# Patient Record
Sex: Female | Born: 1935 | Race: White | Hispanic: No | Smoking: Never smoker
Health system: Southern US, Community
[De-identification: ages and names within clinical notes are randomized; demographics above are authoritative.]

## PROBLEM LIST (undated history)

## (undated) DIAGNOSIS — R0609 Other forms of dyspnea: Secondary | ICD-10-CM

## (undated) DIAGNOSIS — I10 Essential (primary) hypertension: Secondary | ICD-10-CM

## (undated) DIAGNOSIS — I509 Heart failure, unspecified: Secondary | ICD-10-CM

## (undated) HISTORY — PX: APPENDECTOMY: SHX54

## (undated) HISTORY — PX: ABDOMINAL HYSTERECTOMY: SHX81

## (undated) HISTORY — DX: Other forms of dyspnea: R06.09

---

## 1998-01-25 ENCOUNTER — Ambulatory Visit (HOSPITAL_COMMUNITY): Admission: RE | Admit: 1998-01-25 | Discharge: 1998-01-25 | Payer: Self-pay

## 1999-01-31 ENCOUNTER — Ambulatory Visit (HOSPITAL_COMMUNITY): Admission: RE | Admit: 1999-01-31 | Discharge: 1999-01-31 | Payer: Self-pay

## 2000-02-20 ENCOUNTER — Ambulatory Visit (HOSPITAL_COMMUNITY): Admission: RE | Admit: 2000-02-20 | Discharge: 2000-02-20 | Payer: Self-pay | Admitting: Obstetrics & Gynecology

## 2001-02-25 ENCOUNTER — Ambulatory Visit (HOSPITAL_COMMUNITY): Admission: RE | Admit: 2001-02-25 | Discharge: 2001-02-25 | Payer: Self-pay | Admitting: Diagnostic Radiology

## 2001-02-25 ENCOUNTER — Encounter: Payer: Self-pay | Admitting: Internal Medicine

## 2002-03-10 ENCOUNTER — Ambulatory Visit (HOSPITAL_COMMUNITY): Admission: RE | Admit: 2002-03-10 | Discharge: 2002-03-10 | Payer: Self-pay

## 2003-03-16 ENCOUNTER — Encounter: Payer: Self-pay | Admitting: Internal Medicine

## 2003-03-16 ENCOUNTER — Ambulatory Visit (HOSPITAL_COMMUNITY): Admission: RE | Admit: 2003-03-16 | Discharge: 2003-03-16 | Payer: Self-pay | Admitting: *Deleted

## 2015-03-06 ENCOUNTER — Inpatient Hospital Stay (HOSPITAL_COMMUNITY)
Admission: EM | Admit: 2015-03-06 | Discharge: 2015-03-10 | DRG: 291 | Disposition: A | Discharge status: Skilled Nursing Facility | Payer: Medicare HMO | Attending: Internal Medicine | Admitting: Internal Medicine

## 2015-03-06 ENCOUNTER — Encounter (HOSPITAL_COMMUNITY): Payer: Self-pay | Admitting: Emergency Medicine

## 2015-03-06 ENCOUNTER — Emergency Department (HOSPITAL_COMMUNITY): Payer: Medicare HMO

## 2015-03-06 DIAGNOSIS — Z90710 Acquired absence of both cervix and uterus: Secondary | ICD-10-CM | POA: Diagnosis not present

## 2015-03-06 DIAGNOSIS — I129 Hypertensive chronic kidney disease with stage 1 through stage 4 chronic kidney disease, or unspecified chronic kidney disease: Secondary | ICD-10-CM | POA: Diagnosis present

## 2015-03-06 DIAGNOSIS — I340 Nonrheumatic mitral (valve) insufficiency: Secondary | ICD-10-CM | POA: Diagnosis present

## 2015-03-06 DIAGNOSIS — I5031 Acute diastolic (congestive) heart failure: Secondary | ICD-10-CM | POA: Diagnosis not present

## 2015-03-06 DIAGNOSIS — N179 Acute kidney failure, unspecified: Secondary | ICD-10-CM | POA: Diagnosis not present

## 2015-03-06 DIAGNOSIS — J9601 Acute respiratory failure with hypoxia: Secondary | ICD-10-CM | POA: Diagnosis present

## 2015-03-06 DIAGNOSIS — Z515 Encounter for palliative care: Secondary | ICD-10-CM | POA: Insufficient documentation

## 2015-03-06 DIAGNOSIS — E039 Hypothyroidism, unspecified: Secondary | ICD-10-CM | POA: Diagnosis present

## 2015-03-06 DIAGNOSIS — E876 Hypokalemia: Secondary | ICD-10-CM | POA: Diagnosis not present

## 2015-03-06 DIAGNOSIS — R0600 Dyspnea, unspecified: Secondary | ICD-10-CM | POA: Diagnosis not present

## 2015-03-06 DIAGNOSIS — Z7982 Long term (current) use of aspirin: Secondary | ICD-10-CM | POA: Diagnosis not present

## 2015-03-06 DIAGNOSIS — N183 Chronic kidney disease, stage 3 unspecified: Secondary | ICD-10-CM | POA: Diagnosis present

## 2015-03-06 DIAGNOSIS — I421 Obstructive hypertrophic cardiomyopathy: Secondary | ICD-10-CM | POA: Diagnosis not present

## 2015-03-06 DIAGNOSIS — Z888 Allergy status to other drugs, medicaments and biological substances status: Secondary | ICD-10-CM

## 2015-03-06 DIAGNOSIS — D638 Anemia in other chronic diseases classified elsewhere: Secondary | ICD-10-CM | POA: Diagnosis present

## 2015-03-06 DIAGNOSIS — Z79899 Other long term (current) drug therapy: Secondary | ICD-10-CM | POA: Diagnosis not present

## 2015-03-06 DIAGNOSIS — Z66 Do not resuscitate: Secondary | ICD-10-CM | POA: Diagnosis present

## 2015-03-06 DIAGNOSIS — I5033 Acute on chronic diastolic (congestive) heart failure: Secondary | ICD-10-CM | POA: Diagnosis not present

## 2015-03-06 DIAGNOSIS — E785 Hyperlipidemia, unspecified: Secondary | ICD-10-CM | POA: Diagnosis present

## 2015-03-06 DIAGNOSIS — I10 Essential (primary) hypertension: Secondary | ICD-10-CM | POA: Diagnosis present

## 2015-03-06 DIAGNOSIS — Z8249 Family history of ischemic heart disease and other diseases of the circulatory system: Secondary | ICD-10-CM

## 2015-03-06 DIAGNOSIS — J811 Chronic pulmonary edema: Secondary | ICD-10-CM

## 2015-03-06 DIAGNOSIS — I422 Other hypertrophic cardiomyopathy: Secondary | ICD-10-CM | POA: Diagnosis present

## 2015-03-06 DIAGNOSIS — I083 Combined rheumatic disorders of mitral, aortic and tricuspid valves: Secondary | ICD-10-CM | POA: Diagnosis present

## 2015-03-06 DIAGNOSIS — E871 Hypo-osmolality and hyponatremia: Secondary | ICD-10-CM | POA: Diagnosis present

## 2015-03-06 DIAGNOSIS — R079 Chest pain, unspecified: Secondary | ICD-10-CM | POA: Diagnosis not present

## 2015-03-06 DIAGNOSIS — R0602 Shortness of breath: Secondary | ICD-10-CM

## 2015-03-06 DIAGNOSIS — I071 Rheumatic tricuspid insufficiency: Secondary | ICD-10-CM | POA: Diagnosis present

## 2015-03-06 DIAGNOSIS — I350 Nonrheumatic aortic (valve) stenosis: Secondary | ICD-10-CM

## 2015-03-06 HISTORY — DX: Heart failure, unspecified: I50.9

## 2015-03-06 HISTORY — DX: Essential (primary) hypertension: I10

## 2015-03-06 LAB — TROPONIN I
Troponin I: 0.08 ng/mL — ABNORMAL HIGH (ref <0.031)
Troponin I: 0.06 ng/mL — ABNORMAL HIGH (ref <0.031)

## 2015-03-06 LAB — BASIC METABOLIC PANEL
ANION GAP: 11 (ref 5–15)
BUN: 23 mg/dL — AB (ref 6–20)
CALCIUM: 9 mg/dL (ref 8.9–10.3)
CHLORIDE: 90 mmol/L — AB (ref 101–111)
CO2: 20 mmol/L — AB (ref 22–32)
Creatinine, Ser: 1.35 mg/dL — ABNORMAL HIGH (ref 0.44–1.00)
GFR calc Af Amer: 38 mL/min — ABNORMAL LOW (ref >60)
GFR, EST NON AFRICAN AMERICAN: 32 mL/min — AB (ref >60)
GLUCOSE: 124 mg/dL — AB (ref 65–99)
Potassium: 4.5 mmol/L (ref 3.5–5.1)
Sodium: 121 mmol/L — ABNORMAL LOW (ref 135–145)

## 2015-03-06 LAB — CBC
HCT: 26.7 % — ABNORMAL LOW (ref 36.0–46.0)
HEMATOCRIT: 24.6 % — AB (ref 36.0–46.0)
HEMOGLOBIN: 9.4 g/dL — AB (ref 12.0–15.0)
Hemoglobin: 8.5 g/dL — ABNORMAL LOW (ref 12.0–15.0)
MCH: 32.1 pg (ref 26.0–34.0)
MCH: 31.7 pg (ref 26.0–34.0)
MCHC: 35.2 g/dL (ref 30.0–36.0)
MCHC: 34.6 g/dL (ref 30.0–36.0)
MCV: 91.1 fL (ref 78.0–100.0)
MCV: 91.8 fL (ref 78.0–100.0)
PLATELETS: 298 10*3/uL (ref 150–400)
Platelets: 274 10*3/uL (ref 150–400)
RBC: 2.93 MIL/uL — ABNORMAL LOW (ref 3.87–5.11)
RBC: 2.68 MIL/uL — ABNORMAL LOW (ref 3.87–5.11)
RDW: 12.8 % (ref 11.5–15.5)
RDW: 12.9 % (ref 11.5–15.5)
WBC: 9.8 10*3/uL (ref 4.0–10.5)
WBC: 5.9 10*3/uL (ref 4.0–10.5)

## 2015-03-06 LAB — BRAIN NATRIURETIC PEPTIDE: B NATRIURETIC PEPTIDE 5: 370.9 pg/mL — AB (ref 0.0–100.0)

## 2015-03-06 LAB — I-STAT TROPONIN, ED: Troponin i, poc: 0.08 ng/mL (ref 0.00–0.08)

## 2015-03-06 MED ORDER — ONDANSETRON HCL 4 MG/2ML IJ SOLN: 4.0000 mg | Freq: Four times a day (QID) | INTRAMUSCULAR | Status: DC | PRN

## 2015-03-06 MED ORDER — LEVALBUTEROL HCL 0.63 MG/3ML IN NEBU
INHALATION_SOLUTION | RESPIRATORY_TRACT | Status: AC
  Filled 2015-03-06: qty 3

## 2015-03-06 MED ORDER — METOPROLOL TARTRATE 25 MG PO TABS
25.0000 mg | ORAL_TABLET | Freq: Two times a day (BID) | ORAL | Status: DC
  Administered 2015-03-06 – 2015-03-07 (×2): 25 mg via ORAL
  Filled 2015-03-06 (×4): qty 1

## 2015-03-06 MED ORDER — SODIUM CHLORIDE 0.9 % IV SOLN: 250.0000 mL | INTRAVENOUS | Status: DC | PRN

## 2015-03-06 MED ORDER — LEVOTHYROXINE SODIUM 100 MCG PO TABS
100.0000 ug | ORAL_TABLET | Freq: Every day | ORAL | Status: DC
  Filled 2015-03-06 (×2): qty 1

## 2015-03-06 MED ORDER — ATORVASTATIN CALCIUM 20 MG PO TABS
20.0000 mg | ORAL_TABLET | Freq: Every day | ORAL | Status: DC
  Administered 2015-03-07 – 2015-03-10 (×4): 20 mg via ORAL
  Filled 2015-03-06 (×4): qty 1

## 2015-03-06 MED ORDER — AMLODIPINE BESYLATE 10 MG PO TABS
10.0000 mg | ORAL_TABLET | Freq: Two times a day (BID) | ORAL | Status: DC
  Administered 2015-03-06: 10 mg via ORAL
  Filled 2015-03-06: qty 1
  Filled 2015-03-06: qty 2
  Filled 2015-03-06 (×2): qty 1

## 2015-03-06 MED ORDER — ASPIRIN EC 81 MG PO TBEC
81.0000 mg | DELAYED_RELEASE_TABLET | Freq: Every day | ORAL | Status: DC
  Administered 2015-03-07 – 2015-03-10 (×4): 81 mg via ORAL
  Filled 2015-03-06 (×5): qty 1

## 2015-03-06 MED ORDER — SODIUM CHLORIDE 0.9 % IJ SOLN
3.0000 mL | Freq: Two times a day (BID) | INTRAMUSCULAR | Status: DC
  Administered 2015-03-06: 3 mL via INTRAVENOUS

## 2015-03-06 MED ORDER — FUROSEMIDE 10 MG/ML IJ SOLN
40.0000 mg | Freq: Once | INTRAMUSCULAR | Status: AC
  Administered 2015-03-06: 40 mg via INTRAVENOUS
  Filled 2015-03-06: qty 4

## 2015-03-06 MED ORDER — ACETAMINOPHEN 325 MG PO TABS
650.0000 mg | ORAL_TABLET | ORAL | Status: DC | PRN
  Administered 2015-03-06: 650 mg via ORAL
  Filled 2015-03-06: qty 2

## 2015-03-06 MED ORDER — FUROSEMIDE 10 MG/ML IJ SOLN: 40.0000 mg | Freq: Once | INTRAMUSCULAR | Status: DC

## 2015-03-06 MED ORDER — SODIUM CHLORIDE 0.9 % IJ SOLN: 3.0000 mL | INTRAMUSCULAR | Status: DC | PRN

## 2015-03-06 MED ORDER — ENOXAPARIN SODIUM 30 MG/0.3ML ~~LOC~~ SOLN
30.0000 mg | SUBCUTANEOUS | Status: DC
  Administered 2015-03-06 – 2015-03-09 (×4): 30 mg via SUBCUTANEOUS
  Filled 2015-03-06 (×5): qty 0.3

## 2015-03-06 MED ORDER — LEVALBUTEROL HCL 0.63 MG/3ML IN NEBU
0.6300 mg | INHALATION_SOLUTION | Freq: Once | RESPIRATORY_TRACT | Status: AC
  Administered 2015-03-06: 0.63 mg via RESPIRATORY_TRACT
  Filled 2015-03-06: qty 3

## 2015-03-06 MED ORDER — FUROSEMIDE 10 MG/ML IJ SOLN
60.0000 mg | Freq: Once | INTRAMUSCULAR | Status: AC
  Administered 2015-03-06: 60 mg via INTRAVENOUS
  Filled 2015-03-06: qty 6

## 2015-03-06 MED ORDER — NITROGLYCERIN IN D5W 200-5 MCG/ML-% IV SOLN
5.0000 ug/min | Freq: Once | INTRAVENOUS | Status: AC
  Administered 2015-03-06: 5 ug/min via INTRAVENOUS
  Filled 2015-03-06: qty 250

## 2015-03-06 NOTE — ED Notes (Addendum)
Pt son is present - is pt's 89 states pt is DNR and will go home to retreive copy if needed. Pt placed on RA in ED, sats at 93%.

## 2015-03-06 NOTE — ED Notes (Signed)
Per NP Lissa Merlin, stop nitro drip.

## 2015-03-06 NOTE — H&P (Signed)
[facility] History and Physical                                                                                    Tina Weaver, is a 81 y.o. female  MRN: [id number]   DOB - 12/11/1918  Admit Date - 03/06/2015  Outpatient Primary MD for the patient is No primary care provider on file.  Referring MD: Maryan Rued / ER  With History of -  Past Medical History  Diagnosis Date  . CHF (congestive heart failure)   . Hypertension       Past Surgical History  Procedure Laterality Date  . Abdominal hysterectomy      in for   Chief Complaint  Patient presents with  . Shortness of Breath     HPI This is a 81 year old female patient who was discharged from [facility] on 8/6 after being treated for acute diastolic heart failure exacerbation. Patient has underlying chronic kidney disease stage III, grade 1 diastolic dysfunction with moderate concentric hypertrophy and associated mild tricuspid and mitral regurgitation as well as some moderate aortic stenosis. She also has hypothyroidism as well as chronic hyponatremia with baseline sodium reported to be between 126 and 132. During the most recent hospitalization patient presented with hypoxemia and shortness of breath which responded quickly to IV diuresis. Her creatinine increased from a baseline of 1.14 to a peak of 1.99 and therefore diuretics were decreased and her ARB/Lotensin was discontinued prior to discharge. An echocardiogram that had been done earlier in the year on 6/23 (initial diagnosis of CHF) revealed an EF of 65-70% with moderate concentric hypertrophy and moderate aortic stenosis and grade 1 diastolic dysfunction. Preadmission bilateral lower extremity edema had resolved at time of discharge and discharge instructions included monitoring daily weight and dietary adjustments. Patient returns to our ER today complaining of shortness of breath. She has been experiencing progressive dyspnea on exertion, mild increase in  lower extremity edema, orthopnea and paroxysmal nocturnal dyspnea noting she awakened at 2 AM this morning. While she was short of breath she was having chest tightness and discomfort. No other symptoms reported and no constitutional symptoms reported.  In the ER patient was somewhat hypoxemic with sats between 90 and 92% on room air so oxygen was applied. She was afebrile, pulse was 79 respirations 25 and BP was 167/57. Because she did present with some mild chest discomfort in the setting of heart failure low-dose IV nitroglycerin was initiated. She was also given a one-time dose of Lasix IV 40 mg. Her chest x-ray here confirm mild CHF. Patient's symptoms have improved somewhat with administration of oxygen and the Lasix dosage. Laboratory data here revealed a sodium of 121, chloride 90, CO2 20, BUN 23, creatinine 1.35, glucose 124, BNP 370, troponin 0.08, white count 9800, hemoglobin 9.4, platelets 298,000. Her EKG here revealed sinus rhythm with normal QTC and no acute ischemic changes noted.  In further discussion with the family it appears that he received conflicting discharge information. They were given discharge paperwork both regarding hypo-and hypernatremia as well as both hypo-and hyperkalemia. Because of this the family had been limiting the patient's sodium intake and pushing  fluids that included water Gatorade and coffee at least a quart and a half per day.   Review of Systems   In addition to the HPI above,  No Fever-chills, myalgias or other constitutional symptoms No Headache, changes with Vision or hearing, new weakness, tingling, numbness in any extremity, No problems swallowing food or Liquids, indigestion/reflux No Cough or palpitations No Abdominal pain, N/V; no melena or hematochezia, no dark tarry stools, Bowel movements are regular, No dysuria, hematuria or flank pain No new skin rashes, lesions, masses or bruises, No new joints pains-aches No polyuria, polydypsia or  polyphagia,  *A full 10 point Review of Systems was done, except as stated above, all other Review of Systems were negative.  Social History History  Substance Use Topics  . Smoking status: Never Smoker   . Smokeless tobacco: Not on file  . Alcohol Use: No    Resides at: Private residence  Lives with: Family/son  Ambulatory status: Without assistive devices   Family History Mother with heart disease and cancer Father with heart disease   Prior to Admission medications   Medication Sig Start Date End Date Taking? Authorizing Provider  amLODipine (NORVASC) 10 MG tablet Take 10 mg by mouth 2 (two) times daily.   Yes Historical Provider, MD  aspirin EC 81 MG tablet Take 81 mg by mouth daily.   Yes Historical Provider, MD  atorvastatin (LIPITOR) 20 MG tablet Take 20 mg by mouth daily.   Yes Historical Provider, MD  furosemide (LASIX) 20 MG tablet Take 20 mg by mouth daily.   Yes Historical Provider, MD  levothyroxine (SYNTHROID, LEVOTHROID) 100 MCG tablet Take 100 mcg by mouth daily before breakfast.   Yes Historical Provider, MD  metoprolol tartrate (LOPRESSOR) 25 MG tablet Take 25 mg by mouth 2 (two) times daily.   Yes Historical Provider, MD  nitrofurantoin, macrocrystal-monohydrate, (MACROBID) 100 MG capsule Take 100 mg by mouth 2 (two) times daily.    Historical Provider, MD    Allergies  Allergen Reactions  . Fosamax [Alendronate Sodium] Other (See Comments)    "Pain to legs" per pt's family member    Physical Exam  Vitals  Blood pressure 161/67, pulse 79, temperature 98.1 F (36.7 C), temperature source Oral, resp. rate 21, SpO2 96 %.   General:  In no acute distress, appears healthy/younger than stated age and well nourished  Psych:  Normal affect, Denies Suicidal or Homicidal ideations, Awake Alert, Oriented X 3. Speech and thought patterns are clear and appropriate, no apparent short term memory deficits  Neuro:   No focal neurological deficits, CN II through  XII intact, Strength 5/5 all 4 extremities, Sensation intact all 4 extremities.  ENT:  Ears and Eyes appear Normal, Conjunctivae clear, PER. Moist oral mucosa without erythema or exudates.  Neck:  Supple, No lymphadenopathy appreciated  Respiratory:  Symmetrical chest wall movement, Good air movement bilaterally, a few faint bibasilar crackles. 2 L  Cardiac:  RRR, soft systolic murmur left sternal border fourth intercostal space adjacent to sternum also a tighter systolic murmur left sternal border fifth intercostal space more lateral, 2+ bilateral LE edema noted, no JVD, No carotid bruits, peripheral pulses palpable at 2+  Abdomen:  Positive bowel sounds, Soft, Non tender, somewhat distended,  No masses appreciated, no obvious hepatosplenomegaly  Skin:  No Cyanosis, Normal Skin Turgor, No Skin Rash or Bruise.  Extremities: Symmetrical without obvious trauma or injury,  no effusions.  Data Review  CBC  Recent Labs Lab 03/06/15 1038  WBC 9.8  HGB 9.4*  HCT 26.7*  PLT 298  MCV 91.1  MCH 32.1  MCHC 35.2  RDW 12.8    Chemistries   Recent Labs Lab 03/06/15 1038  NA 121*  K 4.5  CL 90*  CO2 20*  GLUCOSE 124*  BUN 23*  CREATININE 1.35*  CALCIUM 9.0    CrCl cannot be calculated (Unknown ideal weight.).  No results for input(s): TSH, T4TOTAL, T3FREE, THYROIDAB in the last 72 hours.  Invalid input(s): FREET3  Coagulation profile No results for input(s): INR, PROTIME in the last 168 hours.  No results for input(s): DDIMER in the last 72 hours.  Cardiac Enzymes No results for input(s): CKMB, TROPONINI, MYOGLOBIN in the last 168 hours.  Invalid input(s): CK  Invalid input(s): POCBNP  Urinalysis No results found for: COLORURINE, APPEARANCEUR, LABSPEC, PHURINE, GLUCOSEU, HGBUR, BILIRUBINUR, KETONESUR, PROTEINUR, UROBILINOGEN, NITRITE, LEUKOCYTESUR  Imaging results:   Dg Chest 2 View  03/06/2015   CLINICAL DATA:  Shortness of breath, CHF, hypertension  EXAM:  CHEST  2 VIEW  COMPARISON:  02/27/2015  FINDINGS: Bilateral diffuse mild interstitial thickening. Small left pleural effusion. Trace right pleural effusion. No pneumothorax. No focal consolidation. Stable cardiomegaly. Enlargement of the central pulmonary vasculature. No acute osseous abnormality.  IMPRESSION: Findings most concerning for mild CHF.   Electronically Signed   By: Kathreen Devoid   On: 03/06/2015 11:41     EKG: (Independently reviewed) sinus rhythm with likely old anterior infarct, borderline widened QRS, QTC 453 ms, no acute ischemic changes   Assessment & Plan   Acute respiratory failure with hypoxia /Acute diastolic heart failure secondary to hypertrophic cardiomyopathy -Admit to telemetry -Routine adult CHF admission orders -Echocardiogram completed at outside facility on 6/23 at time of initial diagnosis of heart failure in June 2016 -We will give an additional dose of Lasix this evening at 1700 and monitor closely since patient historically/during recent admission quickly bumped creatinine with attempts at diuresis -Because of chest tightness or to admission will cycle troponin as precaution -DHF so dc IV NTG -GFR low so have increased second dosage to 60 mg of Lasix -Strict I/O and daily weights -Suspect somewhat precipitated by excess free water intake at home after discharge -ARB on hold secondary to chronic kidney disease -Since diastolic heart failure continue beta blocker -Potassium 4.5 so monitored before giving replacement  -Be cautious with diuresis given underlying moderate aortic stenosis -Repeat labs in a.m. -1 stable from respiratory standpoint will need PT/OT evaluation and early mobilization    Acute on chronic hyponatremia -Baseline sodium reported to be between 126 and 132 -Currently patient mentating very well with sodium of 121 and no tremulousness -Suspect excess free water intake as well as CHF exacerbation contributing -Repeat labs in a.m.    CKD  (chronic kidney disease), stage III -Baseline creatinine unclear noting that when she presented during last hospitalization with frank volume overload BUN was 18 and creatinine was 1.19 -At time of discharge on 8/6 BUN was 29 and creatinine was 1.45 -Continue to follow with diuresis and monitor for worsening renal function - Renal ultrasound 01/31/2015 without hydronephrosis or renal thinning    Mild tricuspid regurgitation/Moderate mitral regurgitation -Likely secondary to hypertrophic cardiomyopathy    Moderate aortic stenosis -No reports of syncope so use caution with diuresis    Hypothyroidism -Continue preadmission levothyroxine    HTN  -On Norvasc, Lasix and Lopressor prior to admission -Norvasc could be contributing to patient's lower extremity edema so consider alternative agent -Lopressor  also not adequate for hypertensive control and primarily utilized for rate control in patients with tachyarrhythmias -Since underlying chronic kidney disease and not a candidate for ACE inhibitor or ARB consider addition of hydralazine    Anemia of chronic disease -Anemia panel and workup completed at outside facility    Dyslipidemia -Continue Lipitor  DVT Prophylaxis: Lovenox renal dose adjusted  Family Communication:   Son and multiple family members at bedside  Code Status:  DO NOT RESUSCITATE  Condition:  Stable  Discharge disposition: Anticipate discharge to home once heart failure symptoms compensated  Time spent in minutes : 60      ELLIS,ALLISON L. ANP on 03/06/2015 at 1:51 PM  Between 7am to 7pm - Pager - [phone]  After 7pm go to [email or web address] - password [email or web address]  And look for the night coverage person covering me after hours  [facility]  I have personally examined this patient and reviewed the entire database. I have reviewed the above note, made any necessary editorial changes, and agree with its content.  At the time of my evaluation the patient  is resting much more comfortably.  She states her shortness of breath is not yet back to baseline but that it has in fact improved.  She denies chest pain nausea vomiting or abdominal pain.  Physical exam reveals mild diffuse crackles with occasional expiratory wheezes.  Cardiac eval is remarkable for lower extremity edema.  We will gently diurese her and follow her renal function very closely.  I agree with the plan as detailed above.    Cherene Altes, MD [facility]

## 2015-03-06 NOTE — ED Provider Notes (Signed)
CSN: [id number]     Arrival date & time 03/06/15  1018 History   First MD Initiated Contact with Patient 03/06/15 1041     Chief Complaint  Patient presents with  . Shortness of Breath     (Consider location/radiation/quality/duration/timing/severity/associated sxs/prior Treatment) HPI Tina Weaver is a 81 y.o. female with a history of CHF and hypertension comes in for evaluation of shortness of breath. Patient is accompanied by son who reports patient has new diagnosis of CHF within the past 6 weeks, was hospitalized on Tuesday through Saturday at [facility] for CHF. Patient has been worsening since discharge becoming more short of breath. Patient reports chest pain that woke her up last night at 2:00 AM. She cannot characterize the pain but states that it was substernal, lasted 30 minutes and resolved on its own. She denies any associated nausea, vomiting or diaphoresis. No fevers/chills or cough. She reports associated shortness of breath now with increased peripheral edema.   Past Medical History  Diagnosis Date  . CHF (congestive heart failure)   . Hypertension    Past Surgical History  Procedure Laterality Date  . Abdominal hysterectomy     History reviewed. No pertinent family history. History  Substance Use Topics  . Smoking status: Never Smoker   . Smokeless tobacco: Not on file  . Alcohol Use: No   OB History    No data available     Review of Systems A 10 point review of systems was completed and was negative except for pertinent positives and negatives as mentioned in the history of present illness     Allergies  Review of patient's allergies indicates not on file.  Home Medications   Prior to Admission medications   Not on File   BP 164/58 mmHg  Pulse 76  Temp(Src) 98.1 F (36.7 C) (Oral)  Resp 20  SpO2 94% Physical Exam  Constitutional: She is oriented to person, place, and time. She appears well-developed and well-nourished.  HENT:  Head:  Normocephalic and atraumatic.  Mouth/Throat: Oropharynx is clear and moist.  Eyes: Conjunctivae are normal. Pupils are equal, round, and reactive to light. Right eye exhibits no discharge. Left eye exhibits no discharge. No scleral icterus.  Neck: Neck supple.  Cardiovascular: Normal rate, regular rhythm and normal heart sounds.   Pulmonary/Chest: Effort normal and breath sounds normal. No respiratory distress. She has no wheezes. She has no rales.  Crackles bilaterally in lower and mid lung fields. Patient is dyspneic and has difficulty carrying on conversation. Hypoxic to 92% on room air.  Abdominal: Soft. There is no tenderness.  Musculoskeletal: Normal range of motion. She exhibits no tenderness.  Bilateral lower extremities mildly edematous without any pitting edema.  Neurological: She is alert and oriented to person, place, and time.  Cranial Nerves II-XII grossly intact  Skin: Skin is warm and dry. No rash noted.  Psychiatric: She has a normal mood and affect.  Nursing note and vitals reviewed.   ED Course  Procedures (including critical care time) Labs Review Labs Reviewed  CBC - Abnormal; Notable for the following:    RBC 2.93 (*)    Hemoglobin 9.4 (*)    HCT 26.7 (*)    All other components within normal limits  BASIC METABOLIC PANEL  BRAIN NATRIURETIC PEPTIDE  I-STAT TROPOININ, ED    Imaging Review Dg Chest 2 View  03/06/2015   CLINICAL DATA:  Shortness of breath, CHF, hypertension  EXAM: CHEST  2 VIEW  COMPARISON:  02/27/2015  FINDINGS: Bilateral diffuse mild interstitial thickening. Small left pleural effusion. Trace right pleural effusion. No pneumothorax. No focal consolidation. Stable cardiomegaly. Enlargement of the central pulmonary vasculature. No acute osseous abnormality.  IMPRESSION: Findings most concerning for mild CHF.   Electronically Signed   By: Kathreen Devoid   On: 03/06/2015 11:41     EKG Interpretation   Date/Time:  Tuesday March 06 2015 10:31:04  EDT Ventricular Rate:  75 PR Interval:  182 QRS Duration: 96 QT Interval:  406 QTC Calculation: 453 R Axis:   19 Text Interpretation:  Sinus rhythm Probable anterior infarct, old  Nonspecific T abnormalities, lateral leads No previous tracing Confirmed  by Maryan Rued  MD, Loree Fee ([id number]) on 03/06/2015 10:37:04 AM      MDM  Patient here for evaluation of shortness of breath likely secondary to CHF exacerbation. Patient is hypoxic on room air at 92%, requires 2 L nasal cannula nor to maintain saturations at 98%. Patient is not on oxygen at home. Vital signs otherwise stable. She is dyspneic with conversation. No pitting edema on exam. On lung auscultation she has bilateral crackles. BNP 370, initial troponin negative, hyponatremic at 121 (will not give fluids given CHF status), labs otherwise baseline for patient. Chest x-ray shows bilateral diffuse interstitial thickening with bilateral small pleural effusions, most consistent with mild CHF. Patient started on nitro drip, given 40 IV Lasix. We'll need subsequent medical admission for further evaluation and management of CHF. Discussed and reviewed this case with my attending, Dr. Maryan Rued who also saw and evaluated the patient and agrees with plan for medical admission. Spoke with hospitalist, Erin Hearing to see in the ED. Patient admitted. Final diagnoses:  Acute diastolic heart failure secondary to hypertrophic cardiomyopathy  Pulmonary edema        Comer Locket, PA-C 03/07/15 [id number]  Blanchie Dessert, MD 03/12/15 2136

## 2015-03-06 NOTE — Progress Notes (Signed)
RN paged- pt with reports of SOB- O2 sat 92% on oxygen- noted with wheezing. Was given Lasix 60 mg IV x1 as ordered at 5p but has not yet voided. Will try Xopenex neb in event she has underlying restrictive airway dz- may need additional Lasix but will need to administer with caution since renal function already abnormal and has moderate Ao stenosis. Will go ahead and increase O2 to 50% VM  Erin Hearing, ANP

## 2015-03-06 NOTE — ED Notes (Addendum)
Pt to ED via [facility] with complaint of shortness of breath without chest pain since last night - released from hospital on Saturday with recent diagnosis of CHF; family states that yesterday pt cut off thermostat, on arrival by EMS thermostat was on 82. Initially pt was diminished. BP 166/86, 99% on NRB, HR @76  bpm and regular; pt took home meds 30 minutes prior to EMS arrival. 12 lead unremarkable. A/ox 4.

## 2015-03-07 ENCOUNTER — Encounter (HOSPITAL_COMMUNITY): Payer: Self-pay | Admitting: *Deleted

## 2015-03-07 ENCOUNTER — Inpatient Hospital Stay (HOSPITAL_COMMUNITY): Payer: Medicare HMO

## 2015-03-07 DIAGNOSIS — E871 Hypo-osmolality and hyponatremia: Secondary | ICD-10-CM

## 2015-03-07 DIAGNOSIS — D638 Anemia in other chronic diseases classified elsewhere: Secondary | ICD-10-CM

## 2015-03-07 LAB — COMPREHENSIVE METABOLIC PANEL
ALK PHOS: 48 U/L (ref 38–126)
ALT: 21 U/L (ref 14–54)
AST: 22 U/L (ref 15–41)
Albumin: 3.2 g/dL — ABNORMAL LOW (ref 3.5–5.0)
Anion gap: 11 (ref 5–15)
BILIRUBIN TOTAL: 0.5 mg/dL (ref 0.3–1.2)
BUN: 25 mg/dL — ABNORMAL HIGH (ref 6–20)
CHLORIDE: 97 mmol/L — AB (ref 101–111)
CO2: 24 mmol/L (ref 22–32)
Calcium: 8.9 mg/dL (ref 8.9–10.3)
Creatinine, Ser: 1.37 mg/dL — ABNORMAL HIGH (ref 0.44–1.00)
GFR, EST AFRICAN AMERICAN: 37 mL/min — AB (ref >60)
GFR, EST NON AFRICAN AMERICAN: 32 mL/min — AB (ref >60)
GLUCOSE: 90 mg/dL (ref 65–99)
POTASSIUM: 3.5 mmol/L (ref 3.5–5.1)
SODIUM: 132 mmol/L — AB (ref 135–145)
Total Protein: 6.5 g/dL (ref 6.5–8.1)

## 2015-03-07 LAB — TROPONIN I: Troponin I: 0.04 ng/mL — ABNORMAL HIGH (ref <0.031)

## 2015-03-07 LAB — BRAIN NATRIURETIC PEPTIDE: B Natriuretic Peptide: 181.7 pg/mL — ABNORMAL HIGH (ref 0.0–100.0)

## 2015-03-07 MED ORDER — FUROSEMIDE 10 MG/ML IJ SOLN
40.0000 mg | Freq: Two times a day (BID) | INTRAMUSCULAR | Status: DC
  Administered 2015-03-07 – 2015-03-08 (×3): 40 mg via INTRAVENOUS
  Filled 2015-03-07 (×6): qty 4

## 2015-03-07 MED ORDER — POTASSIUM CHLORIDE CRYS ER 20 MEQ PO TBCR
20.0000 meq | EXTENDED_RELEASE_TABLET | Freq: Every day | ORAL | Status: DC
  Administered 2015-03-07 – 2015-03-08 (×2): 20 meq via ORAL
  Filled 2015-03-07 (×3): qty 1

## 2015-03-07 MED ORDER — ALPRAZOLAM 0.25 MG PO TABS
0.2500 mg | ORAL_TABLET | Freq: Two times a day (BID) | ORAL | Status: DC | PRN
  Administered 2015-03-07: 0.25 mg via ORAL
  Filled 2015-03-07: qty 1

## 2015-03-07 MED ORDER — METHYLPREDNISOLONE SODIUM SUCC 125 MG IJ SOLR
60.0000 mg | Freq: Once | INTRAMUSCULAR | Status: AC
  Administered 2015-03-07: 60 mg via INTRAVENOUS
  Filled 2015-03-07: qty 2

## 2015-03-07 MED ORDER — FUROSEMIDE 10 MG/ML IJ SOLN
40.0000 mg | Freq: Once | INTRAMUSCULAR | Status: AC
  Administered 2015-03-07: 40 mg via INTRAVENOUS

## 2015-03-07 MED ORDER — IPRATROPIUM-ALBUTEROL 0.5-2.5 (3) MG/3ML IN SOLN
3.0000 mL | Freq: Four times a day (QID) | RESPIRATORY_TRACT | Status: DC
  Administered 2015-03-08: 3 mL via RESPIRATORY_TRACT
  Filled 2015-03-07 (×2): qty 3

## 2015-03-07 MED ORDER — LEVOTHYROXINE SODIUM 100 MCG PO TABS
100.0000 ug | ORAL_TABLET | Freq: Every day | ORAL | Status: DC
  Administered 2015-03-07 – 2015-03-10 (×4): 100 ug via ORAL
  Filled 2015-03-07 (×5): qty 1

## 2015-03-07 MED ORDER — METOPROLOL TARTRATE 25 MG PO TABS
25.0000 mg | ORAL_TABLET | Freq: Two times a day (BID) | ORAL | Status: DC
  Filled 2015-03-07: qty 1

## 2015-03-07 MED ORDER — ALBUTEROL SULFATE (2.5 MG/3ML) 0.083% IN NEBU
2.5000 mg | INHALATION_SOLUTION | RESPIRATORY_TRACT | Status: DC | PRN
  Administered 2015-03-07: 2.5 mg via RESPIRATORY_TRACT
  Filled 2015-03-07: qty 3

## 2015-03-07 MED ORDER — METOPROLOL TARTRATE 12.5 MG HALF TABLET
12.5000 mg | ORAL_TABLET | Freq: Two times a day (BID) | ORAL | Status: DC
  Filled 2015-03-07: qty 1

## 2015-03-07 MED ORDER — IPRATROPIUM-ALBUTEROL 0.5-2.5 (3) MG/3ML IN SOLN: 3.0000 mL | Freq: Three times a day (TID) | RESPIRATORY_TRACT | Status: DC

## 2015-03-07 NOTE — Progress Notes (Signed)
Informed by RN-that patient is sob. I had seen the patient 1-2 hours back-she seemed comfortable.  On exam-in mild to mod distress-with wheezing, rales  Suspect Pul Edema Plan:d/w 2 sons at bedside-offered to continue with IV Lasix, nebs and follow or transfer to SDU for BiPAP. Patient Is a DNR. They at this time wish we continue with current treatment-she has just been given Lasix-if no improvement in the next few hours-then they are open to transfer to SDU for BiPAP. I will go ahead and stop her Metoprolol for now as well. Will follow.

## 2015-03-07 NOTE — Progress Notes (Signed)
PATIENT DETAILS Name: Tina Weaver Age: 81 y.o. Sex: female Date of Birth: [birth year] Admit Date: 03/06/2015 Admitting Physician Cherene Altes, MD PCP:No primary care provider on file.  Subjective: Short of breath-wheezing. But feels better than the past few days  Assessment/Plan: Principal Problem: Acute diastolic heart failure: We will restart intravenous Lasix-as short of breath. Decrease metoprolol to 12.5 mg twice a day, stop amlodipine. Recent echocardiogram done at [facility]-shows EF around 65-70% with mild-to-moderate aortic stenosis. Follow clinical course, family aware that apart from diuretics-we will not escalate care. If any further deterioration we will consult palliative care  Active Problems: Acute hypoxic respiratory failure: Secondary to above, reviewed notes in care everywhere-lower extremity Doppler on 02/28/15 negative for DVT. Continue diuresis and follow. DNR  Suspected chronic kidney disease stage III: Creatinine close to usual baseline. Continue IV Lasix, discussed with family regarding the potential for worsening creatinine-however currently short of breath and wheezing. We will diureses currently and might have to accept a higher level of creatinine.  Hyponatremia: Secondary to hypervolemia. On IV Lasix. Follow  Anemia: Likely secondary to heart disease. No indication for transfusion-follow  Hypothyroidism: Continue levothyroxine.  Mild to moderate aortic stenosis: Outpatient follow-up. Doubt candidate for any further workup/intervention  Palliative care: DNR in place-reconfirmed with family. Family aware that patient is not a candidate for aggressive care. Currently goals are to diurese and see if any improvement, if any further deterioration, may need palliative care and transitioning to hospice/comfort care.  Disposition: Remain inpatient-SNF vs Home hospice in then ext few days  Antimicrobial agents  See  below  Anti-infectives    None      DVT Prophylaxis: Prophylactic Lovenox   Code Status: DNR  Family Communication 2 sons-daughter-in-law at bedside  Procedures: None  CONSULTS:  None  Time spent 30 minutes-Greater than 50% of this time was spent in counseling, explanation of diagnosis, planning of further management, and coordination of care.  MEDICATIONS: Scheduled Meds: . aspirin EC  81 mg Oral Daily  . atorvastatin  20 mg Oral Daily  . enoxaparin (LOVENOX) injection  30 mg Subcutaneous Q24H  . furosemide  40 mg Intravenous Q12H  . levothyroxine  100 mcg Oral QAC breakfast  . metoprolol tartrate  12.5 mg Oral BID  . potassium chloride  20 mEq Oral Daily   Continuous Infusions:  PRN Meds:.acetaminophen, albuterol, ondansetron (ZOFRAN) IV    PHYSICAL EXAM: Vital signs in last 24 hours: Filed Vitals:   03/07/15 0100 03/07/15 0434 03/07/15 0830 03/07/15 0934  BP: 139/40 154/48  133/63  Pulse: 62 76 72 73  Temp:  97.6 F (36.4 C)  99.3 F (37.4 C)  TempSrc:  Oral  Oral  Resp: 18 18    Height:      Weight:  62.4 kg (137 lb 9.1 oz)    SpO2: 98% 98%  98%    Weight change:  Filed Weights   03/06/15 1449 03/07/15 0434  Weight: 63.005 kg (138 lb 14.4 oz) 62.4 kg (137 lb 9.1 oz)   Body mass index is 27.77 kg/(m^2).   Gen Exam: Awake-tachypneic-in mild distress. Neck: Supple Chest: Bibasilar rales-some expiratory wheezing CVS: S1 S2 Regular Abdomen: soft, BS +, non tender, non distended. Extremities: no edema, lower extremities warm to touch. Neurologic: Non Focal.  Skin: No Rash.   Wounds: N/A.   Intake/Output from previous day:  Intake/Output Summary (Last 24 hours)  at 03/07/15 1332 Last data filed at 03/07/15 1231  Gross per 24 hour  Intake    820 ml  Output   2200 ml  Net  -1380 ml     LAB RESULTS: CBC  Recent Labs Lab 03/06/15 1038 03/06/15 2237  WBC 9.8 5.9  HGB 9.4* 8.5*  HCT 26.7* 24.6*  PLT 298 274  MCV 91.1 91.8  MCH 32.1  31.7  MCHC 35.2 34.6  RDW 12.8 12.9    Chemistries   Recent Labs Lab 03/06/15 1038 03/07/15 0557  NA 121* 132*  K 4.5 3.5  CL 90* 97*  CO2 20* 24  GLUCOSE 124* 90  BUN 23* 25*  CREATININE 1.35* 1.37*  CALCIUM 9.0 8.9    CBG: No results for input(s): GLUCAP in the last 168 hours.  GFR Estimated Creatinine Clearance: 20.2 mL/min (by C-G formula based on Cr of 1.37).  Coagulation profile No results for input(s): INR, PROTIME in the last 168 hours.  Cardiac Enzymes  Recent Labs Lab 03/06/15 1752 03/06/15 2237 03/07/15 0601  TROPONINI 0.08* 0.06* 0.04*    Invalid input(s): POCBNP No results for input(s): DDIMER in the last 72 hours. No results for input(s): HGBA1C in the last 72 hours. No results for input(s): CHOL, HDL, LDLCALC, TRIG, CHOLHDL, LDLDIRECT in the last 72 hours. No results for input(s): TSH, T4TOTAL, T3FREE, THYROIDAB in the last 72 hours.  Invalid input(s): FREET3 No results for input(s): VITAMINB12, FOLATE, FERRITIN, TIBC, IRON, RETICCTPCT in the last 72 hours. No results for input(s): LIPASE, AMYLASE in the last 72 hours.  Urine Studies No results for input(s): UHGB, CRYS in the last 72 hours.  Invalid input(s): UACOL, UAPR, USPG, UPH, UTP, UGL, UKET, UBIL, UNIT, UROB, ULEU, UEPI, UWBC, URBC, UBAC, CAST, UCOM, BILUA  MICROBIOLOGY: No results found for this or any previous visit (from the past 240 hour(s)).  RADIOLOGY STUDIES/RESULTS: Dg Chest 2 View  03/06/2015   CLINICAL DATA:  Shortness of breath, CHF, hypertension  EXAM: CHEST  2 VIEW  COMPARISON:  02/27/2015  FINDINGS: Bilateral diffuse mild interstitial thickening. Small left pleural effusion. Trace right pleural effusion. No pneumothorax. No focal consolidation. Stable cardiomegaly. Enlargement of the central pulmonary vasculature. No acute osseous abnormality.  IMPRESSION: Findings most concerning for mild CHF.   Electronically Signed   By: Kathreen Devoid   On: 03/06/2015 11:41   Dg Chest  Port 1 View  03/07/2015   CLINICAL DATA:  Shortness of breath and pulmonary edema.  EXAM: PORTABLE CHEST - 1 VIEW  COMPARISON:  03/06/2015  FINDINGS: Heart size is upper limits of normal but unchanged. Stable blunting at the left costophrenic angle suggests a small pleural effusion with atelectasis. There is mild central peribronchial thickening which could represent mild interstitial edema.  IMPRESSION: Slightly improved lung aeration compared to the prior examination. Small left pleural effusion and question mild interstitial edema.   Electronically Signed   By: Markus Daft M.D.   On: 03/07/2015 07:37    Oren Binet, MD  [facility] Pager:[phone]  If 7PM-7AM, please contact night-coverage [email or web address] Password [email or web address] 03/07/2015, 1:32 PM   LOS: 1 day

## 2015-03-07 NOTE — Progress Notes (Signed)
Md called patient very sob lungs tight with wheezing. orders received , rest therapy called.

## 2015-03-07 NOTE — Evaluation (Signed)
Physical Therapy Evaluation Patient Details Name: EMARIE PAUL MRN: [id number] DOB: 30-Oct-1918 Today's Date: 03/07/2015   History of Present Illness  81 yo female with onset of acute respiratory failure and pulm edema was admitted, noted also elevated troponin, hypoxia, valve regurg, aortic stenosis  Clinical Impression  Pt was assessed and noted her limited tolerance for standing and gait, slightly SOB and fatigued but vitals stable.  Her PLOF was very high and so noted with such a decline SNF would be desirable for recovering independence.  WIll continue to see for gait and balance to restore as much safe independent movement as possible.    Follow Up Recommendations SNF    Equipment Recommendations  Rolling walker with 5" wheels    Recommendations for Other Services       Precautions / Restrictions Precautions Precautions: Fall Restrictions Weight Bearing Restrictions: No      Mobility  Bed Mobility Overal bed mobility: Needs Assistance Bed Mobility: Supine to Sit;Sit to Supine     Supine to sit: Mod assist Sit to supine: Mod assist;Min assist   General bed mobility comments: using hand rail to get out and in  Transfers Overall transfer level: Needs assistance Equipment used: Rolling walker (2 wheeled);1 person hand held assist Transfers: Sit to/from [facility] Sit to Stand: Min assist Stand pivot transfers: Min assist       General transfer comment: uses good hand placement and struggles to contrl walker  Ambulation/Gait Ambulation/Gait assistance: Min assist Ambulation Distance (Feet): 90 Feet Assistive device: Rolling walker (2 wheeled);1 person hand held assist Gait Pattern/deviations: Step-through pattern;Decreased stride length;Wide base of support;Trunk flexed;Drifts right/left Gait velocity: reduced Gait velocity interpretation: Below normal speed for age/gender General Gait Details: struggles to control walker in turns and to steer,  tends to let go near bed  Stairs            Wheelchair Mobility    Modified Rankin (Stroke Patients Only)       Balance Overall balance assessment: Needs assistance Sitting-balance support: Feet supported Sitting balance-Leahy Scale: Fair     Standing balance support: Bilateral upper extremity supported Standing balance-Leahy Scale: Poor                               Pertinent Vitals/Pain Pain Assessment: No/denies pain    Home Living Family/patient expects to be discharged to:: Private residence Living Arrangements: Alone Available Help at Discharge: Family;Available PRN/intermittently Type of Home: House Home Access: Stairs to enter     Home Layout: One level Home Equipment: None      Prior Function Level of Independence: Independent         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 [employer]     Hand Dominance        Extremity/Trunk Assessment   Upper Extremity Assessment: Generalized weakness           Lower Extremity Assessment: Generalized weakness      Cervical / Trunk Assessment: Kyphotic  Communication   Communication: No difficulties  Cognition Arousal/Alertness: Awake/alert Behavior During Therapy: WFL for tasks assessed/performed Overall Cognitive Status: Within Functional Limits for tasks assessed                      General Comments General comments (skin integrity, edema, etc.): Pt is fragile looking and has mult areas of bruising and skin changefrom IV's and moving her in  hospital.      Exercises        Assessment/Plan    PT Assessment Patient needs continued PT services  PT Diagnosis Generalized weakness   PT Problem List Decreased strength;Decreased range of motion;Decreased activity tolerance;Decreased balance;Decreased mobility;Decreased coordination;Decreased knowledge of use of DME;Decreased safety awareness;Cardiopulmonary status limiting activity;Decreased skin  integrity  PT Treatment Interventions DME instruction;Gait training;Functional mobility training;Therapeutic activities;Therapeutic exercise;Balance training;Neuromuscular re-education;Patient/family education   PT Goals (Current goals can be found in the Care Plan section) Acute Rehab PT Goals Patient Stated Goal: to go home and get back to usual activity PT Goal Formulation: With patient/family Time For Goal Achievement: 03/21/15 Potential to Achieve Goals: Good    Frequency Min 2X/week   Barriers to discharge Decreased caregiver support pulses up to 91 from baseline of 73 with gait    Co-evaluation               End of Session Equipment Utilized During Treatment: Gait belt;Oxygen Activity Tolerance: Patient tolerated treatment well;Patient limited by fatigue Patient left: in bed;with call bell/phone within reach;with family/visitor present Nurse Communication: Mobility status         Time: 8828-0034 PT Time Calculation (min) (ACUTE ONLY): 31 min   Charges:   PT Evaluation $Initial PT Evaluation Tier I: 1 Procedure PT Treatments $Gait Training: 8-22 mins   PT G CodesRamond Dial 03-29-2015, 4:35 PM   Mee Hives, PT MS Acute Rehab Dept. Number: [facility] [id number] and [employer] [phone]

## 2015-03-08 ENCOUNTER — Inpatient Hospital Stay (HOSPITAL_COMMUNITY): Payer: Medicare HMO

## 2015-03-08 DIAGNOSIS — R079 Chest pain, unspecified: Secondary | ICD-10-CM

## 2015-03-08 LAB — BASIC METABOLIC PANEL
Anion gap: 13 (ref 5–15)
BUN: 34 mg/dL — ABNORMAL HIGH (ref 6–20)
CALCIUM: 9.1 mg/dL (ref 8.9–10.3)
CO2: 25 mmol/L (ref 22–32)
Chloride: 97 mmol/L — ABNORMAL LOW (ref 101–111)
Creatinine, Ser: 1.48 mg/dL — ABNORMAL HIGH (ref 0.44–1.00)
GFR calc Af Amer: 34 mL/min — ABNORMAL LOW (ref >60)
GFR calc non Af Amer: 29 mL/min — ABNORMAL LOW (ref >60)
Glucose, Bld: 169 mg/dL — ABNORMAL HIGH (ref 65–99)
Potassium: 3.4 mmol/L — ABNORMAL LOW (ref 3.5–5.1)
SODIUM: 135 mmol/L (ref 135–145)

## 2015-03-08 MED ORDER — TECHNETIUM TO 99M ALBUMIN AGGREGATED
5.8000 | Freq: Once | INTRAVENOUS | Status: AC | PRN
  Administered 2015-03-08: 6 via INTRAVENOUS

## 2015-03-08 MED ORDER — IPRATROPIUM-ALBUTEROL 0.5-2.5 (3) MG/3ML IN SOLN
3.0000 mL | Freq: Three times a day (TID) | RESPIRATORY_TRACT | Status: DC
  Administered 2015-03-08 – 2015-03-10 (×5): 3 mL via RESPIRATORY_TRACT
  Filled 2015-03-08 (×7): qty 3

## 2015-03-08 MED ORDER — TECHNETIUM TC 99M DIETHYLENETRIAME-PENTAACETIC ACID: 37.7000 | Freq: Once | INTRAVENOUS | Status: DC | PRN

## 2015-03-08 MED ORDER — FUROSEMIDE 10 MG/ML IJ SOLN
40.0000 mg | Freq: Three times a day (TID) | INTRAMUSCULAR | Status: DC
  Administered 2015-03-08 – 2015-03-09 (×3): 40 mg via INTRAVENOUS
  Filled 2015-03-08 (×4): qty 4

## 2015-03-08 NOTE — Progress Notes (Signed)
UR completed 

## 2015-03-08 NOTE — Progress Notes (Signed)
PATIENT DETAILS Name: Tina Weaver Age: 81 y.o. Sex: female Date of Birth: [birth year] Admit Date: 03/06/2015 Admitting Physician Cherene Altes, MD PCP:No primary care provider on file.  Subjective: Still short of breath-but much better than yesterday evening.  Assessment/Plan: Principal Problem: Acute diastolic heart failure: Continue intravenous Lasix-changed to every 8 hours. Since continues to have shortness of breath in spite of being almost 3 L negative balance and down to 131 pounds (138 pounds on admission) and without any signs of overt CHF on exam-repeat echocardiogram today. Follow closely.  Active Problems: Acute hypoxic respiratory failure: Secondary to above, reviewed notes in care everywhere-lower extremity Doppler on 02/28/15 negative for DVT. Unfortunately, although somewhat better continues to be short of breath-check VQ scan. Follow clinically-remains a DO NOT RESUSCITATE.  Suspected chronic kidney disease stage III: Creatinine close to usual baseline. Continue IV Lasix, discussed with family regarding the potential for worsening creatinine-follow electrolytes closely.  Hyponatremia: Secondary to hypervolemia. Resolved with IV Lasix. Follow  Anemia: Likely secondary to chronic disease. No indication for transfusion-follow  Hypothyroidism: Continue levothyroxine.  Mild to moderate aortic stenosis: Await repeat echocardiogram. Doubt candidate for any further workup/intervention  Palliative care: DNR in place-reconfirmed with family. Family aware that patient is not a candidate for aggressive care. Currently goals are to diurese and see if any improvement, if any further deterioration, may need palliative care and transitioning to hospice/comfort care. Have consulted palliative care  Disposition: Remain inpatient-SNF vs Home with hospice in then ext few days  Antimicrobial agents  See below  Anti-infectives    None      DVT  Prophylaxis: Prophylactic Lovenox   Code Status: DNR  Family Communication 2 sons-daughter-in-law at bedside  Procedures: None  CONSULTS:  None  Time spent 30 minutes-Greater than 50% of this time was spent in counseling, explanation of diagnosis, planning of further management, and coordination of care.  MEDICATIONS: Scheduled Meds: . aspirin EC  81 mg Oral Daily  . atorvastatin  20 mg Oral Daily  . enoxaparin (LOVENOX) injection  30 mg Subcutaneous Q24H  . furosemide  40 mg Intravenous 3 times per day  . ipratropium-albuterol  3 mL Nebulization TID  . levothyroxine  100 mcg Oral QAC breakfast  . potassium chloride  20 mEq Oral Daily   Continuous Infusions:  PRN Meds:.acetaminophen, albuterol, ALPRAZolam, ondansetron (ZOFRAN) IV    PHYSICAL EXAM: Vital signs in last 24 hours: Filed Vitals:   03/08/15 0142 03/08/15 0253 03/08/15 0529 03/08/15 0819  BP: 140/45  142/42   Pulse: 79  81   Temp: 97.9 F (36.6 C)  98.1 F (36.7 C)   TempSrc: Oral  Oral   Resp: 16  18   Height:      Weight:   59.6 kg (131 lb 6.3 oz)   SpO2: 96% 97% 96% 96%    Weight change: -3.405 kg (-7 lb 8.1 oz) Filed Weights   03/06/15 1449 03/07/15 0434 03/08/15 0529  Weight: 63.005 kg (138 lb 14.4 oz) 62.4 kg (137 lb 9.1 oz) 59.6 kg (131 lb 6.3 oz)   Body mass index is 26.52 kg/(m^2).   Gen Exam: Not in any distress-appears somewhat tachypneic still Neck: Supple Chest: Good air entry bilaterally-no wheezing this morning but does have a few rales at bilateral bases. CVS: S1 S2 Regular Abdomen: soft, BS +, non tender, non distended. Extremities: no edema, lower extremities warm to touch. Neurologic:  Non Focal.  Skin: No Rash.   Wounds: N/A.   Intake/Output from previous day:  Intake/Output Summary (Last 24 hours) at 03/08/15 1311 Last data filed at 03/08/15 0534  Gross per 24 hour  Intake      0 ml  Output   1650 ml  Net  -1650 ml     LAB RESULTS: CBC  Recent Labs Lab  03/06/15 1038 03/06/15 2237  WBC 9.8 5.9  HGB 9.4* 8.5*  HCT 26.7* 24.6*  PLT 298 274  MCV 91.1 91.8  MCH 32.1 31.7  MCHC 35.2 34.6  RDW 12.8 12.9    Chemistries   Recent Labs Lab 03/06/15 1038 03/07/15 0557 03/08/15 0526  NA 121* 132* 135  K 4.5 3.5 3.4*  CL 90* 97* 97*  CO2 20* 24 25  GLUCOSE 124* 90 169*  BUN 23* 25* 34*  CREATININE 1.35* 1.37* 1.48*  CALCIUM 9.0 8.9 9.1    CBG: No results for input(s): GLUCAP in the last 168 hours.  GFR Estimated Creatinine Clearance: 18.3 mL/min (by C-G formula based on Cr of 1.48).  Coagulation profile No results for input(s): INR, PROTIME in the last 168 hours.  Cardiac Enzymes  Recent Labs Lab 03/06/15 1752 03/06/15 2237 03/07/15 0601  TROPONINI 0.08* 0.06* 0.04*    Invalid input(s): POCBNP No results for input(s): DDIMER in the last 72 hours. No results for input(s): HGBA1C in the last 72 hours. No results for input(s): CHOL, HDL, LDLCALC, TRIG, CHOLHDL, LDLDIRECT in the last 72 hours. No results for input(s): TSH, T4TOTAL, T3FREE, THYROIDAB in the last 72 hours.  Invalid input(s): FREET3 No results for input(s): VITAMINB12, FOLATE, FERRITIN, TIBC, IRON, RETICCTPCT in the last 72 hours. No results for input(s): LIPASE, AMYLASE in the last 72 hours.  Urine Studies No results for input(s): UHGB, CRYS in the last 72 hours.  Invalid input(s): UACOL, UAPR, USPG, UPH, UTP, UGL, UKET, UBIL, UNIT, UROB, ULEU, UEPI, UWBC, URBC, UBAC, CAST, UCOM, BILUA  MICROBIOLOGY: No results found for this or any previous visit (from the past 240 hour(s)).  RADIOLOGY STUDIES/RESULTS: Dg Chest 2 View  03/06/2015   CLINICAL DATA:  Shortness of breath, CHF, hypertension  EXAM: CHEST  2 VIEW  COMPARISON:  02/27/2015  FINDINGS: Bilateral diffuse mild interstitial thickening. Small left pleural effusion. Trace right pleural effusion. No pneumothorax. No focal consolidation. Stable cardiomegaly. Enlargement of the central pulmonary  vasculature. No acute osseous abnormality.  IMPRESSION: Findings most concerning for mild CHF.   Electronically Signed   By: Kathreen Devoid   On: 03/06/2015 11:41   Dg Chest Port 1 View  03/08/2015   CLINICAL DATA:  Shortness of breath.  EXAM: PORTABLE CHEST - 1 VIEW  COMPARISON:  03/07/2015, 03/06/2015 and 02/27/2015  FINDINGS: Chronic cardiomegaly. Tortuosity of the thoracic aorta. Pulmonary vascularity is normal. No infiltrates. Small left effusion persists. No acute osseous abnormality.  IMPRESSION: No significant change.  Persistent small left effusion.   Electronically Signed   By: Lorriane Shire M.D.   On: 03/08/2015 08:09   Dg Chest Port 1 View  03/07/2015   CLINICAL DATA:  Shortness of breath and pulmonary edema.  EXAM: PORTABLE CHEST - 1 VIEW  COMPARISON:  03/06/2015  FINDINGS: Heart size is upper limits of normal but unchanged. Stable blunting at the left costophrenic angle suggests a small pleural effusion with atelectasis. There is mild central peribronchial thickening which could represent mild interstitial edema.  IMPRESSION: Slightly improved lung aeration compared to the prior examination. Small  left pleural effusion and question mild interstitial edema.   Electronically Signed   By: Markus Daft M.D.   On: 03/07/2015 07:37    Oren Binet, MD  [facility] Pager:[phone]  If 7PM-7AM, please contact night-coverage [email or web address] Password [email or web address] 03/08/2015, 1:11 PM   LOS: 2 days

## 2015-03-08 NOTE — Progress Notes (Signed)
  Echocardiogram 2D Echocardiogram has been performed.  Tina Weaver 03/08/2015, 1:30 PM

## 2015-03-08 NOTE — Evaluation (Signed)
Clinical/Bedside Swallow Evaluation Patient Details  Name: SHAILYN WEYANDT MRN: [id number] Date of Birth: [birth year]  Today's Date: 03/08/2015 Time: SLP Start Time (ACUTE ONLY): 1000 SLP Stop Time (ACUTE ONLY): 1015 SLP Time Calculation (min) (ACUTE ONLY): 15 min  Past Medical History:  Past Medical History  Diagnosis Date  . CHF (congestive heart failure)   . Hypertension    Past Surgical History:  Past Surgical History  Procedure Laterality Date  . Abdominal hysterectomy     HPI:  Pt is a 81 year old female admitted with SOB, wheezing. Pt with CHF. CXR shows no infiltrates.    Assessment / Plan / Recommendation Clinical Impression  Pt demonstrates normal swallow function. After 8 oz of liquids with continous straw sips, swallow was consistently strong and timely with no signs of aspiration; breathing/swallowing pattern was WNL with no new shortness of breath. Recommend pt continue current diet. Aspiration appears unlikely though objective testing needed to rule out completely. Given no finding of infiltrate on CXR, would not proceed with MBS. Sign off.     Aspiration Risk  Mild    Diet Recommendation Age appropriate regular solids;Thin   Medication Administration: Whole meds with liquid    Other  Recommendations Oral Care Recommendations: Oral care BID   Follow Up Recommendations       Frequency and Duration        Pertinent Vitals/Pain NA    SLP Swallow Goals     Swallow Study Prior Functional Status       General Other Pertinent Information: Pt is a 81 year old female admitted with SOB, wheezing. Pt with CHF. CXR shows no infiltrates.  Type of Study: Bedside swallow evaluation Diet Prior to this Study: Regular;Thin liquids Temperature Spikes Noted: No Respiratory Status: Room air History of Recent Intubation: No Behavior/Cognition: Alert;Cooperative;Pleasant mood Oral Cavity - Dentition: Other (Comment) (partial) Self-Feeding Abilities: Able to feed  self Patient Positioning: Upright in bed Baseline Vocal Quality: Normal;Low vocal intensity Volitional Cough: Strong Volitional Swallow: Able to elicit    Oral/Motor/Sensory Function Overall Oral Motor/Sensory Function: Appears within functional limits for tasks assessed   Ice Chips     Thin Liquid Thin Liquid: Within functional limits Presentation: Cup;Straw;Self Fed    Nectar Thick Nectar Thick Liquid: Not tested   Honey Thick Honey Thick Liquid: Not tested   Puree Puree: Within functional limits   Solid   GO    Solid: Within functional limits      [facility], [place] CCC-SLP [phone]  Lynann Beaver 03/08/2015,11:08 AM

## 2015-03-09 ENCOUNTER — Other Ambulatory Visit: Payer: Self-pay | Admitting: Internal Medicine

## 2015-03-09 DIAGNOSIS — Z515 Encounter for palliative care: Secondary | ICD-10-CM | POA: Insufficient documentation

## 2015-03-09 DIAGNOSIS — I5031 Acute diastolic (congestive) heart failure: Secondary | ICD-10-CM

## 2015-03-09 DIAGNOSIS — J9601 Acute respiratory failure with hypoxia: Secondary | ICD-10-CM

## 2015-03-09 DIAGNOSIS — I421 Obstructive hypertrophic cardiomyopathy: Secondary | ICD-10-CM

## 2015-03-09 DIAGNOSIS — R0600 Dyspnea, unspecified: Secondary | ICD-10-CM | POA: Insufficient documentation

## 2015-03-09 LAB — CBC
HCT: 27.3 % — ABNORMAL LOW (ref 36.0–46.0)
HEMOGLOBIN: 9.2 g/dL — AB (ref 12.0–15.0)
MCH: 31.5 pg (ref 26.0–34.0)
MCHC: 33.7 g/dL (ref 30.0–36.0)
MCV: 93.5 fL (ref 78.0–100.0)
Platelets: 426 10*3/uL — ABNORMAL HIGH (ref 150–400)
RBC: 2.92 MIL/uL — ABNORMAL LOW (ref 3.87–5.11)
RDW: 13.2 % (ref 11.5–15.5)
WBC: 11.5 10*3/uL — AB (ref 4.0–10.5)

## 2015-03-09 LAB — BASIC METABOLIC PANEL
Anion gap: 12 (ref 5–15)
BUN: 39 mg/dL — AB (ref 6–20)
CHLORIDE: 95 mmol/L — AB (ref 101–111)
CO2: 27 mmol/L (ref 22–32)
CREATININE: 1.54 mg/dL — AB (ref 0.44–1.00)
Calcium: 9 mg/dL (ref 8.9–10.3)
GFR calc Af Amer: 32 mL/min — ABNORMAL LOW (ref >60)
GFR calc non Af Amer: 28 mL/min — ABNORMAL LOW (ref >60)
GLUCOSE: 169 mg/dL — AB (ref 65–99)
POTASSIUM: 3.2 mmol/L — AB (ref 3.5–5.1)
Sodium: 134 mmol/L — ABNORMAL LOW (ref 135–145)

## 2015-03-09 MED ORDER — POTASSIUM CHLORIDE CRYS ER 20 MEQ PO TBCR
20.0000 meq | EXTENDED_RELEASE_TABLET | Freq: Two times a day (BID) | ORAL | Status: DC
  Administered 2015-03-09: 20 meq via ORAL
  Filled 2015-03-09 (×2): qty 1

## 2015-03-09 MED ORDER — POTASSIUM CHLORIDE CRYS ER 20 MEQ PO TBCR
30.0000 meq | EXTENDED_RELEASE_TABLET | Freq: Every day | ORAL | Status: DC
  Administered 2015-03-09: 30 meq via ORAL
  Filled 2015-03-09: qty 1

## 2015-03-09 MED ORDER — FUROSEMIDE 40 MG PO TABS
40.0000 mg | ORAL_TABLET | Freq: Two times a day (BID) | ORAL | Status: DC
  Administered 2015-03-09 – 2015-03-10 (×2): 40 mg via ORAL
  Filled 2015-03-09 (×4): qty 1

## 2015-03-09 MED ORDER — POTASSIUM CHLORIDE CRYS ER 20 MEQ PO TBCR
20.0000 meq | EXTENDED_RELEASE_TABLET | Freq: Once | ORAL | Status: AC
  Administered 2015-03-09: 20 meq via ORAL
  Filled 2015-03-09: qty 1

## 2015-03-09 NOTE — Clinical Social Work Note (Signed)
Clinical Social Work Assessment  Patient Details  Name: Tina [facility] MRN: [id number] Date of Birth: Oct 16, 1918  Date of referral:  03/09/15               Reason for consult:  Facility Placement                Permission sought to share information with:  Family Supports, Chartered certified accountant granted to share information::  Yes, Verbal Permission Granted  Name::     Tina [facility], 2 sons- Tina Weaver and Tina [facility], Daughter-in-law Tina Weaver  Agency::     Relationship::     Contact Information:     Housing/Transportation Living arrangements for Tina past 2 months:  [place] of Information:  Adult Children, Patient Patient Interpreter Needed:  None Criminal Activity/Legal Involvement Pertinent to Current Situation/Hospitalization:  No - Comment as needed Significant Relationships:  Adult Children Lives with:  Adult Children Do you feel safe going back to Tina place where you live?  Yes Need for family participation in patient care:  Yes (Comment)  Care giving concerns:  Lives at home with son and daugther-in-law. Noted weakness and condition deteriorated to point where family was consider hospice services. She is more improved and now seeking SNF.  Social Worker assessment / plan:  CSW met with patient's 2 sons Tina Weaver and Tina Weaver and daugther-in-law Tina Weaver after they had discussed SNF with patient and determined that SNF was best option at this time.  Family requested [name]- however- once CSW discussed Tina actual bed search process, insurance approval process etc- family agreed to consider SNF list. Patient has [facility] which requires pre- authorization. This process was discussed in detail with sons and notified that facility would week this authorization on Monday as they are closed over there weekend.   CSW attempted to reach admissions at [facility] to discuss possible private pay admit until [facility] could be obtained but unable to reach admissions.   Per family's request- spoke to admissions at [facility]. They will accept patient with private pay funds X2 weeks and initiate Tina insurance authorization. Family has determined that [facility] is actually as close or perhaps closer than [facility].  Per MD- anticipate stability for d/c tomorrow. Ok per [facility] for weekend admit. Fl2 completed and placed on chart for MD's signature.   Employment status:  Retired Nurse, adult PT Recommendations:  [place] / Referral to community resources:  [place]  Patient/Family's Response to care:  Family does not feel they can adequately manage patient's care at home at this time.   Patient/Family's Understanding of and Emotional Response to Diagnosis, Current Treatment, and Prognosis:  Patient and family appear to have a good understanding of reason for hospitalization and for SNF placement. Family noted to be very supportive, involved and all denied any questions or concerns at this time.   Emotional Assessment Appearance:  Appears stated age Attitude/Demeanor/Rapport:   (Calm, Relaxed, approriate for age and situation) Affect (typically observed):  Quiet, Calm, Pleasant Orientation:  Oriented to Self, Oriented to Place, Oriented to  Time, Oriented to Situation Alcohol / Substance use:  Never Used Psych involvement (Current and /or in Tina community):  No (Comment)  Discharge Needs  Concerns to be addressed:  Care Coordination Readmission within Tina last 30 days:  No Current discharge risk:  None Barriers to Discharge:  Ship broker, Continued Medical Work up   Tina Bane T, LCSW 03/09/2015, 2:00  PM

## 2015-03-09 NOTE — Progress Notes (Signed)
Physical Therapy Treatment Patient Details Name: Tina Weaver MRN: [id number] DOB: [birth year] Today's Date: 2015-03-11    History of Present Illness 81 yo female with onset of acute respiratory failure and pulm edema was admitted, noted also elevated troponin, hypoxia, valve regurg, aortic stenosis    PT Comments    Patient for second walk today with improving distance, tolerance to activity without oxygen and stability with and without walker.  Feel short term rehab to return to independent still indicated.  Will continue to follow acutely and encouraged pt to walk with nursing assist over the weekend.  Follow Up Recommendations  SNF     Equipment Recommendations  Rolling walker with 5" wheels    Recommendations for Other Services       Precautions / Restrictions Precautions Precautions: Fall    Mobility  Bed Mobility   Bed Mobility: Sit to Supine       Sit to supine: HOB elevated;Supervision   General bed mobility comments: increased effort and time to get adjusted in bed with elevated HOB  Transfers Overall transfer level: Needs assistance Equipment used: Rolling walker (2 wheeled);None Transfers: Sit to/from Stand Sit to Stand: Min guard         General transfer comment: stood from toilet in bathroom with nursing assist without device; sat to bed after ambulation using walker  Ambulation/Gait Ambulation/Gait assistance: Supervision Ambulation Distance (Feet): 200 Feet Assistive device: Rolling walker (2 wheeled) Gait Pattern/deviations: Step-through pattern;Decreased stride length     General Gait Details: increased time on turns, but stays in walker appropriately   Stairs            Wheelchair Mobility    Modified Rankin (Stroke Patients Only)       Balance Overall balance assessment: Needs assistance           Standing balance-Leahy Scale: Fair Standing balance comment: washing hands at sink and mobilizing in room with nursing  assist without walker                    Cognition Arousal/Alertness: Awake/alert Behavior During Therapy: WFL for tasks assessed/performed Overall Cognitive Status: Within Functional Limits for tasks assessed                      Exercises      General Comments        Pertinent Vitals/Pain Pain Assessment: No/denies pain    Home Living                      Prior Function            PT Goals (current goals can now be found in the care plan section) Progress towards PT goals: Progressing toward goals    Frequency  Min 3X/week    PT Plan Current plan remains appropriate;Frequency needs to be updated    Co-evaluation             End of Session Equipment Utilized During Treatment: Gait belt Activity Tolerance: Patient tolerated treatment well Patient left: in bed;with call bell/phone within reach;with bed alarm set     Time: 8676-1950 PT Time Calculation (min) (ACUTE ONLY): 16 min  Charges:  $Gait Training: 8-22 mins                    G Codes:      Tina Weaver,Tina Weaver 03-11-2015, 2:18 PM  Magda Kiel, [place] 2015/03/11

## 2015-03-09 NOTE — Care Management Important Message (Signed)
Important Message  Patient Details  Name: ADDA STOKES MRN: [id number] Date of Birth: [birth year]   Medicare Important Message Given:  Yes-second notification given    Pricilla Handler 03/09/2015, 12:08 PM

## 2015-03-09 NOTE — Progress Notes (Signed)
PATIENT DETAILS Name: Tina [facility] Age: 81 y.o. Sex: female Date of Birth: 09-Apr-1919 Admit Date: 03/06/2015 Admitting Physician Cherene Altes, MD PCP:No primary care provider on file.  Subjective: Significantly more improved today-not SOB at rest.  Assessment/Plan: Principal Problem: Acute diastolic heart failure: Much improved with intravenous Lasix-now compensated-change to Lasix 40 mg BID. Repeat Echo confirms diastolic CHF, VQ scan neg for PE-if does well, suspect d/c in am.Weight down to 131 lbs (138 lbs on admission), neg balance of 5.3 L  Active Problems: Acute hypoxic respiratory failure: Secondary to above, reviewed notes in care everywhere-lower extremity Doppler on 02/28/15 negative for DVT. VQ scan neg for PE. Much improved with diuresis. .  Acute chronic kidney disease stage III: creatinine slightly elevated than her usual baseline. Change to oral lasix, follow lytes, may need to tolerate mild increase in kidney function in order to keep her euvolemic.   Hyponatremia: Secondary to hypervolemia. Resolved with IV Lasix. Follow  Anemia: Likely secondary to chronic disease. No indication for transfusion-follow  Hypothyroidism: Continue levothyroxine.  Mild to moderate aortic stenosis:  Doubt candidate for any further workup/intervention  Hypokalemia:secodnary to diuretics. Replete and recheck  Palliative care: DNR in place-reconfirmed with family. Family aware that patient is not a candidate for aggressive care. Currently goals are to diurese and see if any improvement, if any further deterioration, may need palliative care and transitioning to hospice/comfort care. Have consulted palliative care  Disposition: Remain inpatient-SNF vs Home with hospice tomorrow if clinical improvement continues  Antimicrobial agents  See below  Anti-infectives    None      DVT Prophylaxis: Prophylactic Lovenox   Code Status: DNR  Family Communication 2  sons-daughter-in-law at bedside  Procedures: None  CONSULTS:  None  Time spent 30 minutes-Greater than 50% of this time was spent in counseling, explanation of diagnosis, planning of further management, and coordination of care.  MEDICATIONS: Scheduled Meds: . aspirin EC  81 mg Oral Daily  . atorvastatin  20 mg Oral Daily  . enoxaparin (LOVENOX) injection  30 mg Subcutaneous Q24H  . furosemide  40 mg Oral BID  . ipratropium-albuterol  3 mL Nebulization TID  . levothyroxine  100 mcg Oral QAC breakfast  . potassium chloride  20 mEq Oral BID  . potassium chloride  20 mEq Oral Once   Continuous Infusions:  PRN Meds:.acetaminophen, albuterol, ALPRAZolam, ondansetron (ZOFRAN) IV, technetium TC 21M diethylenetriame-pentaacetic acid    PHYSICAL EXAM: Vital signs in last 24 hours: Filed Vitals:   03/09/15 0430 03/09/15 0845 03/09/15 0956 03/09/15 1032  BP: 156/57  167/48   Pulse: 76  89   Temp: 97.7 F (36.5 C)     TempSrc: Oral     Resp: 18  18   Height:      Weight: 59.8 kg (131 lb 13.4 oz)     SpO2: 98% 99% 95% 93%    Weight change: 0.2 kg (7.1 oz) Filed Weights   03/07/15 0434 03/08/15 0529 03/09/15 0430  Weight: 62.4 kg (137 lb 9.1 oz) 59.6 kg (131 lb 6.3 oz) 59.8 kg (131 lb 13.4 oz)   Body mass index is 26.61 kg/(m^2).   Gen Exam: Not in any distress-not tachypneic Neck: Supple Chest: Lungs clear this am! CVS: S1 S2 Regular Abdomen: soft, BS +, non tender, non distended. Extremities: no edema, lower extremities warm to touch. Neurologic: Non Focal.  Skin: No Rash.  Wounds: N/A.   Intake/Output from previous day:  Intake/Output Summary (Last 24 hours) at 03/09/15 1319 Last data filed at 03/09/15 1200  Gross per 24 hour  Intake    700 ml  Output   3300 ml  Net  -2600 ml     LAB RESULTS: CBC  Recent Labs Lab 03/06/15 1038 03/06/15 2237 03/09/15 0250  WBC 9.8 5.9 11.5*  HGB 9.4* 8.5* 9.2*  HCT 26.7* 24.6* 27.3*  PLT 298 274 426*  MCV 91.1  91.8 93.5  MCH 32.1 31.7 31.5  MCHC 35.2 34.6 33.7  RDW 12.8 12.9 13.2    Chemistries   Recent Labs Lab 03/06/15 1038 03/07/15 0557 03/08/15 0526 03/09/15 0250  NA 121* 132* 135 134*  K 4.5 3.5 3.4* 3.2*  CL 90* 97* 97* 95*  CO2 20* 24 25 27   GLUCOSE 124* 90 169* 169*  BUN 23* 25* 34* 39*  CREATININE 1.35* 1.37* 1.48* 1.54*  CALCIUM 9.0 8.9 9.1 9.0    CBG: No results for input(s): GLUCAP in the last 168 hours.  GFR Estimated Creatinine Clearance: 17.6 mL/min (by C-G formula based on Cr of 1.54).  Coagulation profile No results for input(s): INR, PROTIME in the last 168 hours.  Cardiac Enzymes  Recent Labs Lab 03/06/15 1752 03/06/15 2237 03/07/15 0601  TROPONINI 0.08* 0.06* 0.04*    Invalid input(s): POCBNP No results for input(s): DDIMER in the last 72 hours. No results for input(s): HGBA1C in the last 72 hours. No results for input(s): CHOL, HDL, LDLCALC, TRIG, CHOLHDL, LDLDIRECT in the last 72 hours. No results for input(s): TSH, T4TOTAL, T3FREE, THYROIDAB in the last 72 hours.  Invalid input(s): FREET3 No results for input(s): VITAMINB12, FOLATE, FERRITIN, TIBC, IRON, RETICCTPCT in the last 72 hours. No results for input(s): LIPASE, AMYLASE in the last 72 hours.  Urine Studies No results for input(s): UHGB, CRYS in the last 72 hours.  Invalid input(s): UACOL, UAPR, USPG, UPH, UTP, UGL, UKET, UBIL, UNIT, UROB, ULEU, UEPI, UWBC, URBC, UBAC, CAST, UCOM, BILUA  MICROBIOLOGY: No results found for this or any previous visit (from the past 240 hour(s)).  RADIOLOGY STUDIES/RESULTS: Dg Chest 2 View  03/06/2015   CLINICAL DATA:  Shortness of breath, CHF, hypertension  EXAM: CHEST  2 VIEW  COMPARISON:  02/27/2015  FINDINGS: Bilateral diffuse mild interstitial thickening. Small left pleural effusion. Trace right pleural effusion. No pneumothorax. No focal consolidation. Stable cardiomegaly. Enlargement of the central pulmonary vasculature. No acute osseous  abnormality.  IMPRESSION: Findings most concerning for mild CHF.   Electronically Signed   By: Kathreen Devoid   On: 03/06/2015 11:41   Nm Pulmonary Perf And Vent  03/08/2015   CLINICAL DATA:  Short of breath with wheezing for the past 2 days. History of CHF.  EXAM: NUCLEAR MEDICINE VENTILATION - PERFUSION LUNG SCAN  TECHNIQUE: Ventilation images were obtained in multiple projections using inhaled aerosol Tc-58m DTPA. Perfusion images were obtained in multiple projections after intravenous injection of Tc-76m MAA.  RADIOPHARMACEUTICALS:  37.7 mCi of Technetium-69m DTPA aerosol inhalation and 5.8 mCi of Technetium-35m MAA IV  COMPARISON:  Chest radiograph, 03/08/2015  FINDINGS: Ventilation: No focal ventilation defect.  Perfusion: No wedge shaped peripheral perfusion defects to suggest acute pulmonary embolism.  IMPRESSION: Normal ventilation-perfusion study. No evidence of a pulmonary thromboembolism.   Electronically Signed   By: Lajean Manes M.D.   On: 03/08/2015 14:20   Dg Chest Port 1 View  03/08/2015   CLINICAL DATA:  Shortness of breath.  EXAM: PORTABLE CHEST - 1 VIEW  COMPARISON:  03/07/2015, 03/06/2015 and 02/27/2015  FINDINGS: Chronic cardiomegaly. Tortuosity of the thoracic aorta. Pulmonary vascularity is normal. No infiltrates. Small left effusion persists. No acute osseous abnormality.  IMPRESSION: No significant change.  Persistent small left effusion.   Electronically Signed   By: Lorriane Shire M.D.   On: 03/08/2015 08:09   Dg Chest Port 1 View  03/07/2015   CLINICAL DATA:  Shortness of breath and pulmonary edema.  EXAM: PORTABLE CHEST - 1 VIEW  COMPARISON:  03/06/2015  FINDINGS: Heart size is upper limits of normal but unchanged. Stable blunting at the left costophrenic angle suggests a small pleural effusion with atelectasis. There is mild central peribronchial thickening which could represent mild interstitial edema.  IMPRESSION: Slightly improved lung aeration compared to the prior  examination. Small left pleural effusion and question mild interstitial edema.   Electronically Signed   By: Markus Daft M.D.   On: 03/07/2015 07:37    Oren Binet, MD  [facility] Pager:[phone]  If 7PM-7AM, please contact night-coverage [email or web address] Password [email or web address] 03/09/2015, 1:19 PM   LOS: 3 days

## 2015-03-09 NOTE — Clinical Social Work Placement (Signed)
   CLINICAL SOCIAL WORK PLACEMENT  NOTE  Date:  03/09/2015  Patient Details  Name: Tina [facility] MRN: [id number] Date of Birth: May 13, 1919  Clinical Social Work is seeking post-discharge placement for this patient at the [place] level of care (*CSW will initial, date and re-position this form in  chart as items are completed):  Yes   Patient/family provided with [facility] Work Department's list of facilities offering this level of care within the geographic area requested by the patient (or if unable, by the patient's family).  Yes   Patient/family informed of their freedom to choose among providers that offer the needed level of care, that participate in Medicare, Medicaid or managed care program needed by the patient, have an available bed and are willing to accept the patient.  Yes   Patient/family informed of [facility]'s ownership interest in [facility] and [facility], as well as of the fact that they are under no obligation to receive care at these facilities.  PASRR submitted to EDS on 03/09/15     PASRR number received on 03/09/15     Existing PASRR number confirmed on       FL2 transmitted to all facilities in geographic area requested by pt/family on 03/09/15     FL2 transmitted to all facilities within larger geographic area on       Patient informed that his/her managed care company has contracts with or will negotiate with certain facilities, including the following:   [employer]- requires pre-authorization)     Yes   Patient/family informed of bed offers received.  Patient chooses bed at [facility]     Physician recommends and patient chooses bed at      Patient to be transferred to [facility] on 03/09/15.  Patient to be transferred to facility by Car with son      Patient family notified on   of transfer.  Name of family member notified:        PHYSICIAN Please prepare priority discharge summary,  including medications, Please sign FL2, Please prepare prescriptions, Please sign DNR     Additional Comment:    _______________________________________________ Williemae Area, LCSW 03/09/2015, 2:50 PM

## 2015-03-09 NOTE — Consult Note (Signed)
Consultation Note Date: 03/09/2015   Patient Name: Tina Weaver  DOB: Oct 07, 1918  MRN: [id number]  Age / Sex: 81 y.o., female   PCP: No primary care provider on file. Referring Physician: Jonetta Osgood, MD  Reason for Consultation: Establishing goals of care  Palliative Care Assessment and Plan Summary of Established Goals of Care and Medical Treatment Preferences   This is a 81 year old female patient who was discharged from [facility] on 8/6 after being treated for acute diastolic heart failure exacerbation. Patient has underlying chronic kidney disease stage III, grade 1 diastolic dysfunction with moderate concentric hypertrophy and associated mild tricuspid and mitral regurgitation as well as some moderate aortic stenosis. She also has hypothyroidism as well as chronic hyponatremia with baseline sodium reported to be between 126 and 132.   The patient has been admitted to the hospitalist service since 03-06-15 for acute on chronic hypoxic respiratory failure deemed to secondary to acute diastolic heart failure exacerbation deemed secondary to her hypertrophic cardiomyopathy. Patient also has a history of aortic stenosis. During this hospitalization, patient has been given IV Lasix, with her underlying stage III chronic kidney disease history, serial serum creatinine numbers have also been followed. Patient underwent a repeat echocardiogram on 03-08-15 showing moderate degree of aortic stenosis, grade 1 diastolic dysfunction, vigorous systolic function estimated ejection fraction 65-70 percent. Patient continues to remain dyspneic, also orthopneic. In light of the patient's advanced age, recent recurrent CHF exacerbations, underlying chronic kidney disease, history of at least moderate degree of aortic stenosis, a palliative consultation was deemed appropriate for further goals of care discussions  Patient is a pale weak appearing elderly lady resting in bed. She is awake and alert.  She is oriented. She answers appropriately and participates in all aspects of her history taking. 2 sons are present at the bedside. 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 The patient wishes for follow-up from physical therapy today. She states that her dyspnea, orthopnea is better this a.m. Appetite is moderately diminished. Patient denies any chest discomfort.  Introduced scope of palliative medicine. Described palliative care as more of care focusing on symptom management and quality of life in the face of chronic, irreversible, progressive illness which in her condition is her heart failure. Discussed symptom management with opioids. Discussed prognosis and trajectory pertaining to heart failure. All questions answered to the best of my ability. Patient wishes for titration of medications, transfer to skilled nursing facility for rehabilitation is the end of this hospitalization. Palliative service will remain aware of the patient and will follow along for determining her hospital trajectory. Briefly introduced that addition of hospice services may become appropriate in the very near future.   Contacts/Participants in Discussion: Primary Decision Maker:  Patient, then her 2 sons.    HCPOA: yes   2 sons   Code Status/Advance Care Planning:  DNR   Symptom Management:   No acute symptoms currently this am. Consider low dose PO opioids for management of dyspnea. Will follow.   Palliative Prophylaxis: yes, patient on anti emetic regimen.   Additional Recommendations (Limitations, Scope, Preferences):  Continue to follow hospital disease trajectory  Patient desiring of skilled nursing facility rehabilitation towards the end of this hospitalization Psycho-social/Spiritual:   Support System: strong, 2 sons  Desire for further Chaplaincy support:no  Prognosis: Likely weeks to months  Discharge  Planning:  [place] for rehab with Palliative  care service follow-up   Domains of Care: - Physical: Episodic chest discomfort, dyspnea, orthopnea none currently this a.m. - Psychological: Mood congruent. - Social: Is living independently. Has 2 sons. Is described as a very independent active woman. - Spiritual: No acute spiritual issues noted in initial encounter. Continue to follow. - Cultural: As above. 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 - Imminently dying: No but prognosis appears guarded given advanced age, CHF exacerbations, underlying renal failure and aortic stenosis. - Ethical/Legal: CODE STATUS is established as DO NOT RESUSCITATE. Patient's 2 sons are her healthcare decision makers.  Values: To continue in current life maintaining sustaining mode of care with gentle interventions. To focus on physical therapy for rehabilitation. Life limiting illness: Diastolic dysfunction, aortic stenosis, renal failure, advanced age      Chief Complaint/History of Present Illness: dyspnea   Primary Diagnoses  Present on Admission:  . Acute diastolic heart failure secondary to hypertrophic cardiomyopathy . Mild tricuspid regurgitation . Moderate mitral regurgitation . Acute on chronic hyponatremia . Hypothyroidism . HTN (hypertension) . Anemia of chronic disease . CKD (chronic kidney disease), stage III . Acute respiratory failure with hypoxia  Palliative Review of Systems:  completed.  I have reviewed the medical record, interviewed the patient and family, and examined the patient. The following aspects are pertinent.  Past Medical History  Diagnosis Date  . CHF (congestive heart failure)   . Hypertension    Social History   Social History  . Marital Status: Widowed    Spouse Name: N/A  . Number of Children: N/A  . Years of Education: N/A   Social History Main Topics  . Smoking status: Never Smoker   . Smokeless tobacco: None  . Alcohol  Use: No  . Drug Use: None  . Sexual Activity: Not Asked   Other Topics Concern  . None   Social History Narrative   History reviewed. No pertinent family history. Scheduled Meds: . aspirin EC  81 mg Oral Daily  . atorvastatin  20 mg Oral Daily  . enoxaparin (LOVENOX) injection  30 mg Subcutaneous Q24H  . furosemide  40 mg Intravenous 3 times per day  . ipratropium-albuterol  3 mL Nebulization TID  . levothyroxine  100 mcg Oral QAC breakfast  . potassium chloride  30 mEq Oral Daily   Continuous Infusions:  PRN Meds:.acetaminophen, albuterol, ALPRAZolam, ondansetron (ZOFRAN) IV, technetium TC 45M diethylenetriame-pentaacetic acid Medications Prior to Admission:  Prior to Admission medications   Medication Sig Start Date End Date Taking? Authorizing Provider  amLODipine (NORVASC) 10 MG tablet Take 10 mg by mouth 2 (two) times daily.   Yes Historical Provider, MD  aspirin EC 81 MG tablet Take 81 mg by mouth daily.   Yes Historical Provider, MD  atorvastatin (LIPITOR) 20 MG tablet Take 20 mg by mouth daily.   Yes Historical Provider, MD  furosemide (LASIX) 20 MG tablet Take 20 mg by mouth daily.   Yes Historical Provider, MD  levothyroxine (SYNTHROID, LEVOTHROID) 100 MCG tablet Take 100 mcg by mouth daily before breakfast.   Yes Historical Provider, MD  metoprolol tartrate (LOPRESSOR) 25 MG tablet Take 25 mg by mouth 2 (two) times daily.   Yes Historical Provider, MD  nitrofurantoin, macrocrystal-monohydrate, (MACROBID) 100 MG capsule Take 100 mg by mouth 2 (two) times daily.    Historical Provider, MD   Allergies  Allergen Reactions  . Fosamax [Alendronate Sodium] Other (See Comments)    "Pain to legs" per pt's family  member   CBC:    Component Value Date/Time   WBC 11.5* 03/09/2015 0250   HGB 9.2* 03/09/2015 0250   HCT 27.3* 03/09/2015 0250   PLT 426* 03/09/2015 0250   MCV 93.5 03/09/2015 0250   Comprehensive Metabolic Panel:    Component Value Date/Time   NA 134*  03/09/2015 0250   K 3.2* 03/09/2015 0250   CL 95* 03/09/2015 0250   CO2 27 03/09/2015 0250   BUN 39* 03/09/2015 0250   CREATININE 1.54* 03/09/2015 0250   GLUCOSE 169* 03/09/2015 0250   CALCIUM 9.0 03/09/2015 0250   AST 22 03/07/2015 0557   ALT 21 03/07/2015 0557   ALKPHOS 48 03/07/2015 0557   BILITOT 0.5 03/07/2015 0557   PROT 6.5 03/07/2015 0557   ALBUMIN 3.2* 03/07/2015 0557    Physical Exam: Vital Signs: BP 156/57 mmHg  Pulse 76  Temp(Src) 97.7 F (36.5 C) (Oral)  Resp 18  Ht 4\' 11"  (1.499 m)  Wt 59.8 kg (131 lb 13.4 oz)  BMI 26.61 kg/m2  SpO2 99% SpO2: SpO2: 99 % O2 Device: O2 Device: Nasal Cannula O2 Flow Rate: O2 Flow Rate (L/min): 2.5 L/min Intake/output summary:  Intake/Output Summary (Last 24 hours) at 03/09/15 0957 Last data filed at 03/09/15 0956  Gross per 24 hour  Intake    700 ml  Output   1900 ml  Net  -1200 ml   LBM: Last BM Date: 03/08/15 Baseline Weight: Weight: 63.005 kg (138 lb 14.4 oz) Most recent weight: Weight: 59.8 kg (131 lb 13.4 oz)  Exam Findings:   weak elderly lady pale NAD Clear anteriorly, diminished bases X3A3 murmur systolic Abdomen soft Trace edema Non focal                        Palliative Performance Scale: 40% Additional Data Reviewed: Recent Labs     03/06/15  2237   03/08/15  0526  03/09/15  0250  WBC  5.9   --    --   11.5*  HGB  8.5*   --    --   9.2*  PLT  274   --    --   426*  NA   --    < >  135  134*  BUN   --    < >  34*  39*  CREATININE   --    < >  1.48*  1.54*   < > = values in this interval not displayed.     Time In: 0900 Time Out: 0955 Time Total: 55 min  Greater than 50%  of this time was spent counseling and coordinating care related to the above assessment and plan.  Signed by: Loistine Chance, MD [place], MD  03/09/2015, 9:57 AM  Please contact Palliative Medicine Team phone at [phone] for questions and concerns.

## 2015-03-10 DIAGNOSIS — N179 Acute kidney failure, unspecified: Secondary | ICD-10-CM

## 2015-03-10 LAB — BASIC METABOLIC PANEL
ANION GAP: 10 (ref 5–15)
BUN: 37 mg/dL — ABNORMAL HIGH (ref 6–20)
CO2: 28 mmol/L (ref 22–32)
Calcium: 9.3 mg/dL (ref 8.9–10.3)
Chloride: 98 mmol/L — ABNORMAL LOW (ref 101–111)
Creatinine, Ser: 1.64 mg/dL — ABNORMAL HIGH (ref 0.44–1.00)
GFR calc Af Amer: 30 mL/min — ABNORMAL LOW (ref >60)
GFR, EST NON AFRICAN AMERICAN: 26 mL/min — AB (ref >60)
Glucose, Bld: 105 mg/dL — ABNORMAL HIGH (ref 65–99)
Potassium: 4 mmol/L (ref 3.5–5.1)
Sodium: 136 mmol/L (ref 135–145)

## 2015-03-10 MED ORDER — ALBUTEROL SULFATE (2.5 MG/3ML) 0.083% IN NEBU: 2.5000 mg | INHALATION_SOLUTION | RESPIRATORY_TRACT | Status: DC | PRN

## 2015-03-10 MED ORDER — BISOPROLOL FUMARATE 5 MG PO TABS
5.0000 mg | ORAL_TABLET | Freq: Every day | ORAL | Status: DC
  Administered 2015-03-10: 5 mg via ORAL
  Filled 2015-03-10: qty 1

## 2015-03-10 MED ORDER — FUROSEMIDE 20 MG PO TABS: 20.0000 mg | ORAL_TABLET | Freq: Every evening | ORAL | Status: DC

## 2015-03-10 MED ORDER — FUROSEMIDE 40 MG PO TABS: 40.0000 mg | ORAL_TABLET | Freq: Every morning | ORAL | Status: DC

## 2015-03-10 MED ORDER — AMLODIPINE BESYLATE 5 MG PO TABS: 5.0000 mg | ORAL_TABLET | Freq: Two times a day (BID) | ORAL | Status: DC

## 2015-03-10 MED ORDER — POTASSIUM CHLORIDE CRYS ER 20 MEQ PO TBCR: 20.0000 meq | EXTENDED_RELEASE_TABLET | Freq: Two times a day (BID) | ORAL | Status: DC

## 2015-03-10 NOTE — Clinical Social Work Placement (Signed)
   CLINICAL SOCIAL WORK PLACEMENT  NOTE  Date:  03/10/2015  Patient Details  Name: Tina Weaver MRN: [id number] Date of Birth: [birth year]  Clinical Social Work is seeking post-discharge placement for this patient at the [place] level of care (*CSW will initial, date and re-position this form in  chart as items are completed):  Yes   Patient/family provided with [place] Work Department's list of facilities offering this level of care within the geographic area requested by the patient (or if unable, by the patient's family).  Yes   Patient/family informed of their freedom to choose among providers that offer the needed level of care, that participate in Medicare, Medicaid or managed care program needed by the patient, have an available bed and are willing to accept the patient.  Yes   Patient/family informed of [facility]'s ownership interest in [facility], as well as of the fact that they are under no obligation to receive care at these facilities.  PASRR submitted to EDS on 03/09/15     PASRR number received on 03/09/15     Existing PASRR number confirmed on       FL2 transmitted to all facilities in geographic area requested by pt/family on 03/09/15     FL2 transmitted to all facilities within larger geographic area on       Patient informed that his/her managed care company has contracts with or will negotiate with certain facilities, including the following:   [employer]- requires pre-authorization)     Yes   Patient/family informed of bed offers received.  Patient chooses bed at [facility]     Physician recommends and patient chooses bed at      Patient to be transferred to [facility] on 03/10/15.  Patient to be transferred to facility by [facility] with son      Patient family notified on 03/10/15 of transfer.  Name of family member notified:   (son in room)     PHYSICIAN Please prepare priority  discharge summary, including medications, Please sign FL2, Please prepare prescriptions, Please sign DNR     Additional Comment:    _______________________________________________ Roanna Raider, LCSW 03/10/2015, 11:28 AM

## 2015-03-10 NOTE — Discharge Summary (Addendum)
PATIENT DETAILS Name: Tina Weaver Age: 81 y.o. Sex: female Date of Birth: 06/13/1919 MRN: [id number]. Admitting Physician: Cherene Altes, MD PCP:No primary care provider on file.  Admit Date: 03/06/2015 Discharge date: 03/10/2015  Recommendations for Outpatient Follow-up:  1. Please check BMET in 1 week-please adjust dosing of Lasix and potassium supplementation accordingly.   PRIMARY DISCHARGE DIAGNOSIS:  Principal Problem:   Acute diastolic heart failure secondary to hypertrophic cardiomyopathy Active Problems:   Mild tricuspid regurgitation   Moderate mitral regurgitation   Acute on chronic hyponatremia   Hypothyroidism   HTN (hypertension)   Anemia of chronic disease   CKD (chronic kidney disease), stage III   Moderate aortic stenosis   Acute respiratory failure with hypoxia   Dyspnea   Encounter for palliative care      PAST MEDICAL HISTORY: Past Medical History  Diagnosis Date  . CHF (congestive heart failure)   . Hypertension     DISCHARGE MEDICATIONS: Current Discharge Medication List    START taking these medications   Details  albuterol (PROVENTIL) (2.5 MG/3ML) 0.083% nebulizer solution Take 3 mLs (2.5 mg total) by nebulization every 2 (two) hours as needed for shortness of breath.    potassium chloride SA (K-DUR,KLOR-CON) 20 MEQ tablet Take 1 tablet (20 mEq total) by mouth 2 (two) times daily.      CONTINUE these medications which have CHANGED   Details  !! furosemide (LASIX) 20 MG tablet Take 1 tablet (20 mg total) by mouth every evening.    !! furosemide (LASIX) 40 MG tablet Take 1 tablet (40 mg total) by mouth every morning. Qty: 30 tablet     !! - Potential duplicate medications found. Please discuss with provider.    CONTINUE these medications which have NOT CHANGED   Details  aspirin EC 81 MG tablet Take 81 mg by mouth daily.    atorvastatin (LIPITOR) 20 MG tablet Take 20 mg by mouth daily.    levothyroxine (SYNTHROID, LEVOTHROID)  100 MCG tablet Take 100 mcg by mouth daily before breakfast.      STOP taking these medications     amLODipine (NORVASC) 10 MG tablet      metoprolol tartrate (LOPRESSOR) 25 MG tablet      nitrofurantoin, macrocrystal-monohydrate, (MACROBID) 100 MG capsule         ALLERGIES:   Allergies  Allergen Reactions  . Fosamax [Alendronate Sodium] Other (See Comments)    "Pain to legs" per pt's family member    BRIEF HPI:  See H&P, Labs, Consult and Test reports for all details in brief, patient was admitted for evaluation of shortness of breath-felt to have acute diastolic heart failure and admitted for further evaluation and treatment.  CONSULTATIONS:   None  PERTINENT RADIOLOGIC STUDIES: Dg Chest 2 View  03/06/2015   CLINICAL DATA:  Shortness of breath, CHF, hypertension  EXAM: CHEST  2 VIEW  COMPARISON:  02/27/2015  FINDINGS: Bilateral diffuse mild interstitial thickening. Small left pleural effusion. Trace right pleural effusion. No pneumothorax. No focal consolidation. Stable cardiomegaly. Enlargement of the central pulmonary vasculature. No acute osseous abnormality.  IMPRESSION: Findings most concerning for mild CHF.   Electronically Signed   By: Kathreen Devoid   On: 03/06/2015 11:41   Nm Pulmonary Perf And Vent  03/08/2015   CLINICAL DATA:  Short of breath with wheezing for the past 2 days. History of CHF.  EXAM: NUCLEAR MEDICINE VENTILATION - PERFUSION LUNG SCAN  TECHNIQUE: Ventilation images were obtained in multiple projections  using inhaled aerosol Tc-24m DTPA. Perfusion images were obtained in multiple projections after intravenous injection of Tc-88m MAA.  RADIOPHARMACEUTICALS:  37.7 mCi of Technetium-21m DTPA aerosol inhalation and 5.8 mCi of Technetium-57m MAA IV  COMPARISON:  Chest radiograph, 03/08/2015  FINDINGS: Ventilation: No focal ventilation defect.  Perfusion: No wedge shaped peripheral perfusion defects to suggest acute pulmonary embolism.  IMPRESSION: Normal  ventilation-perfusion study. No evidence of a pulmonary thromboembolism.   Electronically Signed   By: Lajean Manes M.D.   On: 03/08/2015 14:20   Dg Chest Port 1 View  03/08/2015   CLINICAL DATA:  Shortness of breath.  EXAM: PORTABLE CHEST - 1 VIEW  COMPARISON:  03/07/2015, 03/06/2015 and 02/27/2015  FINDINGS: Chronic cardiomegaly. Tortuosity of the thoracic aorta. Pulmonary vascularity is normal. No infiltrates. Small left effusion persists. No acute osseous abnormality.  IMPRESSION: No significant change.  Persistent small left effusion.   Electronically Signed   By: Lorriane Shire M.D.   On: 03/08/2015 08:09   Dg Chest Port 1 View  03/07/2015   CLINICAL DATA:  Shortness of breath and pulmonary edema.  EXAM: PORTABLE CHEST - 1 VIEW  COMPARISON:  03/06/2015  FINDINGS: Heart size is upper limits of normal but unchanged. Stable blunting at the left costophrenic angle suggests a small pleural effusion with atelectasis. There is mild central peribronchial thickening which could represent mild interstitial edema.  IMPRESSION: Slightly improved lung aeration compared to the prior examination. Small left pleural effusion and question mild interstitial edema.   Electronically Signed   By: Markus Daft M.D.   On: 03/07/2015 07:37     PERTINENT LAB RESULTS: CBC:  Recent Labs  03/09/15 0250  WBC 11.5*  HGB 9.2*  HCT 27.3*  PLT 426*   CMET CMP     Component Value Date/Time   NA 136 03/10/2015 0251   K 4.0 03/10/2015 0251   CL 98* 03/10/2015 0251   CO2 28 03/10/2015 0251   GLUCOSE 105* 03/10/2015 0251   BUN 37* 03/10/2015 0251   CREATININE 1.64* 03/10/2015 0251   CALCIUM 9.3 03/10/2015 0251   PROT 6.5 03/07/2015 0557   ALBUMIN 3.2* 03/07/2015 0557   AST 22 03/07/2015 0557   ALT 21 03/07/2015 0557   ALKPHOS 48 03/07/2015 0557   BILITOT 0.5 03/07/2015 0557   GFRNONAA 26* 03/10/2015 0251   GFRAA 30* 03/10/2015 0251    GFR Estimated Creatinine Clearance: 16.4 mL/min (by C-G formula based  on Cr of 1.64). No results for input(s): LIPASE, AMYLASE in the last 72 hours. No results for input(s): CKTOTAL, CKMB, CKMBINDEX, TROPONINI in the last 72 hours. Invalid input(s): POCBNP No results for input(s): DDIMER in the last 72 hours. No results for input(s): HGBA1C in the last 72 hours. No results for input(s): CHOL, HDL, LDLCALC, TRIG, CHOLHDL, LDLDIRECT in the last 72 hours. No results for input(s): TSH, T4TOTAL, T3FREE, THYROIDAB in the last 72 hours.  Invalid input(s): FREET3 No results for input(s): VITAMINB12, FOLATE, FERRITIN, TIBC, IRON, RETICCTPCT in the last 72 hours. Coags: No results for input(s): INR in the last 72 hours.  Invalid input(s): PT Microbiology: No results found for this or any previous visit (from the past 240 hour(s)).   BRIEF HOSPITAL COURSE:   Principal Problem:  Acute diastolic heart failure: Significantly improved and much more compensated following IV Lasix, required 40 mg of IV Lasix 3 times a day, transitioned to oral Lasix 40 mg twice a day on 8/12-weight this morning down to 129 pounds (138 lbs  on admit), negative balance of 5.7 L so far. However creatinine has increased slightly, we will further decrease Lasix to 40 mg in the morning, and 20 mg in the evening. She will need close monitoring of her electrolytes and adjustment of her Lasix dose accordingly. Family and patient is aware that we might have to accept some elevation of creatinine to keep her euvolemic. Family does not want aggressive care, goals of care are mostly to keep her comfortable and breathing well. DO NOT RESUSCITATE in place. Family requests that she follow up with Dr. Heather Roberts asked family to ensure that they call cardiology office on Monday for follow-up.  Active Problems: Acute hypoxic respiratory failure: Secondary to above. Resolved following IV Lasix. VQ scan low probability for pulmonary embolism, recent Doppler done at [facility] on 02/28/15  negative (reviewed care everywhere)  Acute on chronic kidney disease stage III: Creatinine slightly elevated at 1.64 on the day of discharge, baseline creatinine around 1.3-1.4. Have decreased Lasix slightly, please check electrolytes in a week and adjust dosing of diuretics.  Hyponatremia: Secondary to hypervolemia/CHF. Resolved.  Mild to moderate aortic stenosis: Outpatient monitoring, given age/frailty-doubt candidate for intervention.  Hypokalemia: Secondary to diuretics, potassium normal at the time of discharge. On potassium supplementation. Continue to check electrolytes periodically.  Palliative care:DNR, family aware that patient not candidate for aggressive care. Goals are to keep her where diuresed and euvolemic so she can be comfortable. Please consider palliative care follow-up while at SNF. Please consider home hospice follow-up when discharged from SNF   TODAY-DAY OF DISCHARGE:  Subjective:   Tina Weaver today has no headache,no chest abdominal pain,no new weakness tingling or numbness, feels much better. She appears very comfortable, no longer tachypneic and  easily speaking in full sentences.  Objective:   Blood pressure 165/56, pulse 81, temperature 98 F (36.7 C), temperature source Oral, resp. rate 18, height 4\' 11"  (1.499 m), weight 58.931 kg (129 lb 14.7 oz), SpO2 95 %.  Intake/Output Summary (Last 24 hours) at 03/10/15 0918 Last data filed at 03/10/15 0910  Gross per 24 hour  Intake   1200 ml  Output   2450 ml  Net  -1250 ml   Filed Weights   03/08/15 0529 03/09/15 0430 03/10/15 0511  Weight: 59.6 kg (131 lb 6.3 oz) 59.8 kg (131 lb 13.4 oz) 58.931 kg (129 lb 14.7 oz)    Exam Awake Alert, Oriented *3, No new F.N deficits, Normal affect [place].AT,PERRAL Supple Neck,No JVD, No cervical lymphadenopathy appriciated.  Symmetrical Chest wall movement, Good air movement bilaterally, CTAB RRR,No Gallops,Rubs or new Murmurs, No Parasternal Heave +ve B.Sounds, Abd  Soft, Non tender, No organomegaly appriciated, No rebound -guarding or rigidity. No Cyanosis, Clubbing or edema, No new Rash or bruise  DISCHARGE CONDITION: Stable  DISPOSITION: SNF  DISCHARGE INSTRUCTIONS:    Activity:  As tolerated with Full fall precautions use walker/cane & assistance as needed  Diet recommendation: Heart Healthy diet Fluid restriction 1.5 lit/day  CODE STATUS:DNR  Discharge Instructions    (HEART FAILURE PATIENTS) Call MD:  Anytime you have any of the following symptoms: 1) 3 pound weight gain in 24 hours or 5 pounds in 1 week 2) shortness of breath, with or without a dry hacking cough 3) swelling in the hands, feet or stomach 4) if you have to sleep on extra pillows at night in order to breathe.    Complete by:  As directed      Avoid straining  Complete by:  As directed      Beta Blocker already ordered    Complete by:  As directed      Contraindication to ARB at discharge    Complete by:  As directed      Diet - low sodium heart healthy    Complete by:  As directed      Heart Failure patients record your daily weight using the same scale at the same time of day    Complete by:  As directed      Increase activity slowly    Complete by:  As directed      Increase activity slowly    Complete by:  As directed      STOP any activity that causes chest pain, shortness of breath, dizziness, sweating, or exessive weakness    Complete by:  As directed            Follow-up Information    Please follow up.   Why:  Family will schedule an appointment with PCP once they decide on the provider      Follow up with Sinclair Grooms, MD. Schedule an appointment as soon as possible for a visit in 2 weeks.   Specialty:  Cardiology   Contact information:   [address] [address] [phone]       Schedule an appointment as soon as possible for a visit in 2 weeks to follow up.   Contact information:   Primary Care MD       Total Time spent on discharge equals  45 minutes.  SignedOren Binet 03/10/2015 9:18 AM

## 2015-03-10 NOTE — Discharge Instructions (Signed)

## 2015-03-12 ENCOUNTER — Encounter: Payer: Self-pay | Admitting: Internal Medicine

## 2015-03-12 ENCOUNTER — Non-Acute Institutional Stay (SKILLED_NURSING_FACILITY): Payer: Medicare HMO | Admitting: Internal Medicine

## 2015-03-12 DIAGNOSIS — N183 Chronic kidney disease, stage 3 unspecified: Secondary | ICD-10-CM

## 2015-03-12 DIAGNOSIS — E039 Hypothyroidism, unspecified: Secondary | ICD-10-CM | POA: Diagnosis not present

## 2015-03-12 DIAGNOSIS — D72829 Elevated white blood cell count, unspecified: Secondary | ICD-10-CM

## 2015-03-12 DIAGNOSIS — D638 Anemia in other chronic diseases classified elsewhere: Secondary | ICD-10-CM

## 2015-03-12 DIAGNOSIS — R5381 Other malaise: Secondary | ICD-10-CM | POA: Diagnosis not present

## 2015-03-12 DIAGNOSIS — I5033 Acute on chronic diastolic (congestive) heart failure: Secondary | ICD-10-CM | POA: Diagnosis not present

## 2015-03-12 DIAGNOSIS — E876 Hypokalemia: Secondary | ICD-10-CM | POA: Diagnosis not present

## 2015-03-12 DIAGNOSIS — E785 Hyperlipidemia, unspecified: Secondary | ICD-10-CM

## 2015-03-12 DIAGNOSIS — I10 Essential (primary) hypertension: Secondary | ICD-10-CM

## 2015-03-12 NOTE — Progress Notes (Signed)
Patient ID: Tina Weaver, female   DOB: 01/22/1919, 81 y.o.   MRN: [id number]     [facility]  PCP: No primary care provider on file.  Code Status: DNR  Allergies  Allergen Reactions  . Fosamax [Alendronate Sodium] Other (See Comments)    "Pain to legs" per pt's family member    Chief Complaint  Patient presents with  . New Admit To SNF    New Admission      HPI:  81 y.o. patient is here for short term rehabilitation post hospital admission from 03/06/15-03/10/15 with acute hypoxic respiratory failure from diastolic heart failure. She responded well to iv lasix, was negative 5.7 litres and was transitioned to po lasix. Her renal function worsened and her lasix dose was reduced. She has hx of ckd stage 3, aortic stenosis, HTN, CHF among others. She is seen in her room today. Her energy level is fair. Her breathing is stable. Denies any concerns.  Review of Systems:  Constitutional: Negative for fever, chills, diaphoresis.  HENT: Negative for headache, congestion, nasal discharge Eyes: Negative for eye pain, blurred vision, double vision and discharge.  Respiratory: Negative for cough, shortness of breath at rest and wheezing.   Cardiovascular: Negative for chest pain, palpitations, leg swelling.  Gastrointestinal: Negative for heartburn, nausea, vomiting, abdominal pain Genitourinary: Negative for dysuria Neurological: Negative for dizziness, tingling, focal weakness Psychiatric/Behavioral: Negative for depression   Past Medical History  Diagnosis Date  . CHF (congestive heart failure)   . Hypertension    Past Surgical History  Procedure Laterality Date  . Abdominal hysterectomy     Social History:   reports that she has never smoked. She does not have any smokeless tobacco history on file. She reports that she does not drink alcohol. Her drug history is not on file.  Family History  Problem Relation Age of Onset  . Heart attack Father 60    HEART  DISEASE  . Cancer Mother 26  . Heart attack Brother   . Cirrhosis Sister   . Other Sister     HEALTHY  . Other Sister     HEALTHY  . Hyperlipidemia Son   . Hyperlipidemia Son     Medications:   Medication List       This list is accurate as of: 03/12/15 11:59 PM.  Always use your most recent med list.               albuterol (2.5 MG/3ML) 0.083% nebulizer solution  Commonly known as:  PROVENTIL  Take 3 mLs (2.5 mg total) by nebulization every 2 (two) hours as needed for shortness of breath.     aspirin EC 81 MG tablet  Take 81 mg by mouth daily.     atorvastatin 20 MG tablet  Commonly known as:  LIPITOR  Take 20 mg by mouth daily.     furosemide 20 MG tablet  Commonly known as:  LASIX  Take 1 tablet (20 mg total) by mouth every evening.     furosemide 40 MG tablet  Commonly known as:  LASIX  Take 1 tablet (40 mg total) by mouth every morning.     levothyroxine 100 MCG tablet  Commonly known as:  SYNTHROID, LEVOTHROID  Take 100 mcg by mouth daily before breakfast.     potassium chloride SA 20 MEQ tablet  Commonly known as:  K-DUR,KLOR-CON  Take 1 tablet (20 mEq total) by mouth 2 (two) times daily.  Physical Exam: Filed Vitals:   03/12/15 0949  BP: 149/68  Pulse: 61  Temp: 99 F (37.2 C)  TempSrc: Oral  Resp: 16  Height: 4\' 11"  (1.499 m)  Weight: 129 lb (58.514 kg)  SpO2: 98%    General- elderly female, in no acute distress Head- normocephalic, atraumatic Nose- normal nasal mucosa, no maxillary or frontal sinus tenderness, no nasal discharge Throat- moist mucus membrane Eyes- PERRLA, EOMI, no pallor, no icterus, no discharge, normal conjunctiva, normal sclera Neck- no cervical lymphadenopathy, no jugular vein distension Cardiovascular- normal s1,s2, no murmurs, palpable dorsalis pedis and radial pulses, no leg edema Respiratory- bilateral clear to auscultation, no wheeze, no rhonchi, no crackles, no use of accessory muscles Abdomen- bowel  sounds present, soft, non tender Musculoskeletal- able to move all 4 extremities, generalized weakness, using walker Neurological- no focal deficit, alert and oriented  Skin- warm and dry Psychiatry- normal mood and affect    Labs reviewed: Basic Metabolic Panel:  Recent Labs  03/08/15 0526 03/09/15 0250 03/10/15 0251  NA 135 134* 136  K 3.4* 3.2* 4.0  CL 97* 95* 98*  CO2 25 27 28   GLUCOSE 169* 169* 105*  BUN 34* 39* 37*  CREATININE 1.48* 1.54* 1.64*  CALCIUM 9.1 9.0 9.3   Liver Function Tests:  Recent Labs  03/07/15 0557  AST 22  ALT 21  ALKPHOS 48  BILITOT 0.5  PROT 6.5  ALBUMIN 3.2*   No results for input(s): LIPASE, AMYLASE in the last 8760 hours. No results for input(s): AMMONIA in the last 8760 hours. CBC:  Recent Labs  03/06/15 1038 03/06/15 2237 03/09/15 0250  WBC 9.8 5.9 11.5*  HGB 9.4* 8.5* 9.2*  HCT 26.7* 24.6* 27.3*  MCV 91.1 91.8 93.5  PLT 298 274 426*   Cardiac Enzymes:  Recent Labs  03/06/15 1752 03/06/15 2237 03/07/15 0601  TROPONINI 0.08* 0.06* 0.04*   BNP: Invalid input(s): POCBNP CBG: No results for input(s): GLUCAP in the last 8760 hours.  Radiological Exams: Dg Chest 2 View  03/06/2015   CLINICAL DATA:  Shortness of breath, CHF, hypertension  EXAM: CHEST  2 VIEW  COMPARISON:  02/27/2015  FINDINGS: Bilateral diffuse mild interstitial thickening. Small left pleural effusion. Trace right pleural effusion. No pneumothorax. No focal consolidation. Stable cardiomegaly. Enlargement of the central pulmonary vasculature. No acute osseous abnormality.  IMPRESSION: Findings most concerning for mild CHF.   Electronically Signed   By: Kathreen Devoid   On: 03/06/2015 11:41   Dg Chest Port 1 View  03/07/2015   CLINICAL DATA:  Shortness of breath and pulmonary edema.  EXAM: PORTABLE CHEST - 1 VIEW  COMPARISON:  03/06/2015  FINDINGS: Heart size is upper limits of normal but unchanged. Stable blunting at the left costophrenic angle suggests a  small pleural effusion with atelectasis. There is mild central peribronchial thickening which could represent mild interstitial edema.  IMPRESSION: Slightly improved lung aeration compared to the prior examination. Small left pleural effusion and question mild interstitial edema.   Electronically Signed   By: Markus Daft M.D.   On: 03/07/2015 07:37    Assessment/Plan  Physical deconditioning Will have patient work with PT/OT as tolerated to regain strength and restore function.  Fall precautions are in place.  Diastolic chf Recent exacerbation. Clinically improved post diuresis. Monitor daily weight. Continue lasix 40 mg am and 20 mg pm. Monitor clinically  HTN Elevated SBP, monitor bp q shift for now. Continue lasix for now and if BP remains elevated consider b  blocker  ckd stage 3 Monitor renal function  Leukocytosis Likely reactive leukocytosis, afebrile, monitor clinically  Anemia of chronic disease Monitor h&h  HLD Continue lipitor 20 mg daily  Hypothyroidism Continue home regimen levothyroxine, no changes made  Hypokalemia Continue kcl 20 meq bid, monitor bmp   Goals of care: short term rehabilitation   Labs/tests ordered: cbc, bmp  Family/ staff Communication: reviewed care plan with patient and nursing supervisor    Blanchie Serve, MD  [facility] [phone] (Monday-Friday 8 am - 5 pm) [phone] (afterhours)

## 2015-03-13 ENCOUNTER — Other Ambulatory Visit: Payer: Self-pay | Admitting: Internal Medicine

## 2015-03-14 ENCOUNTER — Non-Acute Institutional Stay (SKILLED_NURSING_FACILITY): Payer: Medicare HMO | Admitting: Nurse Practitioner

## 2015-03-14 DIAGNOSIS — D638 Anemia in other chronic diseases classified elsewhere: Secondary | ICD-10-CM

## 2015-03-14 DIAGNOSIS — E039 Hypothyroidism, unspecified: Secondary | ICD-10-CM

## 2015-03-14 DIAGNOSIS — I10 Essential (primary) hypertension: Secondary | ICD-10-CM

## 2015-03-14 DIAGNOSIS — E871 Hypo-osmolality and hyponatremia: Secondary | ICD-10-CM

## 2015-03-14 DIAGNOSIS — I5031 Acute diastolic (congestive) heart failure: Secondary | ICD-10-CM

## 2015-03-14 DIAGNOSIS — I422 Other hypertrophic cardiomyopathy: Secondary | ICD-10-CM

## 2015-03-14 DIAGNOSIS — N183 Chronic kidney disease, stage 3 unspecified: Secondary | ICD-10-CM

## 2015-03-14 DIAGNOSIS — I421 Obstructive hypertrophic cardiomyopathy: Secondary | ICD-10-CM

## 2015-03-14 NOTE — Progress Notes (Signed)
Patient ID: Tina Weaver, female   DOB: May 18, 1919, 81 y.o.   MRN: [id number]    Nursing Home Location:  [place] of Service: SNF (31)  PCP: No primary care provider on file.  Allergies  Allergen Reactions  . Fosamax [Alendronate Sodium] Other (See Comments)    "Pain to legs" per pt's family member    Chief Complaint  Patient presents with  . Medical Management of Chronic Issues    HPI:  Patient is a 81 y.o. female seen today at [facility] for discharge home. Pt with a history of CHF, CKD, HTN, AS, hyponatremia.  Pt is at [facility] place after hospitalization for CHF exacerbation. Improved with IV lasix that was transitioned to oral. Pt down 5.7 L during hospitalization. Breathing has significantly improved. Patient currently doing well with therapy, now stable to discharge home with home health  Review of Systems:  Review of Systems  Constitutional: Negative for activity change, appetite change, fatigue and unexpected weight change.  HENT: Negative for congestion and hearing loss.   Eyes: Negative.   Respiratory: Negative for cough and shortness of breath.   Cardiovascular: Negative for chest pain, palpitations and leg swelling.  Gastrointestinal: Negative for abdominal pain, diarrhea and constipation.  Genitourinary: Negative for dysuria and difficulty urinating.  Musculoskeletal: Negative for myalgias and arthralgias.  Skin: Negative for color change and wound.  Neurological: Negative for dizziness and weakness.  Psychiatric/Behavioral: Negative for behavioral problems, confusion and agitation.    Past Medical History  Diagnosis Date  . CHF (congestive heart failure)   . Hypertension    Past Surgical History  Procedure Laterality Date  . Abdominal hysterectomy     Social History:   reports that she has never smoked. She does not have any smokeless tobacco history on file. She reports that she does not drink alcohol. Her  drug history is not on file.  No family history on file.  Medications: Patient's Medications  New Prescriptions   No medications on file  Previous Medications   ALBUTEROL (PROVENTIL) (2.5 MG/3ML) 0.083% NEBULIZER SOLUTION    Take 3 mLs (2.5 mg total) by nebulization every 2 (two) hours as needed for shortness of breath.   ASPIRIN EC 81 MG TABLET    Take 81 mg by mouth daily.   ATORVASTATIN (LIPITOR) 20 MG TABLET    Take 20 mg by mouth daily.   FUROSEMIDE (LASIX) 20 MG TABLET    Take 1 tablet (20 mg total) by mouth every evening.   FUROSEMIDE (LASIX) 40 MG TABLET    Take 1 tablet (40 mg total) by mouth every morning.   LEVOTHYROXINE (SYNTHROID, LEVOTHROID) 100 MCG TABLET    Take 100 mcg by mouth daily before breakfast.   POTASSIUM CHLORIDE SA (K-DUR,KLOR-CON) 20 MEQ TABLET    Take 1 tablet (20 mEq total) by mouth 2 (two) times daily.  Modified Medications   No medications on file  Discontinued Medications   No medications on file     Physical Exam: Filed Vitals:   03/14/15 1147  BP: 156/80  Pulse: 72  Temp: 98.6 F (37 C)  Resp: 20  SpO2: 97%    Physical Exam  Labs reviewed: Basic Metabolic Panel:  Recent Labs  03/08/15 0526 03/09/15 0250 03/10/15 0251  NA 135 134* 136  K 3.4* 3.2* 4.0  CL 97* 95* 98*  CO2 25 27 28   GLUCOSE 169* 169* 105*  BUN 34* 39* 37*  CREATININE 1.48* 1.54* 1.64*  CALCIUM 9.1 9.0 9.3   Liver Function Tests:  Recent Labs  03/07/15 0557  AST 22  ALT 21  ALKPHOS 48  BILITOT 0.5  PROT 6.5  ALBUMIN 3.2*   No results for input(s): LIPASE, AMYLASE in the last 8760 hours. No results for input(s): AMMONIA in the last 8760 hours. CBC:  Recent Labs  03/06/15 1038 03/06/15 2237 03/09/15 0250  WBC 9.8 5.9 11.5*  HGB 9.4* 8.5* 9.2*  HCT 26.7* 24.6* 27.3*  MCV 91.1 91.8 93.5  PLT 298 274 426*   TSH: No results for input(s): TSH in the last 8760 hours. A1C: No results found for: HGBA1C Lipid Panel: No results for input(s):  CHOL, HDL, LDLCALC, TRIG, CHOLHDL, LDLDIRECT in the last 8760 hours.  Radiological Exams: Dg Chest 2 View  03/06/2015   CLINICAL DATA:  Shortness of breath, CHF, hypertension  EXAM: CHEST  2 VIEW  COMPARISON:  02/27/2015  FINDINGS: Bilateral diffuse mild interstitial thickening. Small left pleural effusion. Trace right pleural effusion. No pneumothorax. No focal consolidation. Stable cardiomegaly. Enlargement of the central pulmonary vasculature. No acute osseous abnormality.  IMPRESSION: Findings most concerning for mild CHF.   Electronically Signed   By: Kathreen Devoid   On: 03/06/2015 11:41   Dg Chest Port 1 View  03/07/2015   CLINICAL DATA:  Shortness of breath and pulmonary edema.  EXAM: PORTABLE CHEST - 1 VIEW  COMPARISON:  03/06/2015  FINDINGS: Heart size is upper limits of normal but unchanged. Stable blunting at the left costophrenic angle suggests a small pleural effusion with atelectasis. There is mild central peribronchial thickening which could represent mild interstitial edema.  IMPRESSION: Slightly improved lung aeration compared to the prior examination. Small left pleural effusion and question mild interstitial edema.   Electronically Signed   By: Markus Daft M.D.   On: 03/07/2015 07:37    Assessment/Plan 1. Acute diastolic heart failure secondary to hypertrophic cardiomyopathy Improved with IV lasix in hospital. Weight has been stable. conts on lasix twice daily. Euvolemic at this time. To cont and follow up with cardiologist.   2. Essential hypertension slightly elevated today however sbp variable between 133-156. Conts on lasix only  3. Hypothyroidism, unspecified hypothyroidism type conts on home levothyroxine 100 mcg daily    4. CKD (chronic kidney disease), stage III Creatinine slightly elevated at 1.64 on the day of discharge from hospital, baseline creatinine around 1.3-1.4.  Lasix was reduced -will need ongoing follow up by PCP  5. Anemia of chronic disease Stable, hgb  9.2 in hospital, will need ongoing follow up by PCP  6. Acute on chronic hyponatremia Improvement in hospital, will need follow up by PCP   pt is stable for discharge-will need PT/OT per home health. No DME needed. Rx written.  will need to follow up with PCP within 2 weeks.    Carlos American. Harle Battiest  [facility] [phone] 8 am - 5 pm) [phone] (after hours)

## 2015-03-21 DIAGNOSIS — Q230 Congenital stenosis of aortic valve: Secondary | ICD-10-CM | POA: Diagnosis not present

## 2015-03-21 DIAGNOSIS — Q233 Congenital mitral insufficiency: Secondary | ICD-10-CM | POA: Diagnosis not present

## 2015-03-21 DIAGNOSIS — I422 Other hypertrophic cardiomyopathy: Secondary | ICD-10-CM | POA: Diagnosis not present

## 2015-03-21 DIAGNOSIS — I10 Essential (primary) hypertension: Secondary | ICD-10-CM | POA: Diagnosis not present

## 2015-03-21 DIAGNOSIS — I5032 Chronic diastolic (congestive) heart failure: Secondary | ICD-10-CM | POA: Diagnosis not present

## 2015-03-27 ENCOUNTER — Telehealth: Payer: Self-pay | Admitting: Interventional Cardiology

## 2015-03-27 NOTE — Telephone Encounter (Signed)
New message     Nurse states pt b/p was 187/78 this morning; pulse was 83  Nurse took b/p 3 different times and b/p readings were the same Please call to discuss

## 2015-03-27 NOTE — Telephone Encounter (Signed)
Patient has not been seen in our office and will be a new patient for Dr. Irish Lack this coming week. Advised for nurse, Ivar Drape,  with [facility] to call patient's PCP for elevated BP. Ivin Booty verbalized understanding and will call patient's PCP.

## 2015-03-30 ENCOUNTER — Ambulatory Visit (INDEPENDENT_AMBULATORY_CARE_PROVIDER_SITE_OTHER): Payer: Medicare HMO | Admitting: Interventional Cardiology

## 2015-03-30 ENCOUNTER — Encounter: Payer: Self-pay | Admitting: Interventional Cardiology

## 2015-03-30 VITALS — BP 110/70 | HR 93 | Ht 59.0 in | Wt 128.8 lb

## 2015-03-30 DIAGNOSIS — I350 Nonrheumatic aortic (valve) stenosis: Secondary | ICD-10-CM | POA: Diagnosis not present

## 2015-03-30 DIAGNOSIS — I10 Essential (primary) hypertension: Secondary | ICD-10-CM

## 2015-03-30 DIAGNOSIS — I5031 Acute diastolic (congestive) heart failure: Secondary | ICD-10-CM

## 2015-03-30 DIAGNOSIS — I421 Obstructive hypertrophic cardiomyopathy: Secondary | ICD-10-CM

## 2015-03-30 DIAGNOSIS — I422 Other hypertrophic cardiomyopathy: Secondary | ICD-10-CM

## 2015-03-30 MED ORDER — CARVEDILOL 3.125 MG PO TABS: 3.1250 mg | ORAL_TABLET | Freq: Two times a day (BID) | ORAL | Status: DC

## 2015-03-30 NOTE — Progress Notes (Signed)
Patient ID: Tina Weaver, female   DOB: 1918-11-01, 81 y.o.   MRN: [id number]     Cardiology Office Note   Date:  03/30/2015   ID:  Tina Weaver, DOB 10-28-1918, MRN [id number]  PCP:  No primary care provider on file.    No chief complaint on file. Diastolic heart failure   Wt Readings from Last 3 Encounters:  03/30/15 128 lb 12.8 oz (58.423 kg)  03/12/15 129 lb (58.514 kg)  03/10/15 129 lb 14.7 oz (58.931 kg)       History of Present Illness: Tina Weaver is a 81 y.o. female   Who had elevated BP and passed out on January 10, 2015.  She was admitted and discharged after an echo.  She was admitted with what sounds like diastolic heart failure and fluid retention.  The first admissions were at [facility] regional.    She had been on metoprolol and amlodipine. She had recurrent swelling and shortness of breath.  She came   To [facility] in 8/16.  Echocardiogram showed hypertrophic cardiomyopathy. She had medicine adjustment. She was diuresed nearly 10 pounds. She was sent home on Lasix. She has been weighing herself daily. Her weight has been stable. She has stuck to a low-salt diet.  She has done well since leaving the hospital.      Past Medical History  Diagnosis Date  . CHF (congestive heart failure)   . Hypertension     Past Surgical History  Procedure Laterality Date  . Abdominal hysterectomy       Current Outpatient Prescriptions  Medication Sig Dispense Refill  . aspirin EC 81 MG tablet Take 81 mg by mouth daily.    Marland Kitchen atorvastatin (LIPITOR) 20 MG tablet Take 20 mg by mouth daily.    . furosemide (LASIX) 20 MG tablet Take 1 tablet (20 mg total) by mouth every evening.    Marland Kitchen levothyroxine (SYNTHROID, LEVOTHROID) 100 MCG tablet Take 100 mcg by mouth daily before breakfast.    . potassium chloride SA (K-DUR,KLOR-CON) 20 MEQ tablet Take 1 tablet (20 mEq total) by mouth 2 (two) times daily.    . carvedilol (COREG) 3.125 MG tablet Take 1 tablet (3.125 mg total) by mouth 2  (two) times daily. 180 tablet 3   No current facility-administered medications for this visit.    Allergies:   Fosamax    Social History:  The patient  reports that she has never smoked. She does not have any smokeless tobacco history on file. She reports that she does not drink alcohol.   Family History:  The patient's family history includes Cancer (age of onset: 75) in her mother; Cirrhosis in her sister; Heart attack in her brother; Heart attack (age of onset: 26) in her father; Hyperlipidemia in her son and son; Other in her sister and sister.    ROS:  Please see the history of present illness.   Otherwise, review of systems are positive for intermittent shortness of breath as noted above..   All other systems are reviewed and negative.    PHYSICAL EXAM: VS:  BP 110/70 mmHg  Pulse 93  Ht 4\' 11"  (1.499 m)  Wt 128 lb 12.8 oz (58.423 kg)  BMI 26.00 kg/m2  SpO2 93% , BMI Body mass index is 26 kg/(m^2). GEN: Well nourished, well developed, in no acute distress HEENT: normal Neck: no JVD, carotid bruits, or masses Cardiac: RRR; 2/6 systolic murmurs, rubs, or gallops,no edema  Respiratory:  clear to auscultation bilaterally,  normal work of breathing GI: soft, nontender, nondistended, + BS MS: no deformity or atrophy Skin: warm and dry, no rash Neuro:  Strength and sensation are intact Psych: euthymic mood, full affect     Recent Labs: 03/07/2015: ALT 21; B Natriuretic Peptide 181.7* 03/09/2015: Hemoglobin 9.2*; Platelets 426* 03/10/2015: BUN 37*; Creatinine, Ser 1.64*; Potassium 4.0; Sodium 136   Lipid Panel No results found for: CHOL, TRIG, HDL, CHOLHDL, VLDL, LDLCALC, LDLDIRECT   Other studies Reviewed: Additional studies/ records that were reviewed today with results demonstrating: Echocardiogram revealed hyperdynamic LV function with hypertrophic cardiomyopathy.   ASSESSMENT AND PLAN:  1. Hypertrophic artery myopathy/chronic diastolic heart failure: She appears  euvolemic at this time. Continue current dose of diuretics. Continue daily weights. If her weight goes up a couple pounds, I would give her an extra 20 mg of Lasix. 2. Hypertension: I reviewed her home blood pressures which typically are in the 803-212 systolic range. Her target should be between 140 and 150. Will add carvedilol 3.125 mg by mouth twice a day.  This should increase diastolic filling time.  If blood pressure consistently over 248 systolic, she should contact us.  I rechecked blood pressure today and it was 160/84. 3. Hyperlipidemia: Continue atorvastatin. 4. Moderate aortic stenosis: No lightheadedness or syncope. Continue to monitor.   Current medicines are reviewed at length with the patient today.  The patient concerns regarding her medicines were addressed.  The following changes have been made:  Carvedilol added  Labs/ tests ordered today include:   Orders Placed This Encounter  Procedures  . EKG 12-Lead    Recommend 150 minutes/week of aerobic exercise Low fat, low carb, high fiber diet recommended  Disposition:   FU in 6 months   [name]., MD  03/30/2015 5:12 PM    [facility] [address], [place], [place]  [address] Phone: [phone]; Fax: [phone]

## 2015-03-30 NOTE — Patient Instructions (Signed)
Medication Instructions:  1) START taking Carvedilol 3.125mg  twice daily  Labwork: None  Testing/Procedures: None  Follow-Up: Your physician wants you to follow-up in: 6 months with Dr. Irish Lack. You will receive a reminder letter in the mail two months in advance. If you don't receive a letter, please call our office to schedule the follow-up appointment.   Any Other Special Instructions Will Be Listed Below (If Applicable).  Continue to monitor blood pressure at home and let us know if you systolic (top number) is consistently above 150.

## 2015-04-09 ENCOUNTER — Other Ambulatory Visit: Payer: Self-pay | Admitting: Internal Medicine

## 2015-04-13 ENCOUNTER — Other Ambulatory Visit: Payer: Self-pay

## 2015-04-13 ENCOUNTER — Telehealth: Payer: Self-pay | Admitting: Interventional Cardiology

## 2015-04-13 ENCOUNTER — Telehealth: Payer: Self-pay

## 2015-04-13 MED ORDER — POTASSIUM CHLORIDE CRYS ER 20 MEQ PO TBCR: 20.0000 meq | EXTENDED_RELEASE_TABLET | Freq: Two times a day (BID) | ORAL | Status: DC

## 2015-04-13 MED ORDER — FUROSEMIDE 20 MG PO TABS: 20.0000 mg | ORAL_TABLET | Freq: Every evening | ORAL | Status: DC

## 2015-04-13 NOTE — Telephone Encounter (Signed)
New message    Pt needs refills for potassium; furosemide and levothyroxine  Pt was given these prescriptions when discharged from hospital and was seen by Dr. Irish Lack at the beginning of this month

## 2015-04-13 NOTE — Telephone Encounter (Signed)
Son is asking if you would refill her Levothyroxine for a month till she can see a PCP. Son states her appointment is 09/26. Have refilled her Lasix and Potassium.

## 2015-04-13 NOTE — Telephone Encounter (Signed)
LMTCB. Dr Irish Lack has signed the pts disability placard form and I need to know if the pt wants form mailed to her address or if she wants to pick up from the office.

## 2015-04-16 ENCOUNTER — Other Ambulatory Visit: Payer: Self-pay

## 2015-04-16 MED ORDER — LEVOTHYROXINE SODIUM 100 MCG PO TABS: 100.0000 ug | ORAL_TABLET | Freq: Every day | ORAL | Status: DC

## 2015-04-16 NOTE — Telephone Encounter (Signed)
Son states that her pulse runs around 72, but BP is staying up. Systolic 122-241 range. Said you wanted to know. This is on the  Carvedilol 3.125mg  bid

## 2015-04-16 NOTE — Telephone Encounter (Signed)
See prior note. Increase carvedilol to 6.25 BID

## 2015-04-16 NOTE — Telephone Encounter (Signed)
Ok to refill for a month

## 2015-04-16 NOTE — Telephone Encounter (Signed)
I have mailed disability placard form to the pts son's [place]) address per his request in letter.

## 2015-04-16 NOTE — Telephone Encounter (Signed)
Can increase carvediloll to 6.25 mg BID

## 2015-04-17 ENCOUNTER — Encounter: Payer: Self-pay | Admitting: Interventional Cardiology

## 2015-04-17 MED ORDER — CARVEDILOL 6.25 MG PO TABS: 6.2500 mg | ORAL_TABLET | Freq: Two times a day (BID) | ORAL | Status: DC

## 2015-04-17 NOTE — Telephone Encounter (Signed)
Spoke with son and informed him of new orders. Son verbalized understanding and was appreciative for call.

## 2015-04-17 NOTE — Telephone Encounter (Addendum)
Spoke with pt and informed her of new order to increase carvedilol to 6.25mg  BID. Pt verbalized understanding and was in agreement with plan. Pt asked that I call her son Dorothyann Peng and inform him as well. Left message for Dorothyann Peng to call back.

## 2015-04-17 NOTE — Telephone Encounter (Signed)
This encounter was created in error - please disregard.

## 2015-04-17 NOTE — Telephone Encounter (Signed)
New message     Pt son returning your call

## 2015-05-02 ENCOUNTER — Encounter: Payer: Medicare HMO | Admitting: Interventional Cardiology

## 2015-05-07 ENCOUNTER — Telehealth: Payer: Self-pay | Admitting: Interventional Cardiology

## 2015-05-07 MED ORDER — CARVEDILOL 12.5 MG PO TABS: 12.5000 mg | ORAL_TABLET | Freq: Two times a day (BID) | ORAL | Status: DC

## 2015-05-07 NOTE — Telephone Encounter (Signed)
The pts son, Dorothyann Peng, states that the pts BP has been elevated for the past couple of weeks. Her BP's are as follows: 10/6 @10 :00 am BP= 166/63 HR= 78 10/6 @ 8:45 pm  BP=183/69  HR= 66 10/7 @ 10:05 am BP= 171/66 HR= 70 10/7 @ 6:15 pm  BP= 167/67  HR= 70 10/8 @ 10:35 am BP= 174/69 HR= 77 10/9 @ 9:50 am BP= 178/67  HR= 71 10/10 @ 10:10 am BP=206/80 HR=62  Dorothyann Peng states that the pt takes her Carvedilol 6.25 mg at 8 am every morning and at 5 pm every evening.

## 2015-05-07 NOTE — Telephone Encounter (Signed)
New Message       Pt's son calling stating that he needs to talk to a nurse about pt's BP, will not elaborate. Please call back and advise.

## 2015-05-07 NOTE — Telephone Encounter (Signed)
**Note De-Identified Elliot Simoneaux Obfuscation** Per Dr Marlou Porch (DOD) the pts son, Dorothyann Peng, is advised to increase the pts Carvedilol to 12.5 mg bid. He verbalized understanding and is in agreement with plan.  Also, Dorothyann Peng requested to schedule an appointment for the pt to see Dr Irish Lack if the increase in Carvedilol is not effective as he says her BP has been elevated for several weeks. Appt has been scheduled for 10/24 at 2:45 with Dr Irish Lack and Dorothyann Peng states that he will call to cancel if the increase in Carvedilol dose is effective  Carvedilol RX sent to [facility] to fill per [facility] request.

## 2015-05-15 ENCOUNTER — Emergency Department (HOSPITAL_COMMUNITY): Payer: Medicare HMO

## 2015-05-15 ENCOUNTER — Encounter (HOSPITAL_COMMUNITY): Payer: Self-pay | Admitting: *Deleted

## 2015-05-15 ENCOUNTER — Emergency Department (HOSPITAL_COMMUNITY)
Admission: EM | Admit: 2015-05-15 | Discharge: 2015-05-15 | Disposition: A | Discharge status: Home / Self Care | Payer: Medicare HMO | Attending: Emergency Medicine | Admitting: Emergency Medicine

## 2015-05-15 DIAGNOSIS — R109 Unspecified abdominal pain: Secondary | ICD-10-CM | POA: Diagnosis not present

## 2015-05-15 DIAGNOSIS — I10 Essential (primary) hypertension: Secondary | ICD-10-CM | POA: Insufficient documentation

## 2015-05-15 DIAGNOSIS — R1011 Right upper quadrant pain: Secondary | ICD-10-CM | POA: Diagnosis not present

## 2015-05-15 DIAGNOSIS — R079 Chest pain, unspecified: Secondary | ICD-10-CM | POA: Diagnosis not present

## 2015-05-15 DIAGNOSIS — Z7982 Long term (current) use of aspirin: Secondary | ICD-10-CM | POA: Diagnosis not present

## 2015-05-15 DIAGNOSIS — I509 Heart failure, unspecified: Secondary | ICD-10-CM | POA: Diagnosis not present

## 2015-05-15 DIAGNOSIS — R0682 Tachypnea, not elsewhere classified: Secondary | ICD-10-CM | POA: Diagnosis not present

## 2015-05-15 DIAGNOSIS — K2970 Gastritis, unspecified, without bleeding: Secondary | ICD-10-CM | POA: Diagnosis not present

## 2015-05-15 DIAGNOSIS — Z79899 Other long term (current) drug therapy: Secondary | ICD-10-CM | POA: Insufficient documentation

## 2015-05-15 DIAGNOSIS — R10817 Generalized abdominal tenderness: Secondary | ICD-10-CM | POA: Diagnosis not present

## 2015-05-15 DIAGNOSIS — R0600 Dyspnea, unspecified: Secondary | ICD-10-CM | POA: Diagnosis not present

## 2015-05-15 LAB — COMPREHENSIVE METABOLIC PANEL
ALK PHOS: 72 U/L (ref 38–126)
ALT: 13 U/L — AB (ref 14–54)
AST: 23 U/L (ref 15–41)
Albumin: 3.3 g/dL — ABNORMAL LOW (ref 3.5–5.0)
Anion gap: 7 (ref 5–15)
BUN: 19 mg/dL (ref 6–20)
CALCIUM: 9.4 mg/dL (ref 8.9–10.3)
CHLORIDE: 97 mmol/L — AB (ref 101–111)
CO2: 26 mmol/L (ref 22–32)
CREATININE: 0.95 mg/dL (ref 0.44–1.00)
GFR calc Af Amer: 58 mL/min — ABNORMAL LOW (ref >60)
GFR, EST NON AFRICAN AMERICAN: 50 mL/min — AB (ref >60)
Glucose, Bld: 97 mg/dL (ref 65–99)
Potassium: 4.6 mmol/L (ref 3.5–5.1)
Sodium: 130 mmol/L — ABNORMAL LOW (ref 135–145)
Total Bilirubin: 0.4 mg/dL (ref 0.3–1.2)
Total Protein: 6.5 g/dL (ref 6.5–8.1)

## 2015-05-15 LAB — URINALYSIS, ROUTINE W REFLEX MICROSCOPIC
Bilirubin Urine: NEGATIVE
GLUCOSE, UA: NEGATIVE mg/dL
Hgb urine dipstick: NEGATIVE
Ketones, ur: NEGATIVE mg/dL
LEUKOCYTES UA: NEGATIVE
Nitrite: NEGATIVE
PH: 6.5 (ref 5.0–8.0)
Protein, ur: NEGATIVE mg/dL
Specific Gravity, Urine: 1.011 (ref 1.005–1.030)
Urobilinogen, UA: 0.2 mg/dL (ref 0.0–1.0)

## 2015-05-15 LAB — CBC WITH DIFFERENTIAL/PLATELET
BASOS ABS: 0 10*3/uL (ref 0.0–0.1)
Basophils Relative: 1 %
EOS PCT: 4 %
Eosinophils Absolute: 0.4 10*3/uL (ref 0.0–0.7)
HCT: 34.5 % — ABNORMAL LOW (ref 36.0–46.0)
HEMOGLOBIN: 11.6 g/dL — AB (ref 12.0–15.0)
LYMPHS ABS: 3.2 10*3/uL (ref 0.7–4.0)
LYMPHS PCT: 41 %
MCH: 31.6 pg (ref 26.0–34.0)
MCHC: 33.6 g/dL (ref 30.0–36.0)
MCV: 94 fL (ref 78.0–100.0)
Monocytes Absolute: 1 10*3/uL (ref 0.1–1.0)
Monocytes Relative: 12 %
NEUTROS ABS: 3.4 10*3/uL (ref 1.7–7.7)
NEUTROS PCT: 42 %
PLATELETS: 403 10*3/uL — AB (ref 150–400)
RBC: 3.67 MIL/uL — AB (ref 3.87–5.11)
RDW: 13 % (ref 11.5–15.5)
WBC: 7.9 10*3/uL (ref 4.0–10.5)

## 2015-05-15 LAB — LIPASE, BLOOD: LIPASE: 28 U/L (ref 22–51)

## 2015-05-15 MED ORDER — SODIUM CHLORIDE 0.9 % IV BOLUS (SEPSIS)
500.0000 mL | Freq: Once | INTRAVENOUS | Status: AC
  Administered 2015-05-15: 500 mL via INTRAVENOUS

## 2015-05-15 MED ORDER — MORPHINE SULFATE (PF) 2 MG/ML IV SOLN
2.0000 mg | Freq: Once | INTRAVENOUS | Status: AC
  Administered 2015-05-15: 2 mg via INTRAVENOUS
  Filled 2015-05-15: qty 1

## 2015-05-15 MED ORDER — OXYCODONE-ACETAMINOPHEN 5-325 MG PO TABS: 1.0000 | ORAL_TABLET | ORAL | Status: DC | PRN

## 2015-05-15 NOTE — ED Notes (Signed)
Pt transported to xray 

## 2015-05-15 NOTE — ED Notes (Signed)
Pt begin to have epigastric/CP one week and a half ago. The pain began while she was lifting laundry detergent. She cant describe the pain she just states that it is a 8/10 pain located in the center of her chest that radiates to her right back.  Pt went to [facility] and they called EMS to transport her here.  VS per EMS are as follows: BP: 223/77 HR: 58 SPO2: 97% on RA. Resp: 24

## 2015-05-15 NOTE — ED Notes (Signed)
MD aware of BP: no meds ordered

## 2015-05-15 NOTE — Discharge Instructions (Signed)
Tests showed no life-threatening condition. Medication for pain. Follow-up your primary care doctor.

## 2015-05-15 NOTE — ED Provider Notes (Signed)
CSN: [id number]     Arrival date & time 05/15/15  1609 History   First MD Initiated Contact with Patient 05/15/15 1612     Chief Complaint  Patient presents with  . Chest Pain     (Consider location/radiation/quality/duration/timing/severity/associated sxs/prior Treatment) HPI...Marland KitchenMarland KitchenMarland Kitchen right upper quadrant pain with radiation to the right flank and right shoulder for several days after lifting a laundry detergent canister.  No substernal chest pain, dyspnea, diaphoresis, dysuria. Patient is eating normally. Nursing notes report chest pain but patient and her son did not report this to me. Severity is mild to moderate. Positioning and palpation makes symptoms worse.  Past Medical History  Diagnosis Date  . CHF (congestive heart failure) ([place])   . Hypertension    Past Surgical History  Procedure Laterality Date  . Abdominal hysterectomy     Family History  Problem Relation Age of Onset  . Heart attack Father 59    HEART DISEASE  . Cancer Mother 11  . Heart attack Brother   . Cirrhosis Sister   . Other Sister     HEALTHY  . Other Sister     HEALTHY  . Hyperlipidemia Son   . Hyperlipidemia Son    Social History  Substance Use Topics  . Smoking status: Never Smoker   . Smokeless tobacco: None  . Alcohol Use: No   OB History    No data available     Review of Systems  All other systems reviewed and are negative.     Allergies  Fosamax  Home Medications   Prior to Admission medications   Medication Sig Start Date End Date Taking? Authorizing Provider  aspirin EC 81 MG tablet Take 81 mg by mouth daily.   Yes Historical Provider, MD  atorvastatin (LIPITOR) 20 MG tablet Take 20 mg by mouth daily.   Yes Historical Provider, MD  carvedilol (COREG) 12.5 MG tablet Take 1 tablet (12.5 mg total) by mouth 2 (two) times daily. 05/07/15  Yes Jettie Booze, MD  furosemide (LASIX) 20 MG tablet Take 1 tablet (20 mg total) by mouth every evening. 04/13/15  Yes Jettie Booze, MD  levothyroxine (SYNTHROID, LEVOTHROID) 100 MCG tablet Take 1 tablet (100 mcg total) by mouth daily before breakfast. 04/16/15  Yes Jettie Booze, MD  potassium chloride SA (K-DUR,KLOR-CON) 20 MEQ tablet Take 1 tablet (20 mEq total) by mouth 2 (two) times daily. 04/13/15  Yes Jettie Booze, MD  oxyCODONE-acetaminophen (PERCOCET) 5-325 MG tablet Take 1 tablet by mouth every 4 (four) hours as needed. One half to one tablet q4h prn pain 05/15/15   Nat Christen, MD   BP 210/59 mmHg  Pulse 57  Temp(Src) 97.7 F (36.5 C) (Oral)  Resp 17  Ht 4\' 11"  (1.499 m)  Wt 126 lb (57.153 kg)  BMI 25.44 kg/m2  SpO2 99% Physical Exam  Constitutional: She is oriented to person, place, and time. She appears well-developed and well-nourished.  HENT:  Head: Normocephalic and atraumatic.  Eyes: Conjunctivae and EOM are normal. Pupils are equal, round, and reactive to light.  Neck: Normal range of motion. Neck supple.  Cardiovascular: Normal rate and regular rhythm.   Pulmonary/Chest: Effort normal and breath sounds normal.  Abdominal: Soft. Bowel sounds are normal.  Minimal right upper quadrant tenderness  Genitourinary:  Minimal right flank tenderness  Musculoskeletal: Normal range of motion.  Neurological: She is alert and oriented to person, place, and time.  Skin: Skin is warm and dry.  Psychiatric: She  has a normal mood and affect. Her behavior is normal.  Nursing note and vitals reviewed.   ED Course  Procedures (including critical care time) Labs Review Labs Reviewed  CBC WITH DIFFERENTIAL/PLATELET - Abnormal; Notable for the following:    RBC 3.67 (*)    Hemoglobin 11.6 (*)    HCT 34.5 (*)    Platelets 403 (*)    All other components within normal limits  COMPREHENSIVE METABOLIC PANEL - Abnormal; Notable for the following:    Sodium 130 (*)    Chloride 97 (*)    Albumin 3.3 (*)    ALT 13 (*)    GFR calc non Af Amer 50 (*)    GFR calc Af Amer 58 (*)    All other  components within normal limits  LIPASE, BLOOD  URINALYSIS, ROUTINE W REFLEX MICROSCOPIC (NOT AT [facility])    Imaging Review Dg Chest 2 View  05/15/2015  CLINICAL DATA:  Pt begin to have epigastric/CP one week and a half ago. The pain began while she was lifting laundry detergent. She cant describe the pain she just states that it is a 8/10 pain located in the center of her chest that radiates to her right back. EXAM: CHEST - 2 VIEW COMPARISON:  03/08/2015 and earlier studies FINDINGS: Mild cardiomegaly stable. Coarse somewhat attenuated bronchovascular markings. No focal infiltrate or overt edema. No pneumothorax. No effusion. Mid thoracic compression fracture deformity, new since previous lateral radiograph of 03/06/2015. IMPRESSION: 1. Stable mild cardiomegaly. 2. Mid thoracic compression fracture deformity, new since 03/06/2015. Electronically Signed   By: Lucrezia Europe M.D.   On: 05/15/2015 18:10   US Abdomen Limited  05/15/2015  CLINICAL DATA:  RIGHT upper quadrant pain radiating to RIGHT shoulder question gallbladder disease, history CHF and hypertension EXAM: US ABDOMEN LIMITED - RIGHT UPPER QUADRANT COMPARISON:  None FINDINGS: Gallbladder: Well distended without stones or wall thickening. No pericholecystic fluid or sonographic Murphy sign. Common bile duct: Diameter: Normal caliber 3 mm diameter Liver: Minimally heterogeneous hepatic parenchymal echogenicity without discrete mass or nodularity. Hepatopetal portal venous flow. No RIGHT upper quadrant free fluid. IMPRESSION: Negative RIGHT upper quadrant ultrasound. Electronically Signed   By: Lavonia Dana M.D.   On: 05/15/2015 18:02   I have personally reviewed and evaluated these images and lab results as part of my medical decision-making.   EKG Interpretation   Date/Time:  Tuesday May 15 2015 16:21:14 EDT Ventricular Rate:  55 PR Interval:  179 QRS Duration: 71 QT Interval:  449 QTC Calculation: 429 R Axis:   4 Text Interpretation:   Sinus rhythm Probable anterior infarct, old ST  elevation, consider inferior injury Confirmed by Mayvis Agudelo  MD, Maite Burlison ([id number])  on 05/15/2015 4:40:47 PM      MDM   Final diagnoses:  RUQ pain    Patient has systolic hypertension, but diastolic reading is normal. She is alert. Screening tests including labs, chest x-ray, ultrasound of gallbladder, EKG, UA all normal. Discharge medication Percocet. She has primary care follow-up.    Nat Christen, MD 05/15/15 [phone]

## 2015-05-21 ENCOUNTER — Ambulatory Visit (INDEPENDENT_AMBULATORY_CARE_PROVIDER_SITE_OTHER): Payer: Medicare HMO | Admitting: Interventional Cardiology

## 2015-05-21 ENCOUNTER — Encounter: Payer: Self-pay | Admitting: Interventional Cardiology

## 2015-05-21 VITALS — BP 190/50 | HR 82 | Ht 59.0 in | Wt 126.0 lb

## 2015-05-21 DIAGNOSIS — R1013 Epigastric pain: Secondary | ICD-10-CM | POA: Diagnosis not present

## 2015-05-21 DIAGNOSIS — I350 Nonrheumatic aortic (valve) stenosis: Secondary | ICD-10-CM

## 2015-05-21 DIAGNOSIS — K5900 Constipation, unspecified: Secondary | ICD-10-CM

## 2015-05-21 DIAGNOSIS — I10 Essential (primary) hypertension: Secondary | ICD-10-CM | POA: Diagnosis not present

## 2015-05-21 NOTE — Progress Notes (Signed)
Patient ID: Tina Weaver, female   DOB: Apr 14, 1919, 81 y.o.   MRN: [id number]     Cardiology Office Note   Date:  05/21/2015   ID:  Tina Weaver, DOB Aug 31, 1918, MRN [id number]  PCP:  Mathews Argyle, MD    No chief complaint on file. epigastric pain and back pain   Wt Readings from Last 3 Encounters:  05/21/15 126 lb (57.153 kg)  05/15/15 126 lb (57.153 kg)  03/30/15 128 lb 12.8 oz (58.423 kg)       History of Present Illness: Tina Weaver is a 81 y.o. female  Who had elevated BP and passed out on January 10, 2015. She was admitted and discharged after an echo. She was admitted with what sounds like diastolic heart failure and fluid retention. The first admissions were at [facility] regional.   She had been on metoprolol and amlodipine. She had recurrent swelling and shortness of breath.  She came [facility] in 8/16. Echocardiogram showed hypertrophic cardiomyopathy. She had medicine adjustment. She was diuresed nearly 10 pounds. She was sent home on Lasix. She has been weighing herself daily. Her weight has been stable. She has stuck to a low-salt diet.  She had done well since leaving the hospital, until she tried to move a large container of laundry detergent. She felt pain in her epigastric area and this radiated to the back. She went to the emergency room and was evaluated. She had a negative cardiac evaluation. She was given narcotics for pain. Since that time, her pain is improved somewhat but not completely back to normal. She has become constipated. She has pain in her lower ribs and back with deep breaths. She denies any orthopnea. She denies any leg swelling. Her weight has been stable. Her blood pressure has increased. Her carvedilol has been increased gradually. For the past week she has been on carvedilol 12.5 mg twice a day. Her heart rate has actually come down as well.       Past Medical History  Diagnosis Date  . CHF (congestive heart failure) ([place])   .  Hypertension     Past Surgical History  Procedure Laterality Date  . Abdominal hysterectomy       Current Outpatient Prescriptions  Medication Sig Dispense Refill  . aspirin EC 81 MG tablet Take 81 mg by mouth daily.    . carvedilol (COREG) 12.5 MG tablet Take 1 tablet (12.5 mg total) by mouth 2 (two) times daily. 60 tablet 3  . furosemide (LASIX) 20 MG tablet Take 1 tablet (20 mg total) by mouth every evening. 30 tablet 6  . levothyroxine (SYNTHROID, LEVOTHROID) 100 MCG tablet Take 1 tablet (100 mcg total) by mouth daily before breakfast. 30 tablet 0  . oxyCODONE-acetaminophen (PERCOCET) 5-325 MG tablet Take 1 tablet by mouth every 4 (four) hours as needed. One half to one tablet q4h prn pain 20 tablet 0  . potassium chloride SA (K-DUR,KLOR-CON) 20 MEQ tablet Take 1 tablet (20 mEq total) by mouth 2 (two) times daily. 60 tablet 5  . atorvastatin (LIPITOR) 20 MG tablet Take 20 mg by mouth daily.     No current facility-administered medications for this visit.    Allergies:   Fosamax    Social History:  The patient  reports that she has never smoked. She does not have any smokeless tobacco history on file. She reports that she does not drink alcohol.   Family History:  The patient's family history includes Cancer (age  of onset: 67) in her mother; Cirrhosis in her sister; Heart attack in her brother; Heart attack (age of onset: 56) in her father; Hyperlipidemia in her son and son; Hypertension in her sister; Other in her sister and sister; Stroke in her father.    ROS:  Please see the history of present illness.   Otherwise, review of systems are positive for epigastric pain and back pain.   All other systems are reviewed and negative.    PHYSICAL EXAM: VS:  BP 190/50 mmHg  Pulse 82  Ht 4\' 11"  (1.499 m)  Wt 126 lb (57.153 kg)  BMI 25.44 kg/m2  SpO2 99% , BMI Body mass index is 25.44 kg/(m^2). GEN: Well nourished, well developed, in no acute distress HEENT: normal Neck: no JVD,  carotid bruits, or masses Cardiac: RRR; 3/6 systolic murmur, no rubs, or gallops,no edema  Respiratory:  clear to auscultation bilaterally, normal work of breathing GI: soft, nontender, nondistended, + BS MS: no deformity or atrophy Skin: warm and dry, no rash Neuro:  Strength and sensation are intact Psych: euthymic mood, full affect   EKG:   The ekg ordered today demonstrates normal sinus rhythm, poor R-wave progression   Recent Labs: 03/07/2015: B Natriuretic Peptide 181.7* 05/15/2015: ALT 13*; BUN 19; Creatinine, Ser 0.95; Hemoglobin 11.6*; Platelets 403*; Potassium 4.6; Sodium 130*   Lipid Panel No results found for: CHOL, TRIG, HDL, CHOLHDL, VLDL, LDLCALC, LDLDIRECT   Other studies Reviewed: Additional studies/ records that were reviewed today with results demonstrating: hospital records reviewed.   ASSESSMENT AND PLAN:  1. Hypertension: It may be pain and discomfort which is causing her blood pressure to be increased. We'll not change any medications since her medicines of recently been adjusted. Have encouraged her to take Mira lax for constipation. If this resolves and her pain in the epigastrium resolves, and her blood pressure still elevated, then we would add another antihypertensive. She'll continue to follow this at home.  2. Diastolic heart failure: No evidence of fluid overload at this time. 3. Aortic stenosis: No symptoms of aortic stenosis. Continue to monitor.   Current medicines are reviewed at length with the patient today.  The patient concerns regarding her medicines were addressed.  The following changes have been made:  Okay to use over-the-counter Miralax.  Labs/ tests ordered today include:  No orders of the defined types were placed in this encounter.    Recommend 150 minutes/week of aerobic exercise Low fat, low carb, high fiber diet recommended  Disposition:   FU in as scheduled in 3/17   [name]., MD  05/21/2015 3:11 PM     [facility] [place], [place], [place]  [address] Phone: [phone]; Fax: [phone]

## 2015-05-21 NOTE — Patient Instructions (Signed)
**Note De-Identified Avaline Stillson Obfuscation** Medication Instructions:  Same-no changes. Dr Irish Lack recommends that you try over the counter Miralax    Labwork: None  Testing/Procedures: None  Follow-Up: Your physician recommends that you schedule a follow-up appointment in: as already planned  Please call Jeani Hawking at [phone] in 1 week to report your blood pressure readings.        If you need a refill on your cardiac medications before your next appointment, please call your pharmacy.

## 2015-05-30 NOTE — Addendum Note (Signed)
Addended by: Freada Bergeron on: 05/30/2015 05:18 PM   Modules accepted: Orders

## 2015-07-30 ENCOUNTER — Other Ambulatory Visit: Payer: Self-pay | Admitting: Interventional Cardiology

## 2015-07-31 DIAGNOSIS — I10 Essential (primary) hypertension: Secondary | ICD-10-CM | POA: Diagnosis not present

## 2015-07-31 DIAGNOSIS — E7800 Pure hypercholesterolemia, unspecified: Secondary | ICD-10-CM | POA: Diagnosis not present

## 2015-07-31 DIAGNOSIS — N183 Chronic kidney disease, stage 3 (moderate): Secondary | ICD-10-CM | POA: Diagnosis not present

## 2015-07-31 DIAGNOSIS — I129 Hypertensive chronic kidney disease with stage 1 through stage 4 chronic kidney disease, or unspecified chronic kidney disease: Secondary | ICD-10-CM | POA: Diagnosis not present

## 2015-07-31 DIAGNOSIS — E039 Hypothyroidism, unspecified: Secondary | ICD-10-CM | POA: Diagnosis not present

## 2015-07-31 DIAGNOSIS — I350 Nonrheumatic aortic (valve) stenosis: Secondary | ICD-10-CM | POA: Diagnosis not present

## 2015-07-31 DIAGNOSIS — Z79899 Other long term (current) drug therapy: Secondary | ICD-10-CM | POA: Diagnosis not present

## 2015-07-31 DIAGNOSIS — Z23 Encounter for immunization: Secondary | ICD-10-CM | POA: Diagnosis not present

## 2015-08-01 ENCOUNTER — Other Ambulatory Visit: Payer: Self-pay | Admitting: Internal Medicine

## 2015-08-01 NOTE — Telephone Encounter (Signed)
**Note De-identified Ardenia Stiner Obfuscation** Please refer to the pts PCP. Thanks. 

## 2015-08-01 NOTE — Telephone Encounter (Signed)
**Note De-identified Lashundra Shiveley Obfuscation** Refer to the pts PCP. Thanks. 

## 2015-08-21 NOTE — Telephone Encounter (Signed)
Her PMD should fill this.

## 2015-08-29 ENCOUNTER — Other Ambulatory Visit: Payer: Self-pay | Admitting: Interventional Cardiology

## 2015-08-29 DIAGNOSIS — Z79899 Other long term (current) drug therapy: Secondary | ICD-10-CM | POA: Diagnosis not present

## 2015-09-11 ENCOUNTER — Ambulatory Visit (INDEPENDENT_AMBULATORY_CARE_PROVIDER_SITE_OTHER): Payer: Medicare HMO | Admitting: Interventional Cardiology

## 2015-09-11 ENCOUNTER — Encounter: Payer: Self-pay | Admitting: Interventional Cardiology

## 2015-09-11 VITALS — BP 170/60 | HR 67 | Ht 59.0 in | Wt 121.2 lb

## 2015-09-11 DIAGNOSIS — I10 Essential (primary) hypertension: Secondary | ICD-10-CM

## 2015-09-11 DIAGNOSIS — I5032 Chronic diastolic (congestive) heart failure: Secondary | ICD-10-CM | POA: Diagnosis not present

## 2015-09-11 DIAGNOSIS — R0609 Other forms of dyspnea: Secondary | ICD-10-CM | POA: Diagnosis not present

## 2015-09-11 DIAGNOSIS — I350 Nonrheumatic aortic (valve) stenosis: Secondary | ICD-10-CM | POA: Diagnosis not present

## 2015-09-11 DIAGNOSIS — R0600 Dyspnea, unspecified: Secondary | ICD-10-CM

## 2015-09-11 HISTORY — DX: Dyspnea, unspecified: R06.00

## 2015-09-11 HISTORY — DX: Other forms of dyspnea: R06.09

## 2015-09-11 MED ORDER — AMLODIPINE BESYLATE 5 MG PO TABS: 5.0000 mg | ORAL_TABLET | Freq: Every day | ORAL | Status: DC

## 2015-09-11 NOTE — Patient Instructions (Signed)
**Note De-Identified Tina Weaver Obfuscation** Medication Instructions:  Start taking Amlodipine 5 mg daily-all other medications remain the same.  Labwork: None  Testing/Procedures: None  Follow-Up: Your physician wants you to follow-up in: 9 months. You will receive a reminder letter in the mail two months in advance. If you don't receive a letter, please call our office to schedule the follow-up appointment.   Any Other Special Instructions Will Be Listed Below (If Applicable). Please monitor blood pressure daily about 1 to 2 hours after you have taken your medications     If you need a refill on your cardiac medications before your next appointment, please call your pharmacy.

## 2015-09-11 NOTE — Progress Notes (Signed)
Patient ID: Tina Weaver, female   DOB: 1919/06/17, 82 y.o.   MRN: [id number]     Cardiology Office Note   Date:  09/11/2015   ID:  Tina Weaver, DOB [birth year], MRN [id number]  PCP:  Mathews Argyle, MD    Chief Complaint  Patient presents with  . Follow-up    SOB with activity     Wt Readings from Last 3 Encounters:  09/11/15 121 lb 3.2 oz (54.976 kg)  05/21/15 126 lb (57.153 kg)  05/15/15 126 lb (57.153 kg)       History of Present Illness: Tina Weaver is a 82 y.o. female  Who had elevated BP and passed out on January 10, 2015. She was admitted and discharged after an echo. She was admitted with what sounds like diastolic heart failure and fluid retention. The first admissions were at [facility].   She had been on metoprolol and amlodipine. She had recurrent swelling and shortness of breath.  She came To [facility] in 8/16. Echocardiogram showed hypertrophic cardiomyopathy. She had medicine adjustment. She was diuresed nearly 10 pounds. She was sent home on Lasix. She has been weighing herself daily. Her weight has been stable. She has stuck to a low-salt diet.  She has done well but her BP has been elevated.  No swelling of late.  Mild DOE.    Normal electrolytes with Dr. Felipa Eth on 08/29/15.  Past Medical History  Diagnosis Date  . CHF (congestive heart failure) ([place])   . Hypertension     Past Surgical History  Procedure Laterality Date  . Abdominal hysterectomy       Current Outpatient Prescriptions  Medication Sig Dispense Refill  . aspirin EC 81 MG tablet Take 81 mg by mouth daily.    . carvedilol (COREG) 12.5 MG tablet TAKE 1 TABLET BY MOUTH 2 TIMES A DAY 60 tablet 3  . furosemide (LASIX) 20 MG tablet Take 1 tablet (20 mg total) by mouth every evening. 30 tablet 6  . levothyroxine (SYNTHROID, LEVOTHROID) 100 MCG tablet Take 1 tablet (100 mcg total) by mouth daily before breakfast. 30 tablet 0  . potassium chloride SA (K-DUR,KLOR-CON) 20 MEQ  tablet TAKE 1 TABLET BY MOUTH 2 TIMES A DAY 60 tablet 1  . rosuvastatin (CRESTOR) 5 MG tablet Take 5 mg by mouth daily.     No current facility-administered medications for this visit.    Allergies:   Fosamax    Social History:  The patient  reports that she has never smoked. She does not have any smokeless tobacco history on file. She reports that she does not drink alcohol.   Family History:  The patient's family history includes Cancer (age of onset: 95) in her mother; Cirrhosis in her sister; Heart attack in her brother; Heart attack (age of onset: 55) in her father; Hyperlipidemia in her son and son; Hypertension in her sister; Other in her sister and sister; Stroke in her father.    ROS:  Please see the history of present illness.   Otherwise, review of systems are positive for DOE.   All other systems are reviewed and negative.    PHYSICAL EXAM: VS:  BP 170/60 mmHg  Pulse 67  Ht 4\' 11"  (1.499 m)  Wt 121 lb 3.2 oz (54.976 kg)  BMI 24.47 kg/m2  SpO2 95% , BMI Body mass index is 24.47 kg/(m^2). GEN: Well nourished, well developed, in no acute distress HEENT: normal Neck: no JVD,; bilateral carotid bruits, or masses  Cardiac: RRR; 3/6 systolic murmur murmurs, rubs, or gallops,no edema  Respiratory:  clear to auscultation bilaterally, normal work of breathing GI: soft, nontender, nondistended, + BS MS: no deformity or atrophy Skin: warm and dry, no rash Neuro:  Strength and sensation are intact Psych: euthymic mood, full affect     Recent Labs: 03/07/2015: B Natriuretic Peptide 181.7* 05/15/2015: ALT 13*; BUN 19; Creatinine, Ser 0.95; Hemoglobin 11.6*; Platelets 403*; Potassium 4.6; Sodium 130*   Lipid Panel No results found for: CHOL, TRIG, HDL, CHOLHDL, VLDL, LDLCALC, LDLDIRECT   Other studies Reviewed: Additional studies/ records that were reviewed today with results demonstrating:  echo.   ASSESSMENT AND PLAN:  1. HTN: Add amlodipine  to current medications.    In the past, she had swelling while on amlodipine , but she is now much better diuresis. She sticks to a low-sodium diet. I think it is worthwhile trying a low-dose of amlodipine. Continue carvedilol. Could also consider an ACE inhibitor given her normal renal function which was checked earlier in February 2017. 2.  dyspnea on exertion: Likely multifactorial from age and diastolic heart failure. Currently , she appears euvolemic. Lasix appears to be adequately treating her chronic diastolic heart failure. 3.  aortic stenosis: Moderate by most recent echocardiogram. No syncope. 4.   Her son will check her blood pressures. He'll report back to [place]. If there are any further issues, would have her follow-up with Pharm.D. Hypertension clinic.   Current medicines are reviewed at length with the patient today.  The patient concerns regarding her medicines were addressed.  The following changes have been made:    Labs/ tests ordered today include:  No orders of the defined types were placed in this encounter.    Recommend 150 minutes/week of aerobic exercise Low fat, low carb, high fiber diet recommended  Disposition:   FU in 9 months   [name]., MD  09/11/2015 10:24 AM    [facility] [place], [place], [place]  [address] Phone: [phone]; Fax: [phone]

## 2015-09-20 ENCOUNTER — Telehealth: Payer: Self-pay | Admitting: Interventional Cardiology

## 2015-09-20 MED ORDER — AMLODIPINE BESYLATE 10 MG PO TABS: 10.0000 mg | ORAL_TABLET | Freq: Every day | ORAL | Status: DC

## 2015-09-20 NOTE — Telephone Encounter (Signed)
Can increase amlodipine to 10 mg daily.  If any side effects noted, would decrease back to 5 mg daily, but not stop the medicine altogether since it has helped the BP some without signficant side effects at the lower dose.

## 2015-09-20 NOTE — Telephone Encounter (Signed)
**Note De-Identified Tina Weaver Obfuscation** The pt had an OV with Dr Irish Lack on 2/14 and was advised to start taking Amlodipine 5 mg daily due to HTN (her BP that day was 170/60). The pts son states that he was advised by Dr Irish Lack to call us if the pts systolic BP dropped or stayed the same. He called today to let us know her systolic BP has not changed and gave the following BP readings on the pt: 2/19 BP= 162/58 2/20 BP= 158/64 2/21 BP= 157/59 2/22 BP= 166/61  He reports that the pt takes her medications at 8 to 8:30 each morning and that her BP was checked at about 10:30 am.  Please advise.

## 2015-09-20 NOTE — Telephone Encounter (Signed)
New message      Calling to give an update on patient

## 2015-09-20 NOTE — Telephone Encounter (Signed)
**Note De-Identified Maydell Knoebel Obfuscation** The pts son is advised and he verbalized understanding. Per his request I have sent this RX to [facility] to fill #[phone] with 6 refills.

## 2015-09-28 ENCOUNTER — Other Ambulatory Visit: Payer: Self-pay | Admitting: Interventional Cardiology

## 2015-10-02 ENCOUNTER — Telehealth: Payer: Self-pay | Admitting: Interventional Cardiology

## 2015-10-02 DIAGNOSIS — I10 Essential (primary) hypertension: Secondary | ICD-10-CM

## 2015-10-02 MED ORDER — LISINOPRIL 10 MG PO TABS: 10.0000 mg | ORAL_TABLET | Freq: Every day | ORAL | Status: DC

## 2015-10-02 NOTE — Telephone Encounter (Signed)
New message ° ° ° ° °Talk to the nurse regarding her medication °

## 2015-10-02 NOTE — Telephone Encounter (Signed)
OK to start lisinopril 10 mg daily.  Stop amlodipine.  BMet and f/u in PharmD HTN clinic a week after the lisinopril was started.

## 2015-10-02 NOTE — Telephone Encounter (Signed)
On 2/23 Dr Irish Lack increased the pts Amlodipine dose to 10 mg due to her elevated BP readings.  The pts son, Tina Weaver, called this morning to let us know that since 3/1 (last Wednesday) the pt has been having a lot of swelling in her feet and ankles and he wants to know if this is due to Amlodipine and what they should do. Please advise.     FYI: 3/4 BP in AM was 157/56 with a HR of 55                PM was 143/62 with a HR of 66 3/5 BP in AM was 154/62 with a HR of 76                 PM was 156/56 with a HR of 64 3/6 BP in AM was 155/58 with HR of 68                 PM was 169/59 with a HR of 73

## 2015-10-02 NOTE — Telephone Encounter (Signed)
The pts son Dorothyann Peng is advised and he verbalized understanding. Amlodipine has been removed from the pts med list and Lisinopril 10 mg daily RX has been sent to [facility] to fill per [facility] request. Dorothyann Peng is aware that someone from this office will be contacting him to arrange date and time of HTN clinic apt. and he is aware that the pt will have a BMET drawn on that day as well.

## 2015-10-02 NOTE — Telephone Encounter (Signed)
**Note De-identified Rusty Villella Obfuscation** LMTCB

## 2015-10-15 ENCOUNTER — Other Ambulatory Visit: Payer: Medicare HMO

## 2015-10-15 ENCOUNTER — Ambulatory Visit: Payer: Medicare HMO | Admitting: Pharmacist

## 2015-10-29 ENCOUNTER — Other Ambulatory Visit: Payer: Self-pay | Admitting: Interventional Cardiology

## 2015-10-31 ENCOUNTER — Other Ambulatory Visit (INDEPENDENT_AMBULATORY_CARE_PROVIDER_SITE_OTHER): Payer: Medicare HMO | Admitting: *Deleted

## 2015-10-31 ENCOUNTER — Ambulatory Visit (INDEPENDENT_AMBULATORY_CARE_PROVIDER_SITE_OTHER): Payer: Medicare HMO | Admitting: Pharmacist

## 2015-10-31 VITALS — BP 164/60 | HR 62

## 2015-10-31 DIAGNOSIS — I10 Essential (primary) hypertension: Secondary | ICD-10-CM

## 2015-10-31 LAB — BASIC METABOLIC PANEL
BUN: 28 mg/dL — AB (ref 7–25)
CHLORIDE: 95 mmol/L — AB (ref 98–110)
CO2: 25 mmol/L (ref 20–31)
Calcium: 9.2 mg/dL (ref 8.6–10.4)
Creat: 1.35 mg/dL — ABNORMAL HIGH (ref 0.60–0.88)
GLUCOSE: 81 mg/dL (ref 65–99)
POTASSIUM: 5.3 mmol/L (ref 3.5–5.3)
Sodium: 130 mmol/L — ABNORMAL LOW (ref 135–146)

## 2015-10-31 NOTE — Progress Notes (Signed)
Patient ID: Tina Weaver       DOB: 1919/07/01          [id number]    HPI: Tina Weaver is a 82 y.o. female referred by Dr. Jeanie Sewer to HTN clinic. PMH includes diastolic HF/HFpEF (LVEF [id number]), aortic stenosis, and HTN. Patient recently presented to Dr. Irish Lack and amlodipine was increased to 10 mg daily. Patient called with lower extremity swelling and pt was advised to stop amlodipine and  lisinopril 10 mg daily was added. Patient decreased her dose of amlodipine to 5 mg and reports swelling has resolved since decreasing dose of amlodipine back to 5 mg daily.  Pt reports taking AM medications today.  Denies dizziness, headache, blurred vision, or orthostasis.   Current HTN meds: Lisinopril 10 mg daily, carvedilol 12.5 mg BID, Amlodipine 5 mg daily  Previously tried: Intolerance to Amlodipine (lower extremity edema with 10 mg daily)  BP goal: <150/90  Family History: Heart Attack (Father and Brother), Stroke (Father), HTN (Sister), HLD (Son), Cancer (Mother), Cirrhosis (Sister)  Social History: Denies history of smoking, EtOH, or illicit drug use.   Home BP readings:  SBP 130-170s  Mostly in 150s DBP 50s-60s HR 60-70s   Wt Readings from Last 3 Encounters:  09/11/15 121 lb 3.2 oz (54.976 kg)  05/21/15 126 lb (57.153 kg)  05/15/15 126 lb (57.153 kg)   BP Readings from Last 3 Encounters:  10/31/15 164/60  09/11/15 170/60  05/21/15 190/50   Pulse Readings from Last 3 Encounters:  10/31/15 62  09/11/15 67  05/21/15 82    Renal function: CrCl cannot be calculated (Unknown ideal weight.).  Past Medical History  Diagnosis Date  . CHF (congestive heart failure) ([place])   . Hypertension   . DOE (dyspnea on exertion) 09/11/2015   Outpatient Encounter Prescriptions as of 10/31/2015  Medication Sig  . amLODipine (NORVASC) 5 MG tablet Take 5 mg by mouth daily.  Marland Kitchen aspirin EC 81 MG tablet Take 81 mg by mouth daily.  . carvedilol (COREG) 12.5 MG tablet TAKE 1 TABLET BY MOUTH 2  TIMES A DAY  . furosemide (LASIX) 20 MG tablet TAKE 1 TABLET BY MOUTH EVERY EVENING  . levothyroxine (SYNTHROID, LEVOTHROID) 100 MCG tablet Take 1 tablet (100 mcg total) by mouth daily before breakfast.  . lisinopril (PRINIVIL,ZESTRIL) 10 MG tablet Take 1 tablet (10 mg total) by mouth daily.  . potassium chloride SA (K-DUR,KLOR-CON) 20 MEQ tablet TAKE 1 TABLET BY MOUTH TWICE DAILY  . rosuvastatin (CRESTOR) 5 MG tablet Take 5 mg by mouth daily.   No facility-administered encounter medications on file as of 10/31/2015.    Allergies  Allergen Reactions  . Norvasc [Amlodipine] Swelling    Lower extremity edema with 10 mg dose, tolerates 5mg  without problems.  . Fosamax [Alendronate Sodium] Other (See Comments)    "Pain to legs" per pt's family member    Assessment/Plan: 1. Hypertension: BP currently above goal of <150/90 on amlodipine 5 mg daily, carvedilol 12.5 mg BID, and lisinopril 10 mg daily. Given patients advanced age would not want to push BP lower than 150/90. Lower extremity edema has resolved since patient decreased dose from amlodipine 10 mg back to 5 mg daily. Will followup BMET and consider increasing dose of lisinopril.

## 2015-10-31 NOTE — Addendum Note (Signed)
Addended by: Eulis Foster on: 10/31/2015 11:31 AM   Modules accepted: Orders

## 2015-11-01 ENCOUNTER — Telehealth: Payer: Self-pay | Admitting: Pharmacist

## 2015-11-01 ENCOUNTER — Other Ambulatory Visit: Payer: Self-pay

## 2015-11-01 DIAGNOSIS — I422 Other hypertrophic cardiomyopathy: Secondary | ICD-10-CM

## 2015-11-01 DIAGNOSIS — I5031 Acute diastolic (congestive) heart failure: Principal | ICD-10-CM

## 2015-11-01 DIAGNOSIS — I10 Essential (primary) hypertension: Secondary | ICD-10-CM

## 2015-11-01 NOTE — Telephone Encounter (Signed)
New Message:  Pt's son is calling back in regards to some lab results. Please f/u with him

## 2015-11-01 NOTE — Telephone Encounter (Signed)
Spoke with pt's son.  BMET was normal but BP was elevated at visit on 4/5.  Plan was to increase lisinopril if BMET normal.  Will increase lisinopril to 20mg  daily.  Will also decrease potassium to 1 tablet daily given lab was high end of normal and may increase with increased lisinopril dose.  Plan to recheck BP and BMET in 1-2 weeks.  Son preferred appt on 4/20 due to his work schedule.  Appt made.

## 2015-11-01 NOTE — Telephone Encounter (Signed)
**Note De-Identified Caylon Saine Obfuscation** The pts son, Dorothyann Peng, is advised of the pts BMET results from 4/5. He verbalized understanding.

## 2015-11-01 NOTE — Telephone Encounter (Signed)
LMTCB

## 2015-11-09 ENCOUNTER — Other Ambulatory Visit: Payer: Self-pay | Admitting: *Deleted

## 2015-11-09 MED ORDER — LISINOPRIL 10 MG PO TABS: 20.0000 mg | ORAL_TABLET | Freq: Every day | ORAL | Status: DC

## 2015-11-09 NOTE — Telephone Encounter (Signed)
Received fax from pharmacy stating that per patient lisinopril should now be 10mg  bid. Will send in rx for the 10mg  tablet with a sig of take 20mg  qd, in case she should need to reduce dose again based on upcoming BP and BMET appointment. Call [place], [facility] at 11/01/2015 2:59 PM     Status: Signed       Expand All Collapse All   Spoke with pt's son. BMET was normal but BP was elevated at visit on 4/5. Plan was to increase lisinopril if BMET normal. Will increase lisinopril to 20mg  daily. Will also decrease potassium to 1 tablet daily given lab was high end of normal and may increase with increased lisinopril dose. Plan to recheck BP and BMET in 1-2 weeks. Son preferred appt on 4/20 due to his work schedule. Appt made.

## 2015-11-15 ENCOUNTER — Ambulatory Visit: Payer: Medicare HMO | Admitting: Pharmacist

## 2015-11-29 ENCOUNTER — Other Ambulatory Visit: Payer: Self-pay | Admitting: Interventional Cardiology

## 2015-12-27 DIAGNOSIS — I6522 Occlusion and stenosis of left carotid artery: Secondary | ICD-10-CM | POA: Diagnosis not present

## 2016-01-01 ENCOUNTER — Other Ambulatory Visit: Payer: Self-pay | Admitting: Geriatric Medicine

## 2016-01-01 DIAGNOSIS — N183 Chronic kidney disease, stage 3 (moderate): Secondary | ICD-10-CM | POA: Diagnosis not present

## 2016-01-01 DIAGNOSIS — R1084 Generalized abdominal pain: Secondary | ICD-10-CM

## 2016-01-01 DIAGNOSIS — I129 Hypertensive chronic kidney disease with stage 1 through stage 4 chronic kidney disease, or unspecified chronic kidney disease: Secondary | ICD-10-CM | POA: Diagnosis not present

## 2016-01-01 DIAGNOSIS — E611 Iron deficiency: Secondary | ICD-10-CM | POA: Diagnosis not present

## 2016-01-01 DIAGNOSIS — D539 Nutritional anemia, unspecified: Secondary | ICD-10-CM | POA: Diagnosis not present

## 2016-01-01 DIAGNOSIS — Z79899 Other long term (current) drug therapy: Secondary | ICD-10-CM | POA: Diagnosis not present

## 2016-01-03 ENCOUNTER — Other Ambulatory Visit: Payer: Self-pay | Admitting: Geriatric Medicine

## 2016-01-03 DIAGNOSIS — R1084 Generalized abdominal pain: Secondary | ICD-10-CM

## 2016-01-03 DIAGNOSIS — E611 Iron deficiency: Secondary | ICD-10-CM | POA: Diagnosis not present

## 2016-01-03 DIAGNOSIS — D649 Anemia, unspecified: Secondary | ICD-10-CM | POA: Diagnosis not present

## 2016-01-04 ENCOUNTER — Other Ambulatory Visit: Payer: Medicare HMO

## 2016-01-07 ENCOUNTER — Other Ambulatory Visit: Payer: Medicare HMO

## 2016-01-07 ENCOUNTER — Telehealth: Payer: Self-pay | Admitting: Interventional Cardiology

## 2016-01-07 NOTE — Telephone Encounter (Signed)
New message      Calling to give nurse prior ultrasound reports from when pt was in [place]

## 2016-01-07 NOTE — Telephone Encounter (Signed)
Tina Weaver, the pts son, states that the pt had a Carotid Doppler done at [facility] and he wants Dr Irish Lack to see results. He is advised that the pt will need to sign a release of information form here at this office in order for [place] to get those results. He states that he is the pts power of attorney and wants to know if he can come by the office and sign ROI form instead of the pt as she is 82 Yrs old. Please call the pts son, Tina Weaver, and advise. Thanks.

## 2016-01-11 ENCOUNTER — Ambulatory Visit
Admission: RE | Admit: 2016-01-11 | Discharge: 2016-01-11 | Disposition: A | Discharge status: Home / Self Care | Payer: Medicare HMO | Source: Ambulatory Visit | Attending: Geriatric Medicine | Admitting: Geriatric Medicine

## 2016-01-11 DIAGNOSIS — R1084 Generalized abdominal pain: Secondary | ICD-10-CM

## 2016-01-11 DIAGNOSIS — D649 Anemia, unspecified: Secondary | ICD-10-CM | POA: Diagnosis not present

## 2016-01-21 DIAGNOSIS — Z79899 Other long term (current) drug therapy: Secondary | ICD-10-CM | POA: Diagnosis not present

## 2016-01-21 DIAGNOSIS — D649 Anemia, unspecified: Secondary | ICD-10-CM | POA: Diagnosis not present

## 2016-01-30 ENCOUNTER — Ambulatory Visit
Admission: RE | Admit: 2016-01-30 | Discharge: 2016-01-30 | Disposition: A | Discharge status: Home / Self Care | Payer: Medicare HMO | Source: Ambulatory Visit | Attending: Geriatric Medicine | Admitting: Geriatric Medicine

## 2016-01-30 DIAGNOSIS — R1084 Generalized abdominal pain: Secondary | ICD-10-CM

## 2016-01-30 DIAGNOSIS — K5790 Diverticulosis of intestine, part unspecified, without perforation or abscess without bleeding: Secondary | ICD-10-CM | POA: Diagnosis not present

## 2016-01-30 MED ORDER — IOPAMIDOL (ISOVUE-300) INJECTION 61%
100.0000 mL | Freq: Once | INTRAVENOUS | Status: AC | PRN
  Administered 2016-01-30: 100 mL via INTRAVENOUS

## 2016-03-05 DIAGNOSIS — I350 Nonrheumatic aortic (valve) stenosis: Secondary | ICD-10-CM | POA: Diagnosis not present

## 2016-03-05 DIAGNOSIS — E039 Hypothyroidism, unspecified: Secondary | ICD-10-CM | POA: Diagnosis not present

## 2016-03-05 DIAGNOSIS — Z1389 Encounter for screening for other disorder: Secondary | ICD-10-CM | POA: Diagnosis not present

## 2016-03-05 DIAGNOSIS — I509 Heart failure, unspecified: Secondary | ICD-10-CM | POA: Diagnosis not present

## 2016-03-05 DIAGNOSIS — E222 Syndrome of inappropriate secretion of antidiuretic hormone: Secondary | ICD-10-CM | POA: Diagnosis not present

## 2016-03-05 DIAGNOSIS — I129 Hypertensive chronic kidney disease with stage 1 through stage 4 chronic kidney disease, or unspecified chronic kidney disease: Secondary | ICD-10-CM | POA: Diagnosis not present

## 2016-03-05 DIAGNOSIS — I700 Atherosclerosis of aorta: Secondary | ICD-10-CM | POA: Diagnosis not present

## 2016-03-05 DIAGNOSIS — Z79899 Other long term (current) drug therapy: Secondary | ICD-10-CM | POA: Diagnosis not present

## 2016-03-05 DIAGNOSIS — N183 Chronic kidney disease, stage 3 (moderate): Secondary | ICD-10-CM | POA: Diagnosis not present

## 2016-03-05 DIAGNOSIS — Z0000 Encounter for general adult medical examination without abnormal findings: Secondary | ICD-10-CM | POA: Diagnosis not present

## 2016-04-02 DIAGNOSIS — I509 Heart failure, unspecified: Secondary | ICD-10-CM | POA: Diagnosis not present

## 2016-04-02 DIAGNOSIS — E039 Hypothyroidism, unspecified: Secondary | ICD-10-CM | POA: Diagnosis not present

## 2016-04-02 DIAGNOSIS — I129 Hypertensive chronic kidney disease with stage 1 through stage 4 chronic kidney disease, or unspecified chronic kidney disease: Secondary | ICD-10-CM | POA: Diagnosis not present

## 2016-04-02 DIAGNOSIS — N183 Chronic kidney disease, stage 3 (moderate): Secondary | ICD-10-CM | POA: Diagnosis not present

## 2016-04-02 DIAGNOSIS — I350 Nonrheumatic aortic (valve) stenosis: Secondary | ICD-10-CM | POA: Diagnosis not present

## 2016-04-02 DIAGNOSIS — Z23 Encounter for immunization: Secondary | ICD-10-CM | POA: Diagnosis not present

## 2016-04-10 DIAGNOSIS — Z961 Presence of intraocular lens: Secondary | ICD-10-CM | POA: Diagnosis not present

## 2016-04-10 DIAGNOSIS — H52203 Unspecified astigmatism, bilateral: Secondary | ICD-10-CM | POA: Diagnosis not present

## 2016-04-10 DIAGNOSIS — H5203 Hypermetropia, bilateral: Secondary | ICD-10-CM | POA: Diagnosis not present

## 2016-04-10 DIAGNOSIS — H26492 Other secondary cataract, left eye: Secondary | ICD-10-CM | POA: Diagnosis not present

## 2016-05-02 ENCOUNTER — Other Ambulatory Visit (INDEPENDENT_AMBULATORY_CARE_PROVIDER_SITE_OTHER): Payer: Medicare HMO

## 2016-05-02 DIAGNOSIS — I5031 Acute diastolic (congestive) heart failure: Secondary | ICD-10-CM

## 2016-05-02 DIAGNOSIS — I422 Other hypertrophic cardiomyopathy: Secondary | ICD-10-CM | POA: Diagnosis not present

## 2016-05-02 DIAGNOSIS — I10 Essential (primary) hypertension: Secondary | ICD-10-CM

## 2016-05-02 LAB — BASIC METABOLIC PANEL
BUN: 18 mg/dL (ref 7–25)
CALCIUM: 9.3 mg/dL (ref 8.6–10.4)
CO2: 27 mmol/L (ref 20–31)
CREATININE: 1 mg/dL — AB (ref 0.60–0.88)
Chloride: 105 mmol/L (ref 98–110)
Glucose, Bld: 88 mg/dL (ref 65–99)
Potassium: 4.8 mmol/L (ref 3.5–5.3)
Sodium: 138 mmol/L (ref 135–146)

## 2016-07-23 DIAGNOSIS — E039 Hypothyroidism, unspecified: Secondary | ICD-10-CM | POA: Diagnosis not present

## 2016-07-23 DIAGNOSIS — Z0000 Encounter for general adult medical examination without abnormal findings: Secondary | ICD-10-CM | POA: Diagnosis not present

## 2016-07-23 DIAGNOSIS — Z6823 Body mass index (BMI) 23.0-23.9, adult: Secondary | ICD-10-CM | POA: Diagnosis not present

## 2016-07-23 DIAGNOSIS — I5042 Chronic combined systolic (congestive) and diastolic (congestive) heart failure: Secondary | ICD-10-CM | POA: Diagnosis not present

## 2016-08-14 ENCOUNTER — Other Ambulatory Visit: Payer: Self-pay | Admitting: Interventional Cardiology

## 2016-09-12 ENCOUNTER — Other Ambulatory Visit: Payer: Self-pay | Admitting: Interventional Cardiology

## 2016-10-01 DIAGNOSIS — E222 Syndrome of inappropriate secretion of antidiuretic hormone: Secondary | ICD-10-CM | POA: Diagnosis not present

## 2016-10-01 DIAGNOSIS — R5383 Other fatigue: Secondary | ICD-10-CM | POA: Diagnosis not present

## 2016-10-01 DIAGNOSIS — I509 Heart failure, unspecified: Secondary | ICD-10-CM | POA: Diagnosis not present

## 2016-10-01 DIAGNOSIS — L989 Disorder of the skin and subcutaneous tissue, unspecified: Secondary | ICD-10-CM | POA: Diagnosis not present

## 2016-10-01 DIAGNOSIS — I350 Nonrheumatic aortic (valve) stenosis: Secondary | ICD-10-CM | POA: Diagnosis not present

## 2016-10-01 DIAGNOSIS — N183 Chronic kidney disease, stage 3 (moderate): Secondary | ICD-10-CM | POA: Diagnosis not present

## 2016-10-01 DIAGNOSIS — I129 Hypertensive chronic kidney disease with stage 1 through stage 4 chronic kidney disease, or unspecified chronic kidney disease: Secondary | ICD-10-CM | POA: Diagnosis not present

## 2016-10-01 DIAGNOSIS — J300 Vasomotor rhinitis: Secondary | ICD-10-CM | POA: Diagnosis not present

## 2016-10-01 DIAGNOSIS — I700 Atherosclerosis of aorta: Secondary | ICD-10-CM | POA: Diagnosis not present

## 2016-10-02 DIAGNOSIS — D692 Other nonthrombocytopenic purpura: Secondary | ICD-10-CM | POA: Diagnosis not present

## 2016-10-02 DIAGNOSIS — D485 Neoplasm of uncertain behavior of skin: Secondary | ICD-10-CM | POA: Diagnosis not present

## 2016-10-02 DIAGNOSIS — D0472 Carcinoma in situ of skin of left lower limb, including hip: Secondary | ICD-10-CM | POA: Diagnosis not present

## 2016-10-08 ENCOUNTER — Other Ambulatory Visit: Payer: Self-pay | Admitting: Interventional Cardiology

## 2016-10-16 DIAGNOSIS — L570 Actinic keratosis: Secondary | ICD-10-CM | POA: Diagnosis not present

## 2016-10-16 DIAGNOSIS — C44729 Squamous cell carcinoma of skin of left lower limb, including hip: Secondary | ICD-10-CM | POA: Diagnosis not present

## 2016-11-06 ENCOUNTER — Other Ambulatory Visit: Payer: Self-pay | Admitting: Interventional Cardiology

## 2016-11-13 ENCOUNTER — Telehealth: Payer: Self-pay | Admitting: Interventional Cardiology

## 2016-11-13 MED ORDER — POTASSIUM CHLORIDE CRYS ER 20 MEQ PO TBCR: 20.0000 meq | EXTENDED_RELEASE_TABLET | Freq: Two times a day (BID) | ORAL | 1 refills | Status: DC

## 2016-11-13 NOTE — Telephone Encounter (Signed)
New message       *STAT* If patient is at the pharmacy, call can be transferred to refill team.   1. Which medications need to be refilled? (please list name of each medication and dose if known) potassium  2. Which pharmacy/location (including street and city if local pharmacy) is medication to be sent to? [facility] 3. Do they need a 30 day or 90 day supply?  Need enough to last until may 29th appt

## 2016-11-13 NOTE — Telephone Encounter (Signed)
Pt's medication was sent to pt's pharmacy as requested. Confirmation received.  °

## 2016-12-04 ENCOUNTER — Other Ambulatory Visit: Payer: Self-pay | Admitting: Interventional Cardiology

## 2016-12-22 NOTE — Progress Notes (Signed)
Cardiology Office Note   Date:  12/24/2016   ID:  Tina Weaver, DOB Aug 15, 1918, MRN [id number]  PCP:  Lajean Manes, MD    Chief Complaint  Patient presents with  . moderate aortic stenosis    follow up     Wt Readings from Last 3 Encounters:  12/24/16 117 lb 9.6 oz (53.3 kg)  09/11/15 121 lb 3.2 oz (55 kg)  05/21/15 126 lb (57.2 kg)       History of Present Illness: Tina Weaver is a 83 y.o. female  With a h/o moderate AS, LVH, and chronic diastolic heart failure.    She has had difficult to manage high BP at times.  She has had swelling and SHOB in the past, but both improved with diuresis.  Over the past year, she has felt well. She denies any lightheadedness, chest discomfort, heart failure symptoms or chest pain.  She has not had any lightheadedness. She continues to go up and down stairs at home. She is careful to try to avoid falling.  Denies : Chest pain. Dizziness. Leg edema.  Orthopnea. Palpitations. Paroxysmal nocturnal dyspnea. Shortness of breath. Syncope.      Past Medical History:  Diagnosis Date  . CHF (congestive heart failure) ([place])   . DOE (dyspnea on exertion) 09/11/2015  . Hypertension     Past Surgical History:  Procedure Laterality Date  . ABDOMINAL HYSTERECTOMY       Current Outpatient Prescriptions  Medication Sig Dispense Refill  . aspirin EC 81 MG tablet Take 81 mg by mouth daily.    . carvedilol (COREG) 12.5 MG tablet Take 1.5 tablets by mouth 2 (two) times daily.    . furosemide (LASIX) 20 MG tablet Take 1 tablet (20 mg total) by mouth daily. Please keep 12/24/16 appt for further authorization 45 tablet 0  . ipratropium (ATROVENT) 0.03 % nasal spray Place 1 spray into both nostrils every other day.    . levothyroxine (SYNTHROID, LEVOTHROID) 88 MCG tablet Take 88 mcg by mouth daily.    . potassium chloride SA (K-DUR,KLOR-CON) 20 MEQ tablet Take 1 tablet (20 mEq total) by mouth 2 (two) times daily. 60 tablet 1  . rosuvastatin  (CRESTOR) 5 MG tablet Take 5 mg by mouth daily.     No current facility-administered medications for this visit.     Allergies:   Norvasc [amlodipine] and Fosamax [alendronate sodium]    Social History:  The patient  reports that she has never smoked. She has never used smokeless tobacco. She reports that she does not drink alcohol.   Family History:  The patient's family history includes Cancer (age of onset: 35) in her mother; Cirrhosis in her sister; Heart attack in her brother; Heart attack (age of onset: 19) in her father; Hyperlipidemia in her son and son; Hypertension in her sister; Other in her sister and sister; Stroke in her father.    ROS:  Please see the history of present illness.   Otherwise, review of systems are positive for occasional fatigue/dyspnea with exertion.   All other systems are reviewed and negative.    PHYSICAL EXAM: VS:  BP (!) 166/60   Pulse (!) 55   Ht 4\' 11"  (1.499 m)   Wt 117 lb 9.6 oz (53.3 kg)   BMI 23.75 kg/m  , BMI Body mass index is 23.75 kg/m. GEN: Frail, in no acute distress  HEENT: normal  Neck: no JVD, carotid bruits, or masses Cardiac: RRR; 3/6 systolic murmur,;  no rubs, or gallops,no edema  Respiratory:  clear to auscultation bilaterally, normal work of breathing GI: soft, nontender, nondistended, + BS MS: no deformity or atrophy  Skin: warm and dry, no rash Neuro:  Strength and sensation are intact Psych: euthymic mood, full affect   EKG:   The ekg ordered today demonstrates sinus bradycardia, NSST   Recent Labs: 05/02/2016: BUN 18; Creat 1.00; Potassium 4.8; Sodium 138   Lipid Panel No results found for: CHOL, TRIG, HDL, CHOLHDL, VLDL, LDLCALC, LDLDIRECT   Other studies Reviewed: Additional studies/ records that were reviewed today with results demonstrating: 2016 echo shows moderate AS, normal LV function.   ASSESSMENT AND PLAN:  1. Chronic diastolic heart failure / hypertensive heart disease: She appears euvolemic.  Continue current medications.  Watch for leg edema or SHOB when lying down.  2. Aortic stenosis: No signs of severe aortic stenosis. We discussed the symptoms to look for including congestive heart failure symptoms, chest pain, lightheadedness and syncope. 3. DOE: Deconditioning related to age. I stressed the importance of avoiding falls.   Current medicines are reviewed at length with the patient today.  The patient concerns regarding her medicines were addressed.  The following changes have been made:  No change  Labs/ tests ordered today include:  No orders of the defined types were placed in this encounter.   Recommend 150 minutes/week of aerobic exercise Low fat, low carb, high fiber diet recommended  Disposition:   FU in 1 year   Signed, Larae Grooms, MD  12/24/2016 9:47 AM    [facility] [address], [place], [place]  [address] Phone: [phone]; Fax: [phone]

## 2016-12-23 ENCOUNTER — Ambulatory Visit: Payer: Medicare HMO | Admitting: Interventional Cardiology

## 2016-12-24 ENCOUNTER — Encounter: Payer: Self-pay | Admitting: Interventional Cardiology

## 2016-12-24 ENCOUNTER — Ambulatory Visit (INDEPENDENT_AMBULATORY_CARE_PROVIDER_SITE_OTHER): Payer: Medicare HMO | Admitting: Interventional Cardiology

## 2016-12-24 VITALS — BP 166/60 | HR 55 | Ht 59.0 in | Wt 117.6 lb

## 2016-12-24 DIAGNOSIS — I110 Hypertensive heart disease with heart failure: Secondary | ICD-10-CM | POA: Diagnosis not present

## 2016-12-24 DIAGNOSIS — I350 Nonrheumatic aortic (valve) stenosis: Secondary | ICD-10-CM

## 2016-12-24 DIAGNOSIS — R0600 Dyspnea, unspecified: Secondary | ICD-10-CM

## 2016-12-24 DIAGNOSIS — I5032 Chronic diastolic (congestive) heart failure: Secondary | ICD-10-CM

## 2016-12-24 DIAGNOSIS — R0609 Other forms of dyspnea: Secondary | ICD-10-CM

## 2016-12-24 DIAGNOSIS — I119 Hypertensive heart disease without heart failure: Secondary | ICD-10-CM | POA: Insufficient documentation

## 2016-12-24 MED ORDER — ASPIRIN EC 81 MG PO TBEC: 81.0000 mg | DELAYED_RELEASE_TABLET | Freq: Every day | ORAL | 3 refills | Status: DC

## 2016-12-24 MED ORDER — ROSUVASTATIN CALCIUM 5 MG PO TABS: 5.0000 mg | ORAL_TABLET | Freq: Every day | ORAL | 3 refills | Status: DC

## 2016-12-24 MED ORDER — FUROSEMIDE 20 MG PO TABS: 20.0000 mg | ORAL_TABLET | Freq: Every day | ORAL | 0 refills | Status: DC

## 2016-12-24 MED ORDER — CARVEDILOL 12.5 MG PO TABS: 18.7500 mg | ORAL_TABLET | Freq: Two times a day (BID) | ORAL | 3 refills | Status: DC

## 2016-12-24 MED ORDER — POTASSIUM CHLORIDE CRYS ER 20 MEQ PO TBCR: 20.0000 meq | EXTENDED_RELEASE_TABLET | Freq: Two times a day (BID) | ORAL | 1 refills | Status: DC

## 2016-12-24 NOTE — Patient Instructions (Signed)

## 2017-01-14 DIAGNOSIS — I5042 Chronic combined systolic (congestive) and diastolic (congestive) heart failure: Secondary | ICD-10-CM | POA: Diagnosis not present

## 2017-01-14 DIAGNOSIS — Z0000 Encounter for general adult medical examination without abnormal findings: Secondary | ICD-10-CM | POA: Diagnosis not present

## 2017-01-14 DIAGNOSIS — E785 Hyperlipidemia, unspecified: Secondary | ICD-10-CM | POA: Diagnosis not present

## 2017-01-14 DIAGNOSIS — I700 Atherosclerosis of aorta: Secondary | ICD-10-CM | POA: Diagnosis not present

## 2017-01-14 DIAGNOSIS — R011 Cardiac murmur, unspecified: Secondary | ICD-10-CM | POA: Diagnosis not present

## 2017-01-14 DIAGNOSIS — Z973 Presence of spectacles and contact lenses: Secondary | ICD-10-CM | POA: Diagnosis not present

## 2017-01-14 DIAGNOSIS — Z9849 Cataract extraction status, unspecified eye: Secondary | ICD-10-CM | POA: Diagnosis not present

## 2017-01-14 DIAGNOSIS — Z6825 Body mass index (BMI) 25.0-25.9, adult: Secondary | ICD-10-CM | POA: Diagnosis not present

## 2017-01-14 DIAGNOSIS — Z9181 History of falling: Secondary | ICD-10-CM | POA: Diagnosis not present

## 2017-01-14 DIAGNOSIS — E039 Hypothyroidism, unspecified: Secondary | ICD-10-CM | POA: Diagnosis not present

## 2017-01-14 DIAGNOSIS — Z972 Presence of dental prosthetic device (complete) (partial): Secondary | ICD-10-CM | POA: Diagnosis not present

## 2017-01-14 DIAGNOSIS — K08409 Partial loss of teeth, unspecified cause, unspecified class: Secondary | ICD-10-CM | POA: Diagnosis not present

## 2017-01-14 DIAGNOSIS — M65332 Trigger finger, left middle finger: Secondary | ICD-10-CM | POA: Diagnosis not present

## 2017-01-14 DIAGNOSIS — R251 Tremor, unspecified: Secondary | ICD-10-CM | POA: Diagnosis not present

## 2017-02-19 DIAGNOSIS — L821 Other seborrheic keratosis: Secondary | ICD-10-CM | POA: Diagnosis not present

## 2017-02-19 DIAGNOSIS — Z08 Encounter for follow-up examination after completed treatment for malignant neoplasm: Secondary | ICD-10-CM | POA: Diagnosis not present

## 2017-02-19 DIAGNOSIS — Z85828 Personal history of other malignant neoplasm of skin: Secondary | ICD-10-CM | POA: Diagnosis not present

## 2017-04-13 ENCOUNTER — Other Ambulatory Visit: Payer: Self-pay | Admitting: Interventional Cardiology

## 2017-04-15 DIAGNOSIS — H5203 Hypermetropia, bilateral: Secondary | ICD-10-CM | POA: Diagnosis not present

## 2017-04-15 DIAGNOSIS — H52203 Unspecified astigmatism, bilateral: Secondary | ICD-10-CM | POA: Diagnosis not present

## 2017-04-15 DIAGNOSIS — H35363 Drusen (degenerative) of macula, bilateral: Secondary | ICD-10-CM | POA: Diagnosis not present

## 2017-04-15 DIAGNOSIS — H26492 Other secondary cataract, left eye: Secondary | ICD-10-CM | POA: Diagnosis not present

## 2017-04-15 DIAGNOSIS — H524 Presbyopia: Secondary | ICD-10-CM | POA: Diagnosis not present

## 2017-04-15 DIAGNOSIS — H31001 Unspecified chorioretinal scars, right eye: Secondary | ICD-10-CM | POA: Diagnosis not present

## 2017-04-15 DIAGNOSIS — Z961 Presence of intraocular lens: Secondary | ICD-10-CM | POA: Diagnosis not present

## 2017-04-28 DIAGNOSIS — N183 Chronic kidney disease, stage 3 (moderate): Secondary | ICD-10-CM | POA: Diagnosis not present

## 2017-04-28 DIAGNOSIS — I350 Nonrheumatic aortic (valve) stenosis: Secondary | ICD-10-CM | POA: Diagnosis not present

## 2017-04-28 DIAGNOSIS — D539 Nutritional anemia, unspecified: Secondary | ICD-10-CM | POA: Diagnosis not present

## 2017-04-28 DIAGNOSIS — I129 Hypertensive chronic kidney disease with stage 1 through stage 4 chronic kidney disease, or unspecified chronic kidney disease: Secondary | ICD-10-CM | POA: Diagnosis not present

## 2017-04-28 DIAGNOSIS — Z79899 Other long term (current) drug therapy: Secondary | ICD-10-CM | POA: Diagnosis not present

## 2017-04-28 DIAGNOSIS — Z0000 Encounter for general adult medical examination without abnormal findings: Secondary | ICD-10-CM | POA: Diagnosis not present

## 2017-04-28 DIAGNOSIS — E222 Syndrome of inappropriate secretion of antidiuretic hormone: Secondary | ICD-10-CM | POA: Diagnosis not present

## 2017-04-28 DIAGNOSIS — I509 Heart failure, unspecified: Secondary | ICD-10-CM | POA: Diagnosis not present

## 2017-04-28 DIAGNOSIS — E7800 Pure hypercholesterolemia, unspecified: Secondary | ICD-10-CM | POA: Diagnosis not present

## 2017-04-28 DIAGNOSIS — Z23 Encounter for immunization: Secondary | ICD-10-CM | POA: Diagnosis not present

## 2017-04-28 DIAGNOSIS — Z1389 Encounter for screening for other disorder: Secondary | ICD-10-CM | POA: Diagnosis not present

## 2017-04-28 DIAGNOSIS — E039 Hypothyroidism, unspecified: Secondary | ICD-10-CM | POA: Diagnosis not present

## 2017-04-28 DIAGNOSIS — I700 Atherosclerosis of aorta: Secondary | ICD-10-CM | POA: Diagnosis not present

## 2017-08-12 ENCOUNTER — Other Ambulatory Visit: Payer: Self-pay | Admitting: Interventional Cardiology

## 2017-08-27 DIAGNOSIS — Z85828 Personal history of other malignant neoplasm of skin: Secondary | ICD-10-CM | POA: Diagnosis not present

## 2017-08-27 DIAGNOSIS — Z08 Encounter for follow-up examination after completed treatment for malignant neoplasm: Secondary | ICD-10-CM | POA: Diagnosis not present

## 2017-08-27 DIAGNOSIS — L821 Other seborrheic keratosis: Secondary | ICD-10-CM | POA: Diagnosis not present

## 2017-09-01 DIAGNOSIS — E785 Hyperlipidemia, unspecified: Secondary | ICD-10-CM | POA: Diagnosis not present

## 2017-09-01 DIAGNOSIS — R030 Elevated blood-pressure reading, without diagnosis of hypertension: Secondary | ICD-10-CM | POA: Diagnosis not present

## 2017-09-01 DIAGNOSIS — E039 Hypothyroidism, unspecified: Secondary | ICD-10-CM | POA: Diagnosis not present

## 2017-09-01 DIAGNOSIS — I509 Heart failure, unspecified: Secondary | ICD-10-CM | POA: Diagnosis not present

## 2017-09-01 DIAGNOSIS — K08409 Partial loss of teeth, unspecified cause, unspecified class: Secondary | ICD-10-CM | POA: Diagnosis not present

## 2017-09-01 DIAGNOSIS — H547 Unspecified visual loss: Secondary | ICD-10-CM | POA: Diagnosis not present

## 2017-09-01 DIAGNOSIS — R32 Unspecified urinary incontinence: Secondary | ICD-10-CM | POA: Diagnosis not present

## 2017-09-01 DIAGNOSIS — M810 Age-related osteoporosis without current pathological fracture: Secondary | ICD-10-CM | POA: Diagnosis not present

## 2017-09-01 DIAGNOSIS — J309 Allergic rhinitis, unspecified: Secondary | ICD-10-CM | POA: Diagnosis not present

## 2017-09-01 DIAGNOSIS — K5900 Constipation, unspecified: Secondary | ICD-10-CM | POA: Diagnosis not present

## 2017-10-28 DIAGNOSIS — I129 Hypertensive chronic kidney disease with stage 1 through stage 4 chronic kidney disease, or unspecified chronic kidney disease: Secondary | ICD-10-CM | POA: Diagnosis not present

## 2017-10-28 DIAGNOSIS — I5032 Chronic diastolic (congestive) heart failure: Secondary | ICD-10-CM | POA: Diagnosis not present

## 2017-10-28 DIAGNOSIS — E222 Syndrome of inappropriate secretion of antidiuretic hormone: Secondary | ICD-10-CM | POA: Diagnosis not present

## 2017-10-28 DIAGNOSIS — Z79899 Other long term (current) drug therapy: Secondary | ICD-10-CM | POA: Diagnosis not present

## 2017-10-28 DIAGNOSIS — N183 Chronic kidney disease, stage 3 (moderate): Secondary | ICD-10-CM | POA: Diagnosis not present

## 2017-10-28 DIAGNOSIS — G250 Essential tremor: Secondary | ICD-10-CM | POA: Diagnosis not present

## 2017-11-09 ENCOUNTER — Other Ambulatory Visit: Payer: Self-pay | Admitting: Interventional Cardiology

## 2017-11-10 ENCOUNTER — Other Ambulatory Visit: Payer: Self-pay | Admitting: Interventional Cardiology

## 2017-11-10 NOTE — Telephone Encounter (Signed)
Defer to PCP

## 2017-11-11 NOTE — Telephone Encounter (Signed)
Agree, defer to PCP

## 2017-12-12 ENCOUNTER — Other Ambulatory Visit: Payer: Self-pay | Admitting: Interventional Cardiology

## 2018-01-05 ENCOUNTER — Ambulatory Visit: Payer: Medicare HMO | Admitting: Interventional Cardiology

## 2018-01-05 ENCOUNTER — Encounter (INDEPENDENT_AMBULATORY_CARE_PROVIDER_SITE_OTHER): Payer: Self-pay

## 2018-01-05 ENCOUNTER — Encounter: Payer: Self-pay | Admitting: Interventional Cardiology

## 2018-01-05 VITALS — BP 154/78 | HR 56 | Ht 59.0 in | Wt 113.0 lb

## 2018-01-05 DIAGNOSIS — I5032 Chronic diastolic (congestive) heart failure: Secondary | ICD-10-CM

## 2018-01-05 DIAGNOSIS — I110 Hypertensive heart disease with heart failure: Secondary | ICD-10-CM | POA: Diagnosis not present

## 2018-01-05 DIAGNOSIS — R0609 Other forms of dyspnea: Secondary | ICD-10-CM | POA: Diagnosis not present

## 2018-01-05 DIAGNOSIS — I350 Nonrheumatic aortic (valve) stenosis: Secondary | ICD-10-CM | POA: Diagnosis not present

## 2018-01-05 DIAGNOSIS — R0600 Dyspnea, unspecified: Secondary | ICD-10-CM

## 2018-01-05 NOTE — Progress Notes (Signed)
Cardiology Office Note   Date:  01/05/2018   ID:  CHYLER CREELY, DOB 1919-03-09, MRN [id number]  PCP:  Lajean Manes, MD    No chief complaint on file.  Chronic diastolic heart failure  Wt Readings from Last 3 Encounters:  01/05/18 113 lb (51.3 kg)  12/24/16 117 lb 9.6 oz (53.3 kg)  09/11/15 121 lb 3.2 oz (55 kg)       History of Present Illness: Tina Weaver is a 84 y.o. female  With a h/o moderate AS, LVH, and chronic diastolic heart failure.    She has had difficult to manage high BP at times.  She has had swelling and SHOB in the past, but both improved with diuresis.  [facility]. Echocardiogram showed hypertrophic cardiomyopathy. She had medicine adjustment. She was diuresed nearly 10 pounds.  In the past, she would go up and down stairs at home. She was careful to try to avoid falling.  She continues to avoid falling.  Denies : Chest pain. Dizziness. Leg edema. Nitroglycerin use. Orthopnea. Palpitations. Paroxysmal nocturnal dyspnea. Syncope.   Mild DOE. Going up stairs is her most strenuous activity.  Walking to the mailbox also is strenuous.  Still living at home.     Past Medical History:  Diagnosis Date  . CHF (congestive heart failure) ([place])   . DOE (dyspnea on exertion) 09/11/2015  . Hypertension     Past Surgical History:  Procedure Laterality Date  . ABDOMINAL HYSTERECTOMY       Current Outpatient Medications  Medication Sig Dispense Refill  . aspirin EC 81 MG tablet Take 1 tablet (81 mg total) by mouth daily. 90 tablet 3  . carvedilol (COREG) 12.5 MG tablet TAKE 1 AND 1/2 TABLETS BY MOUTH TWICE DAILY 270 tablet 0  . furosemide (LASIX) 20 MG tablet TAKE 1 TABLET BY MOUTH EVERY DAY 90 tablet 0  . ipratropium (ATROVENT) 0.03 % nasal spray Place 1 spray into both nostrils every other day.    . levothyroxine (SYNTHROID, LEVOTHROID) 88 MCG tablet Take 88 mcg by mouth daily.    . potassium chloride SA (K-DUR,KLOR-CON) 20 MEQ  tablet TAKE ONE TABLET TWICE DAILY 60 tablet 4  . rosuvastatin (CRESTOR) 5 MG tablet TAKE 1 TABLET BY MOUTH EVERY DAY 30 tablet 0   No current facility-administered medications for this visit.     Allergies:   Norvasc [amlodipine]; Alendronate; and Fosamax [alendronate sodium]    Social History:  The patient  reports that she has never smoked. She has never used smokeless tobacco. She reports that she does not drink alcohol.   Family History:  The patient's family history includes Cancer (age of onset: 63) in her mother; Cirrhosis in her sister; Heart attack in her brother; Heart attack (age of onset: 69) in her father; Hyperlipidemia in her son and son; Hypertension in her sister; Other in her sister and sister; Stroke in her father.    ROS:  Please see the history of present illness.   Otherwise, review of systems are positive for DOE.   All other systems are reviewed and negative.    PHYSICAL EXAM: VS:  BP (!) 154/78   Pulse (!) 56   Ht 4\' 11"  (1.499 m)   Wt 113 lb (51.3 kg)   SpO2 97%   BMI 22.82 kg/m  , BMI Body mass index is 22.82 kg/m. GEN: Well nourished, well developed, in no acute distress  HEENT: normal  Neck: no  JVD, carotid bruits, or masses Cardiac: RRR; no murmurs, rubs, or gallops,no edema  Respiratory:  clear to auscultation bilaterally, normal work of breathing GI: soft, nontender, nondistended, + BS MS: no deformity or atrophy  Skin: warm and dry, no rash Neuro:  Strength and sensation are intact Psych: euthymic mood, full affect   EKG:   The ekg ordered today demonstrates NSR, non specific ST- T wave flattening   Recent Labs: No results found for requested labs within last 8760 hours.   Lipid Panel No results found for: CHOL, TRIG, HDL, CHOLHDL, VLDL, LDLCALC, LDLDIRECT   Other studies Reviewed: Additional studies/ records that were reviewed today with results demonstrating: 2016 echo reviewed.   ASSESSMENT AND PLAN:  1. Chronic diastolic  heart failure/hypertensive heart disease:  Appears euvolemic.  BP controlled.  Home readings reviewed and in the 638-937D range systolic.  Lowest was 428 systolic. 2. Aortic stenosis: moderate by 2016 echo. No sx of severe AS.  She will watch for angina or syncope or volume overload.  3. DOE: Stable.  No change since last year.  We discussed repeat echo but they are ok to hold off until sx get worse.     Current medicines are reviewed at length with the patient today.  The patient concerns regarding her medicines were addressed.  The following changes have been made:  No change  Labs/ tests ordered today include:  No orders of the defined types were placed in this encounter.   Recommend 150 minutes/week of aerobic exercise Low fat, low carb, high fiber diet recommended  Disposition:   FU in 1 year   Signed, Larae Grooms, MD  01/05/2018 1:47 PM    [facility] [address], [place], [place]  [address] Phone: [phone]; Fax: [phone]

## 2018-01-05 NOTE — Patient Instructions (Signed)

## 2018-02-05 ENCOUNTER — Other Ambulatory Visit: Payer: Self-pay | Admitting: Interventional Cardiology

## 2018-04-12 ENCOUNTER — Other Ambulatory Visit: Payer: Self-pay | Admitting: Interventional Cardiology

## 2018-04-28 DIAGNOSIS — H35363 Drusen (degenerative) of macula, bilateral: Secondary | ICD-10-CM | POA: Diagnosis not present

## 2018-04-28 DIAGNOSIS — H353121 Nonexudative age-related macular degeneration, left eye, early dry stage: Secondary | ICD-10-CM | POA: Diagnosis not present

## 2018-04-28 DIAGNOSIS — H26492 Other secondary cataract, left eye: Secondary | ICD-10-CM | POA: Diagnosis not present

## 2018-04-28 DIAGNOSIS — Z961 Presence of intraocular lens: Secondary | ICD-10-CM | POA: Diagnosis not present

## 2018-04-28 DIAGNOSIS — H524 Presbyopia: Secondary | ICD-10-CM | POA: Diagnosis not present

## 2018-04-28 DIAGNOSIS — H353211 Exudative age-related macular degeneration, right eye, with active choroidal neovascularization: Secondary | ICD-10-CM | POA: Diagnosis not present

## 2018-04-28 DIAGNOSIS — H31001 Unspecified chorioretinal scars, right eye: Secondary | ICD-10-CM | POA: Diagnosis not present

## 2018-04-28 DIAGNOSIS — H52203 Unspecified astigmatism, bilateral: Secondary | ICD-10-CM | POA: Diagnosis not present

## 2018-04-28 DIAGNOSIS — H5203 Hypermetropia, bilateral: Secondary | ICD-10-CM | POA: Diagnosis not present

## 2018-05-05 DIAGNOSIS — Z961 Presence of intraocular lens: Secondary | ICD-10-CM | POA: Diagnosis not present

## 2018-05-05 DIAGNOSIS — N183 Chronic kidney disease, stage 3 (moderate): Secondary | ICD-10-CM | POA: Diagnosis not present

## 2018-05-05 DIAGNOSIS — I129 Hypertensive chronic kidney disease with stage 1 through stage 4 chronic kidney disease, or unspecified chronic kidney disease: Secondary | ICD-10-CM | POA: Diagnosis not present

## 2018-05-05 DIAGNOSIS — I10 Essential (primary) hypertension: Secondary | ICD-10-CM | POA: Diagnosis not present

## 2018-05-05 DIAGNOSIS — H353211 Exudative age-related macular degeneration, right eye, with active choroidal neovascularization: Secondary | ICD-10-CM | POA: Diagnosis not present

## 2018-05-05 DIAGNOSIS — H538 Other visual disturbances: Secondary | ICD-10-CM | POA: Diagnosis not present

## 2018-05-05 DIAGNOSIS — H35363 Drusen (degenerative) of macula, bilateral: Secondary | ICD-10-CM | POA: Diagnosis not present

## 2018-05-05 DIAGNOSIS — H31001 Unspecified chorioretinal scars, right eye: Secondary | ICD-10-CM | POA: Diagnosis not present

## 2018-05-05 DIAGNOSIS — H353121 Nonexudative age-related macular degeneration, left eye, early dry stage: Secondary | ICD-10-CM | POA: Diagnosis not present

## 2018-05-05 DIAGNOSIS — Z23 Encounter for immunization: Secondary | ICD-10-CM | POA: Diagnosis not present

## 2018-05-05 DIAGNOSIS — H26492 Other secondary cataract, left eye: Secondary | ICD-10-CM | POA: Diagnosis not present

## 2018-05-17 DIAGNOSIS — R1011 Right upper quadrant pain: Secondary | ICD-10-CM | POA: Diagnosis not present

## 2018-05-17 DIAGNOSIS — I129 Hypertensive chronic kidney disease with stage 1 through stage 4 chronic kidney disease, or unspecified chronic kidney disease: Secondary | ICD-10-CM | POA: Diagnosis not present

## 2018-05-17 DIAGNOSIS — R202 Paresthesia of skin: Secondary | ICD-10-CM | POA: Diagnosis not present

## 2018-05-17 DIAGNOSIS — N183 Chronic kidney disease, stage 3 (moderate): Secondary | ICD-10-CM | POA: Diagnosis not present

## 2018-05-19 DIAGNOSIS — H35362 Drusen (degenerative) of macula, left eye: Secondary | ICD-10-CM | POA: Diagnosis not present

## 2018-05-19 DIAGNOSIS — Z961 Presence of intraocular lens: Secondary | ICD-10-CM | POA: Diagnosis not present

## 2018-05-19 DIAGNOSIS — H353211 Exudative age-related macular degeneration, right eye, with active choroidal neovascularization: Secondary | ICD-10-CM | POA: Diagnosis not present

## 2018-05-19 DIAGNOSIS — H26492 Other secondary cataract, left eye: Secondary | ICD-10-CM | POA: Diagnosis not present

## 2018-05-19 DIAGNOSIS — H31001 Unspecified chorioretinal scars, right eye: Secondary | ICD-10-CM | POA: Diagnosis not present

## 2018-06-09 DIAGNOSIS — I5032 Chronic diastolic (congestive) heart failure: Secondary | ICD-10-CM | POA: Diagnosis not present

## 2018-06-09 DIAGNOSIS — I129 Hypertensive chronic kidney disease with stage 1 through stage 4 chronic kidney disease, or unspecified chronic kidney disease: Secondary | ICD-10-CM | POA: Diagnosis not present

## 2018-06-09 DIAGNOSIS — I700 Atherosclerosis of aorta: Secondary | ICD-10-CM | POA: Diagnosis not present

## 2018-06-09 DIAGNOSIS — I350 Nonrheumatic aortic (valve) stenosis: Secondary | ICD-10-CM | POA: Diagnosis not present

## 2018-06-09 DIAGNOSIS — Z0000 Encounter for general adult medical examination without abnormal findings: Secondary | ICD-10-CM | POA: Diagnosis not present

## 2018-06-09 DIAGNOSIS — E7800 Pure hypercholesterolemia, unspecified: Secondary | ICD-10-CM | POA: Diagnosis not present

## 2018-06-09 DIAGNOSIS — N183 Chronic kidney disease, stage 3 (moderate): Secondary | ICD-10-CM | POA: Diagnosis not present

## 2018-06-09 DIAGNOSIS — E222 Syndrome of inappropriate secretion of antidiuretic hormone: Secondary | ICD-10-CM | POA: Diagnosis not present

## 2018-06-09 DIAGNOSIS — G250 Essential tremor: Secondary | ICD-10-CM | POA: Diagnosis not present

## 2018-06-09 DIAGNOSIS — E039 Hypothyroidism, unspecified: Secondary | ICD-10-CM | POA: Diagnosis not present

## 2018-06-23 DIAGNOSIS — Z961 Presence of intraocular lens: Secondary | ICD-10-CM | POA: Diagnosis not present

## 2018-06-23 DIAGNOSIS — H353211 Exudative age-related macular degeneration, right eye, with active choroidal neovascularization: Secondary | ICD-10-CM | POA: Diagnosis not present

## 2018-06-23 DIAGNOSIS — H31001 Unspecified chorioretinal scars, right eye: Secondary | ICD-10-CM | POA: Diagnosis not present

## 2018-06-23 DIAGNOSIS — H26492 Other secondary cataract, left eye: Secondary | ICD-10-CM | POA: Diagnosis not present

## 2018-06-23 DIAGNOSIS — H353121 Nonexudative age-related macular degeneration, left eye, early dry stage: Secondary | ICD-10-CM | POA: Diagnosis not present

## 2018-06-23 DIAGNOSIS — H3554 Dystrophies primarily involving the retinal pigment epithelium: Secondary | ICD-10-CM | POA: Diagnosis not present

## 2018-08-02 DIAGNOSIS — Z833 Family history of diabetes mellitus: Secondary | ICD-10-CM | POA: Diagnosis not present

## 2018-08-02 DIAGNOSIS — M810 Age-related osteoporosis without current pathological fracture: Secondary | ICD-10-CM | POA: Diagnosis not present

## 2018-08-02 DIAGNOSIS — Z823 Family history of stroke: Secondary | ICD-10-CM | POA: Diagnosis not present

## 2018-08-02 DIAGNOSIS — I110 Hypertensive heart disease with heart failure: Secondary | ICD-10-CM | POA: Diagnosis not present

## 2018-08-02 DIAGNOSIS — Z8249 Family history of ischemic heart disease and other diseases of the circulatory system: Secondary | ICD-10-CM | POA: Diagnosis not present

## 2018-08-02 DIAGNOSIS — J309 Allergic rhinitis, unspecified: Secondary | ICD-10-CM | POA: Diagnosis not present

## 2018-08-02 DIAGNOSIS — E785 Hyperlipidemia, unspecified: Secondary | ICD-10-CM | POA: Diagnosis not present

## 2018-08-02 DIAGNOSIS — I509 Heart failure, unspecified: Secondary | ICD-10-CM | POA: Diagnosis not present

## 2018-08-02 DIAGNOSIS — Z809 Family history of malignant neoplasm, unspecified: Secondary | ICD-10-CM | POA: Diagnosis not present

## 2018-08-02 DIAGNOSIS — E039 Hypothyroidism, unspecified: Secondary | ICD-10-CM | POA: Diagnosis not present

## 2018-08-18 DIAGNOSIS — H3554 Dystrophies primarily involving the retinal pigment epithelium: Secondary | ICD-10-CM | POA: Diagnosis not present

## 2018-08-18 DIAGNOSIS — H35361 Drusen (degenerative) of macula, right eye: Secondary | ICD-10-CM | POA: Diagnosis not present

## 2018-08-18 DIAGNOSIS — H26492 Other secondary cataract, left eye: Secondary | ICD-10-CM | POA: Diagnosis not present

## 2018-08-18 DIAGNOSIS — H31001 Unspecified chorioretinal scars, right eye: Secondary | ICD-10-CM | POA: Diagnosis not present

## 2018-08-18 DIAGNOSIS — H353121 Nonexudative age-related macular degeneration, left eye, early dry stage: Secondary | ICD-10-CM | POA: Diagnosis not present

## 2018-08-18 DIAGNOSIS — Z961 Presence of intraocular lens: Secondary | ICD-10-CM | POA: Diagnosis not present

## 2018-08-18 DIAGNOSIS — H353211 Exudative age-related macular degeneration, right eye, with active choroidal neovascularization: Secondary | ICD-10-CM | POA: Diagnosis not present

## 2018-10-01 ENCOUNTER — Other Ambulatory Visit: Payer: Self-pay | Admitting: Interventional Cardiology

## 2018-12-14 DIAGNOSIS — I5032 Chronic diastolic (congestive) heart failure: Secondary | ICD-10-CM | POA: Diagnosis not present

## 2018-12-14 DIAGNOSIS — N183 Chronic kidney disease, stage 3 (moderate): Secondary | ICD-10-CM | POA: Diagnosis not present

## 2018-12-14 DIAGNOSIS — E222 Syndrome of inappropriate secretion of antidiuretic hormone: Secondary | ICD-10-CM | POA: Diagnosis not present

## 2018-12-14 DIAGNOSIS — I700 Atherosclerosis of aorta: Secondary | ICD-10-CM | POA: Diagnosis not present

## 2018-12-14 DIAGNOSIS — I129 Hypertensive chronic kidney disease with stage 1 through stage 4 chronic kidney disease, or unspecified chronic kidney disease: Secondary | ICD-10-CM | POA: Diagnosis not present

## 2019-01-03 ENCOUNTER — Other Ambulatory Visit: Payer: Self-pay | Admitting: Interventional Cardiology

## 2019-01-04 ENCOUNTER — Telehealth: Payer: Self-pay

## 2019-01-04 DIAGNOSIS — I422 Other hypertrophic cardiomyopathy: Secondary | ICD-10-CM | POA: Insufficient documentation

## 2019-01-04 NOTE — Telephone Encounter (Signed)
Virtual Visit Pre-Appointment Phone Call  TELEPHONE CALL NOTE  Tina Weaver has been deemed a candidate for a follow-up tele-health visit to limit community exposure during the Covid-19 pandemic. I spoke with the patient via phone to ensure availability of phone/video source, confirm preferred email & phone number, and discuss instructions and expectations.  I reminded Tina Weaver to be prepared with any vital sign and/or heart rhythm information that could potentially be obtained via home monitoring, at the time of her visit. I reminded Tina Weaver to expect a phone call prior to her visit.  Patient agrees to consent below.  Cleon Gustin, RN 01/04/2019 2:40 PM   FULL LENGTH CONSENT FOR TELE-HEALTH VISIT   I hereby voluntarily request, consent and authorize [facility] and its employed or contracted physicians, physician assistants, nurse practitioners or other licensed health care professionals (the Practitioner), to provide me with telemedicine health care services (the "Services") as deemed necessary by the treating Practitioner. I acknowledge and consent to receive the Services by the Practitioner via telemedicine. I understand that the telemedicine visit will involve communicating with the Practitioner through live audiovisual communication technology and the disclosure of certain medical information by electronic transmission. I acknowledge that I have been given the opportunity to request an in-person assessment or other available alternative prior to the telemedicine visit and am voluntarily participating in the telemedicine visit.  I understand that I have the right to withhold or withdraw my consent to the use of telemedicine in the course of my care at any time, without affecting my right to future care or treatment, and that the Practitioner or I may terminate the telemedicine visit at any time. I understand that I have the right to inspect all information obtained and/or  recorded in the course of the telemedicine visit and may receive copies of available information for a reasonable fee.  I understand that some of the potential risks of receiving the Services via telemedicine include:  Marland Kitchen Delay or interruption in medical evaluation due to technological equipment failure or disruption; . Information transmitted may not be sufficient (e.g. poor resolution of images) to allow for appropriate medical decision making by the Practitioner; and/or  . In rare instances, security protocols could fail, causing a breach of personal health information.  Furthermore, I acknowledge that it is my responsibility to provide information about my medical history, conditions and care that is complete and accurate to the best of my ability. I acknowledge that Practitioner's advice, recommendations, and/or decision may be based on factors not within their control, such as incomplete or inaccurate data provided by me or distortions of diagnostic images or specimens that may result from electronic transmissions. I understand that the practice of medicine is not an exact science and that Practitioner makes no warranties or guarantees regarding treatment outcomes. I acknowledge that I will receive a copy of this consent concurrently upon execution via email to the email address I last provided but may also request a printed copy by calling the office of [facility].    I understand that my insurance will be billed for this visit.   I have read or had this consent read to me. . I understand the contents of this consent, which adequately explains the benefits and risks of the Services being provided via telemedicine.  . I have been provided ample opportunity to ask questions regarding this consent and the Services and have had my questions answered to my satisfaction. Marland Kitchen I  give my informed consent for the services to be provided through the use of telemedicine in my medical care  By participating  in this telemedicine visit I agree to the above.

## 2019-01-05 ENCOUNTER — Other Ambulatory Visit: Payer: Self-pay

## 2019-01-05 ENCOUNTER — Telehealth (INDEPENDENT_AMBULATORY_CARE_PROVIDER_SITE_OTHER): Payer: Medicare HMO | Admitting: Physician Assistant

## 2019-01-05 ENCOUNTER — Encounter: Payer: Self-pay | Admitting: Physician Assistant

## 2019-01-05 VITALS — BP 176/62 | HR 58 | Ht 59.0 in | Wt 110.4 lb

## 2019-01-05 DIAGNOSIS — I5032 Chronic diastolic (congestive) heart failure: Secondary | ICD-10-CM

## 2019-01-05 DIAGNOSIS — I422 Other hypertrophic cardiomyopathy: Secondary | ICD-10-CM

## 2019-01-05 DIAGNOSIS — I110 Hypertensive heart disease with heart failure: Secondary | ICD-10-CM

## 2019-01-05 DIAGNOSIS — I350 Nonrheumatic aortic (valve) stenosis: Secondary | ICD-10-CM

## 2019-01-05 NOTE — Progress Notes (Signed)
Virtual Visit via Telephone Note   This visit type was conducted due to national recommendations for restrictions regarding the COVID-19 Pandemic (e.g. social distancing) in an effort to limit this patient's exposure and mitigate transmission in our community.  Due to her co-morbid illnesses, this patient is at least at moderate risk for complications without adequate follow up.  This format is felt to be most appropriate for this patient at this time.  The patient did not have access to video technology/had technical difficulties with video requiring transitioning to audio format only (telephone).  All issues noted in this document were discussed and addressed.  No physical exam could be performed with this format.  Please refer to the patient's chart for her  consent to telehealth for [facility].   Date:  01/05/2019   ID:  Tina Weaver, DOB [birth year], MRN [id number]  Patient Location: Home Provider Location: Home  PCP:  Lajean Manes, MD  Cardiologist:  Larae Grooms, MD   Electrophysiologist:  None   Evaluation Performed:  Follow-Up Visit  Chief Complaint:  Yearly f/u  History of Present Illness:    Tina Weaver is a 85 y.o. female with history of moderate AS, Hypertrophic cardiomyopathy,HTN with LVH, chronic diastolic chf.  Last saw Dr. Irish Lack 12/2017 and had chronic dyspnea on exertion that was unchanged. Discussed echo but patient wanted to hold off unless symptoms worsened.  Patient still lives at home by herself. Does leg exercises, walks up and down stairs several times a day, cooks, cleans and gardens tomato plants. When she walks back from there mailbox on an incline she gets out of breath and has to rest. Hasn't changed in several years. BP high today but she just took her medicine 15 min ago. BP usually runs 98/41, 103/47, 122/50, 149/37, 127/52, 136/50.   The patient does not have symptoms concerning for COVID-19 infection (fever, chills, cough, or new shortness  of breath).    Past Medical History:  Diagnosis Date  . CHF (congestive heart failure) ([place])   . DOE (dyspnea on exertion) 09/11/2015  . Hypertension    Past Surgical History:  Procedure Laterality Date  . ABDOMINAL HYSTERECTOMY       Current Meds  Medication Sig  . amLODipine (NORVASC) 5 MG tablet   . carvedilol (COREG) 12.5 MG tablet TAKE 1 AND 1/2 TABLETS BY MOUTH TWICE DAILY  . furosemide (LASIX) 20 MG tablet TAKE 1 TABLET BY MOUTH EVERY DAY  . ipratropium (ATROVENT) 0.03 % nasal spray Place 1 spray into both nostrils every other day.  . levothyroxine (SYNTHROID, LEVOTHROID) 88 MCG tablet Take 88 mcg by mouth daily.  . potassium chloride SA (K-DUR,KLOR-CON) 20 MEQ tablet TAKE 1 TABLET BY MOUTH 2 TIMES A DAY  . rosuvastatin (CRESTOR) 5 MG tablet Take 1 tablet (5 mg total) by mouth daily at 6 PM. Please keep upcoming appt for future refills. Thank you     Allergies:   Norvasc [amlodipine]; Alendronate; and Fosamax [alendronate sodium]   Social History   Tobacco Use  . Smoking status: Never Smoker  . Smokeless tobacco: Never Used  Substance Use Topics  . Alcohol use: No  . Drug use: Not on file     Family Hx: The patient's family history includes Cancer (age of onset: 24) in her mother; Cirrhosis in her sister; Heart attack in her brother; Heart attack (age of onset: 24) in her father; Hyperlipidemia in her son and son; Hypertension in her sister; Other in her sister  and sister; Stroke in her father.  ROS:   Please see the history of present illness.     All other systems reviewed and are negative.   Prior CV studies:   The following studies were reviewed today:  Echo 03/08/15 Study Conclusions   - Left ventricle: The cavity size was normal. Wall thickness was   increased in a pattern of severe LVH. Systolic function was   vigorous. The estimated ejection fraction was in the range of 65%   to 70%. Wall motion was normal; there were no regional wall   motion  abnormalities. Doppler parameters are consistent with   abnormal left ventricular relaxation (grade 1 diastolic   dysfunction). - Aortic valve: There was moderate stenosis. Valve area (VTI): 1.28   cm^2. Valve area (Vmax): 0.87 cm^2. Valve area (Vmean): 1.33   cm^2. - Mitral valve: Mildly calcified annulus.   Transthoracic echocardiography.  M-mode, complete 2D, spectral Doppler, and color Doppler.  Birthdate:  Patient birthdate: 07/02/1919.  Age:  Patient is 85 yr old.  Sex:  Gender: female. BMI: 26.5 kg/m^2.  Blood pressure:     142/42  Patient status: Inpatient.  Study date:  Study date: 03/08/2015. Study time: 11:42 AM.  Location:  Bedside.     Labs/Other Tests and Data Reviewed:    EKG:  An ECG dated 01/05/18 was personally reviewed today and demonstrated:  sinus bradycardia with LVH and poor R wave progression  Recent Labs: No results found for requested labs within last 8760 hours.   Recent Lipid Panel No results found for: CHOL, TRIG, HDL, CHOLHDL, LDLCALC, LDLDIRECT  Wt Readings from Last 3 Encounters:  01/05/19 110 lb 6.4 oz (50.1 kg)  01/05/18 113 lb (51.3 kg)  12/24/16 117 lb 9.6 oz (53.3 kg)     Objective:    Vital Signs:  BP (!) 176/62   Pulse (!) 58   Ht 4\' 11"  (1.499 m)   Wt 110 lb 6.4 oz (50.1 kg)   BMI 22.30 kg/m    VITAL SIGNS:  reviewed  ASSESSMENT & PLAN:    1. Hypertrophic cardiomyopathy chronic dyspnea on exertion going up an incline unchanged in the past several years. Hold off on echo unless change in symptoms 2. Moderate AS on echo 2016 will order echo if she has a change in symptoms 3. HTN with LVH BP running high today but she checks it daily and it has been stable. No changes today. 4. Chronic diastolic CHF-no swelling   COVID-19 Education: The signs and symptoms of COVID-19 were discussed with the patient and how to seek care for testing (follow up with PCP or arrange E-visit).   The importance of social distancing was discussed today.   Time:   Today, I have spent 17 minutes with the patient with telehealth technology discussing the above problems.     Medication Adjustments/Labs and Tests Ordered: Current medicines are reviewed at length with the patient today.  Concerns regarding medicines are outlined above.   Tests Ordered: No orders of the defined types were placed in this encounter.   Medication Changes: No orders of the defined types were placed in this encounter.   Disposition:  Follow up in 1 year(s) Dr. Irish Lack  Signed, Ermalinda Barrios, PA-C  01/05/2019 9:44 AM    [place]

## 2019-01-05 NOTE — Patient Instructions (Signed)
Medication Instructions:  Your physician recommends that you continue on your current medications as directed. Please refer to the Current Medication list given to you today.  If you need a refill on your cardiac medications before your next appointment, please call your pharmacy.   Lab work: None Ordered  If you have labs (blood work) drawn today and your tests are completely normal, you will receive your results only by: Marland Kitchen MyChart Message (if you have MyChart) OR . A paper copy in the mail If you have any lab test that is abnormal or we need to change your treatment, we will call you to review the results.  Testing/Procedures: None ordered  Follow-Up: At [facility], you and your health needs are our priority.  As part of our continuing mission to provide you with exceptional heart care, we have created designated Provider Care Teams.  These Care Teams include your primary Cardiologist (physician) and Advanced Practice Providers (APPs -  Physician Assistants and Nurse Practitioners) who all work together to provide you with the care you need, when you need it. . You will need a follow up appointment in 1 year.  Please call our office 2 months in advance to schedule this appointment.  You may see Casandra Doffing, MD or one of the following Advanced Practice Providers on your designated Care Team:   . Lyda Jester, PA-C . Dayna Dunn, PA-C . Ermalinda Barrios, PA-C  Any Other Special Instructions Will Be Listed Below (If Applicable).  Check your Blood Pressure 2 hours after your medications and keep a log.

## 2019-01-10 ENCOUNTER — Ambulatory Visit: Payer: Medicare HMO | Admitting: Interventional Cardiology

## 2019-01-14 DIAGNOSIS — B372 Candidiasis of skin and nail: Secondary | ICD-10-CM | POA: Diagnosis not present

## 2019-02-20 ENCOUNTER — Encounter (HOSPITAL_COMMUNITY): Payer: Self-pay | Admitting: Oncology

## 2019-02-20 ENCOUNTER — Other Ambulatory Visit: Payer: Self-pay

## 2019-02-20 ENCOUNTER — Inpatient Hospital Stay (HOSPITAL_COMMUNITY)
Admission: EM | Admit: 2019-02-20 | Discharge: 2019-03-12 | DRG: 335 | Disposition: A | Discharge status: Hospice | Payer: Medicare HMO | Attending: Internal Medicine | Admitting: Internal Medicine

## 2019-02-20 DIAGNOSIS — Z79899 Other long term (current) drug therapy: Secondary | ICD-10-CM

## 2019-02-20 DIAGNOSIS — D72829 Elevated white blood cell count, unspecified: Secondary | ICD-10-CM

## 2019-02-20 DIAGNOSIS — I10 Essential (primary) hypertension: Secondary | ICD-10-CM | POA: Diagnosis present

## 2019-02-20 DIAGNOSIS — I6521 Occlusion and stenosis of right carotid artery: Secondary | ICD-10-CM | POA: Diagnosis present

## 2019-02-20 DIAGNOSIS — R110 Nausea: Secondary | ICD-10-CM | POA: Diagnosis not present

## 2019-02-20 DIAGNOSIS — E785 Hyperlipidemia, unspecified: Secondary | ICD-10-CM | POA: Diagnosis present

## 2019-02-20 DIAGNOSIS — Z20828 Contact with and (suspected) exposure to other viral communicable diseases: Secondary | ICD-10-CM | POA: Diagnosis not present

## 2019-02-20 DIAGNOSIS — E039 Hypothyroidism, unspecified: Secondary | ICD-10-CM | POA: Diagnosis present

## 2019-02-20 DIAGNOSIS — R1084 Generalized abdominal pain: Secondary | ICD-10-CM | POA: Diagnosis not present

## 2019-02-20 DIAGNOSIS — N183 Chronic kidney disease, stage 3 unspecified: Secondary | ICD-10-CM | POA: Diagnosis present

## 2019-02-20 DIAGNOSIS — K9189 Other postprocedural complications and disorders of digestive system: Secondary | ICD-10-CM | POA: Diagnosis not present

## 2019-02-20 DIAGNOSIS — I5032 Chronic diastolic (congestive) heart failure: Secondary | ICD-10-CM | POA: Diagnosis present

## 2019-02-20 DIAGNOSIS — K567 Ileus, unspecified: Secondary | ICD-10-CM | POA: Diagnosis not present

## 2019-02-20 DIAGNOSIS — K5652 Intestinal adhesions [bands] with complete obstruction: Principal | ICD-10-CM | POA: Diagnosis present

## 2019-02-20 DIAGNOSIS — K56609 Unspecified intestinal obstruction, unspecified as to partial versus complete obstruction: Secondary | ICD-10-CM | POA: Diagnosis present

## 2019-02-20 DIAGNOSIS — R1310 Dysphagia, unspecified: Secondary | ICD-10-CM | POA: Diagnosis not present

## 2019-02-20 DIAGNOSIS — R5381 Other malaise: Secondary | ICD-10-CM | POA: Diagnosis not present

## 2019-02-20 DIAGNOSIS — Z0189 Encounter for other specified special examinations: Secondary | ICD-10-CM

## 2019-02-20 DIAGNOSIS — E878 Other disorders of electrolyte and fluid balance, not elsewhere classified: Secondary | ICD-10-CM | POA: Diagnosis present

## 2019-02-20 DIAGNOSIS — J690 Pneumonitis due to inhalation of food and vomit: Secondary | ICD-10-CM | POA: Diagnosis not present

## 2019-02-20 DIAGNOSIS — I081 Rheumatic disorders of both mitral and tricuspid valves: Secondary | ICD-10-CM | POA: Diagnosis present

## 2019-02-20 DIAGNOSIS — F329 Major depressive disorder, single episode, unspecified: Secondary | ICD-10-CM | POA: Diagnosis present

## 2019-02-20 DIAGNOSIS — E860 Dehydration: Secondary | ICD-10-CM | POA: Diagnosis present

## 2019-02-20 DIAGNOSIS — Z681 Body mass index (BMI) 19 or less, adult: Secondary | ICD-10-CM

## 2019-02-20 DIAGNOSIS — Z66 Do not resuscitate: Secondary | ICD-10-CM | POA: Diagnosis present

## 2019-02-20 DIAGNOSIS — I421 Obstructive hypertrophic cardiomyopathy: Secondary | ICD-10-CM | POA: Diagnosis present

## 2019-02-20 DIAGNOSIS — J9811 Atelectasis: Secondary | ICD-10-CM | POA: Diagnosis present

## 2019-02-20 DIAGNOSIS — I5033 Acute on chronic diastolic (congestive) heart failure: Secondary | ICD-10-CM | POA: Diagnosis present

## 2019-02-20 DIAGNOSIS — R52 Pain, unspecified: Secondary | ICD-10-CM | POA: Diagnosis not present

## 2019-02-20 DIAGNOSIS — Z79890 Hormone replacement therapy (postmenopausal): Secondary | ICD-10-CM

## 2019-02-20 DIAGNOSIS — E870 Hyperosmolality and hypernatremia: Secondary | ICD-10-CM | POA: Diagnosis present

## 2019-02-20 DIAGNOSIS — D631 Anemia in chronic kidney disease: Secondary | ICD-10-CM | POA: Diagnosis present

## 2019-02-20 DIAGNOSIS — R339 Retention of urine, unspecified: Secondary | ICD-10-CM | POA: Diagnosis not present

## 2019-02-20 DIAGNOSIS — E1122 Type 2 diabetes mellitus with diabetic chronic kidney disease: Secondary | ICD-10-CM | POA: Diagnosis present

## 2019-02-20 DIAGNOSIS — R0600 Dyspnea, unspecified: Secondary | ICD-10-CM

## 2019-02-20 DIAGNOSIS — Z7982 Long term (current) use of aspirin: Secondary | ICD-10-CM

## 2019-02-20 DIAGNOSIS — E440 Moderate protein-calorie malnutrition: Secondary | ICD-10-CM | POA: Diagnosis present

## 2019-02-20 DIAGNOSIS — E876 Hypokalemia: Secondary | ICD-10-CM | POA: Diagnosis present

## 2019-02-20 DIAGNOSIS — I130 Hypertensive heart and chronic kidney disease with heart failure and stage 1 through stage 4 chronic kidney disease, or unspecified chronic kidney disease: Secondary | ICD-10-CM | POA: Diagnosis present

## 2019-02-20 DIAGNOSIS — Z8249 Family history of ischemic heart disease and other diseases of the circulatory system: Secondary | ICD-10-CM

## 2019-02-20 DIAGNOSIS — Z90710 Acquired absence of both cervix and uterus: Secondary | ICD-10-CM

## 2019-02-20 LAB — COMPREHENSIVE METABOLIC PANEL
ALT: 13 U/L (ref 0–44)
AST: 23 U/L (ref 15–41)
Albumin: 3.7 g/dL (ref 3.5–5.0)
Alkaline Phosphatase: 81 U/L (ref 38–126)
Anion gap: 12 (ref 5–15)
BUN: 24 mg/dL — ABNORMAL HIGH (ref 8–23)
CO2: 22 mmol/L (ref 22–32)
Calcium: 9.1 mg/dL (ref 8.9–10.3)
Chloride: 101 mmol/L (ref 98–111)
Creatinine, Ser: 1.08 mg/dL — ABNORMAL HIGH (ref 0.44–1.00)
GFR calc Af Amer: 49 mL/min — ABNORMAL LOW (ref >60)
GFR calc non Af Amer: 43 mL/min — ABNORMAL LOW (ref >60)
Glucose, Bld: 152 mg/dL — ABNORMAL HIGH (ref 70–99)
Potassium: 4.5 mmol/L (ref 3.5–5.1)
Sodium: 135 mmol/L (ref 135–145)
Total Bilirubin: 0.6 mg/dL (ref 0.3–1.2)
Total Protein: 7.2 g/dL (ref 6.5–8.1)

## 2019-02-20 LAB — CBC
HCT: 44 % (ref 36.0–46.0)
Hemoglobin: 15 g/dL (ref 12.0–15.0)
MCH: 33.1 pg (ref 26.0–34.0)
MCHC: 34.1 g/dL (ref 30.0–36.0)
MCV: 97.1 fL (ref 80.0–100.0)
Platelets: 253 10*3/uL (ref 150–400)
RBC: 4.53 MIL/uL (ref 3.87–5.11)
RDW: 14 % (ref 11.5–15.5)
WBC: 13.9 10*3/uL — ABNORMAL HIGH (ref 4.0–10.5)
nRBC: 0 % (ref 0.0–0.2)

## 2019-02-20 LAB — LIPASE, BLOOD: Lipase: 34 U/L (ref 11–51)

## 2019-02-20 MED ORDER — SODIUM CHLORIDE 0.9% FLUSH: 3.0000 mL | Freq: Once | INTRAVENOUS | Status: DC

## 2019-02-20 MED ORDER — ONDANSETRON HCL 4 MG/2ML IJ SOLN
4.0000 mg | Freq: Once | INTRAMUSCULAR | Status: AC
  Administered 2019-02-20: 4 mg via INTRAVENOUS
  Filled 2019-02-20: qty 2

## 2019-02-20 MED ORDER — FENTANYL CITRATE (PF) 100 MCG/2ML IJ SOLN
25.0000 ug | Freq: Once | INTRAMUSCULAR | Status: AC
  Administered 2019-02-20: 25 ug via INTRAVENOUS
  Filled 2019-02-20: qty 2

## 2019-02-20 NOTE — ED Triage Notes (Signed)
Pt bib PTAR from home d/t nausea, abdominal pain and weakness starting yesterday.  Pt reports poor po intake and has not taken daily medications including BP medication.

## 2019-02-20 NOTE — ED Provider Notes (Signed)
[place] EMERGENCY DEPARTMENT Provider Note   CSN: [id number] Arrival date & time: 02/20/19  2205    History   Chief Complaint Chief Complaint  Patient presents with   Abdominal Pain    HPI Tina Weaver is a 85 y.o. female with a significant PMH of HTN with LVH, hypertrophic cardiomyopathy, diastolic HF, and aortic stenosis who presents to the ED with generalized abdominal pain. Pt states she was in her usual state of health until 2 days ago when she began having abdominal pain and associated generalized weakness. Since then she has been fatigued and unable to get out of bed. Endorses nausea and chest pain. Denies fever, vomiting, diarrhea, constipation, or dysuria. Pt states she was able to eat some applesauce yesterday and drink a cup of water.     Abdominal Pain Pain location:  Generalized Pain quality: aching and stabbing   Pain radiates to:  Chest Onset quality:  Gradual Duration:  2 days Timing:  Constant Progression:  Improving Chronicity:  New Context: not diet changes, not recent illness, not sick contacts and not suspicious food intake   Ineffective treatments:  None tried Associated symptoms: belching, chest pain, fatigue and nausea   Associated symptoms: no chills, no constipation, no cough, no diarrhea, no dysuria, no fever, no shortness of breath, no sore throat and no vomiting   Risk factors: being elderly   Risk factors: no NSAID use     Past Medical History:  Diagnosis Date   CHF (congestive heart failure) ([facility])    DOE (dyspnea on exertion) 09/11/2015   Hypertension     Patient Active Problem List   Diagnosis Date Noted   SBO (small bowel obstruction) ([place]) 02/21/2019   Hypertrophic cardiomyopathy ([place]) 01/04/2019   Hypertensive heart disease 12/24/2016   Chronic diastolic heart failure ([place]) 09/11/2015   DOE (dyspnea on exertion) 09/11/2015   Dyspnea    Encounter for palliative care    Acute diastolic heart failure  secondary to hypertrophic cardiomyopathy ([place]) 03/06/2015   Mild tricuspid regurgitation 03/06/2015   Moderate mitral regurgitation 03/06/2015   Acute on chronic hyponatremia 03/06/2015   Hypothyroidism 03/06/2015   HTN (hypertension) 03/06/2015   Anemia of chronic disease 03/06/2015   CKD (chronic kidney disease), stage III ([place]) 03/06/2015   Moderate aortic stenosis 03/06/2015   Acute respiratory failure with hypoxia ([place]) 03/06/2015    Past Surgical History:  Procedure Laterality Date   ABDOMINAL HYSTERECTOMY       OB History   No obstetric history on file.     Home Medications    Prior to Admission medications   Medication Sig Start Date End Date Taking? Authorizing Provider  aspirin 81 MG chewable tablet Chew 81 mg by mouth daily.   Yes [provider]  carvedilol (COREG) 12.5 MG tablet TAKE 1 AND 1/2 TABLETS BY MOUTH TWICE DAILY Patient taking differently: Take 18.75 mg by mouth 2 (two) times daily with a meal.  10/01/18  Yes Jettie Booze, MD  furosemide (LASIX) 20 MG tablet TAKE 1 TABLET BY MOUTH EVERY DAY Patient taking differently: Take 20 mg by mouth daily.  10/01/18  Yes Jettie Booze, MD  levothyroxine (SYNTHROID, LEVOTHROID) 88 MCG tablet Take 88 mcg by mouth daily. 03/21/16  Yes [provider]  potassium chloride SA (K-DUR,KLOR-CON) 20 MEQ tablet TAKE 1 TABLET BY MOUTH 2 TIMES A DAY Patient taking differently: Take 20 mEq by mouth daily.  04/13/18  Yes Jettie Booze, MD  rosuvastatin (  CRESTOR) 5 MG tablet Take 1 tablet (5 mg total) by mouth daily at 6 PM. Please keep upcoming appt for future refills. Thank you 01/03/19  Yes Jettie Booze, MD   Family History Family History  Problem Relation Age of Onset   Cancer Mother 19   Heart attack Father 98       HEART DISEASE   Stroke Father    Heart attack Brother    Cirrhosis Sister    Other Sister        HEALTHY   Other Sister        HEALTHY    Hyperlipidemia Son    Hyperlipidemia Son    Hypertension Sister     Social History Social History   Tobacco Use   Smoking status: Never Smoker   Smokeless tobacco: Never Used  Substance Use Topics   Alcohol use: No   Drug use: Not on file  Of note, pt lives alone at home and performs ADLs independently. Son has been grocery shopping for her recently to keep her isolated during [place] pandemic.  Allergies   Norvasc [amlodipine], Alendronate, and Fosamax [alendronate sodium]   Review of Systems Review of Systems  Constitutional: Positive for fatigue. Negative for chills and fever.  HENT: Negative for sore throat.   Respiratory: Negative for cough, chest tightness and shortness of breath.   Cardiovascular: Positive for chest pain.  Gastrointestinal: Positive for abdominal pain and nausea. Negative for constipation, diarrhea and vomiting.  Genitourinary: Negative for dysuria, frequency and urgency.  Neurological: Negative for headaches.    Physical Exam Updated Vital Signs BP (!) 173/71    Pulse 70    Temp 97.6 F (36.4 C) (Oral)    Resp 19    Ht 5' (1.524 m)    Wt 50.8 kg    SpO2 99%    BMI 21.87 kg/m   Physical Exam Vitals signs and nursing note reviewed.  Constitutional:      General: She is not in acute distress.    Appearance: She is ill-appearing.  HENT:     Head: Normocephalic and atraumatic.  Eyes:     Conjunctiva/sclera: Conjunctivae normal.  Neck:     Musculoskeletal: Neck supple.  Cardiovascular:     Rate and Rhythm: Normal rate and regular rhythm.     Pulses:          Dorsalis pedis pulses are 2+ on the right side and 2+ on the left side.     Heart sounds: Murmur present. Systolic murmur present with a grade of 3/6.  Pulmonary:     Effort: Pulmonary effort is normal. No respiratory distress.     Breath sounds: Normal breath sounds.  Abdominal:     General: Bowel sounds are decreased. There is distension.     Palpations: Abdomen is soft.      Tenderness: There is generalized abdominal tenderness. There is no guarding or rebound.  Musculoskeletal:     Right lower leg: Edema present.     Left lower leg: Edema present.  Skin:    General: Skin is warm and dry.  Neurological:     Mental Status: She is alert.    ED Treatments / Results  Labs (all labs ordered are listed, but only abnormal results are displayed) Labs Reviewed  COMPREHENSIVE METABOLIC PANEL - Abnormal; Notable for the following components:      Result Value   Glucose, Bld 152 (*)    BUN 24 (*)    Creatinine, Ser 1.08 (*)  GFR calc non Af Amer 43 (*)    GFR calc Af Amer 49 (*)    All other components within normal limits  CBC - Abnormal; Notable for the following components:   WBC 13.9 (*)    All other components within normal limits  SARS CORONAVIRUS 2 (HOSPITAL ORDER, [facility])  LIPASE, BLOOD  URINALYSIS, ROUTINE W REFLEX MICROSCOPIC  BASIC METABOLIC PANEL    EKG None  Radiology Ct Abdomen Pelvis W Contrast  Result Date: 02/21/2019 CLINICAL DATA:  Nausea, abdominal pain, weakness EXAM: CT ABDOMEN AND PELVIS WITH CONTRAST TECHNIQUE: Multidetector CT imaging of the abdomen and pelvis was performed using the standard protocol following bolus administration of intravenous contrast. CONTRAST:  83mL OMNIPAQUE IOHEXOL 300 MG/ML  SOLN COMPARISON:  01/30/2016 FINDINGS: Lower chest: Trace right pleural effusion. Hepatobiliary: Liver is within normal limits. Gallbladder is unremarkable. No intrahepatic or extrahepatic ductal dilatation. Pancreas: Within normal limits. Spleen: Within normal limits. Adrenals/Urinary Tract: Adrenal glands are within normal limits. Kidneys are within normal limits.  No hydronephrosis. Bladder is within normal limits. Stomach/Bowel: Stomach is within normal limits. Multiple dilated loops of small bowel in the lower abdomen, with abrupt transition point in the posterior mid abdomen (series 3/image 45; coronal  image 54). Decompressed loops of ileum in the right mid/lower abdomen (series 3/image 38). This appearance is compatible with high-grade partial or complete small bowel obstruction. Appendix is not discretely visualized. Left colon is decompressed. Vascular/Lymphatic: No evidence of abdominal aortic aneurysm. Atherosclerotic calcifications of the abdominal aorta and branch vessels. No suspicious abdominopelvic lymphadenopathy. Reproductive: Status post hysterectomy. Bilateral ovaries are unremarkable. Other: Small volume abdominopelvic ascites. No free air. Musculoskeletal: Degenerative changes of the visualized thoracolumbar spine. IMPRESSION: High-grade partial or complete small bowel obstruction, with transition point in the posterior mid abdomen, as above. Small volume abdominopelvic ascites.  No free air. Trace right pleural effusion. Electronically Signed   By: Julian Hy M.D.   On: 02/21/2019 02:03    Procedures Procedures (including critical care time)  Medications Ordered in ED Medications  sodium chloride flush (NS) 0.9 % injection 3 mL (has no administration in time range)  carvedilol (COREG) tablet 18.75 mg (has no administration in time range)  levothyroxine (SYNTHROID) tablet 88 mcg (has no administration in time range)  rosuvastatin (CRESTOR) tablet 5 mg (has no administration in time range)  acetaminophen (TYLENOL) tablet 650 mg (has no administration in time range)    Or  acetaminophen (TYLENOL) suppository 650 mg (has no administration in time range)  ondansetron (ZOFRAN) tablet 4 mg (has no administration in time range)    Or  ondansetron (ZOFRAN) injection 4 mg (has no administration in time range)  enoxaparin (LOVENOX) injection 40 mg (has no administration in time range)  sodium chloride 0.9 % bolus 500 mL (0 mLs Intravenous Stopped 02/21/19 0247)    Followed by  0.9 %  sodium chloride infusion (has no administration in time range)  ondansetron (ZOFRAN) injection 4  mg (4 mg Intravenous Given 02/20/19 2354)  fentaNYL (SUBLIMAZE) injection 25 mcg (25 mcg Intravenous Given 02/20/19 2354)  iohexol (OMNIPAQUE) 300 MG/ML solution 80 mL (80 mLs Intravenous Contrast Given 02/21/19 0116)     Initial Impression / Assessment and Plan / ED Course  I have reviewed the triage vital signs and the nursing notes.  Pertinent labs & imaging results that were available during my care of the patient were reviewed by me and considered in my medical decision making (see chart  for details).   Tina Weaver is a 85 y.o. female with a significant PMH of HTN with LVH, hypertrophic cardiomyopathy, diastolic HF, and aortic stenosis presenting with generalized abdominal pain and found to have a high grade partial or complete small bowel obstruction. Pt continued to be NPO and NG tube was ordered. Pt remained clinically stable will in the ED and reported improvement in pain control. General surgery and hospitalists were called for admission.      Final Clinical Impressions(s) / ED Diagnoses   Final diagnoses:  SBO (small bowel obstruction) [facility])    ED Discharge Orders    None      Ladona Horns, MD 02/21/19 [id number]  Fatima Blank, MD 02/21/19 [phone]

## 2019-02-21 ENCOUNTER — Inpatient Hospital Stay (HOSPITAL_COMMUNITY): Payer: Medicare HMO

## 2019-02-21 ENCOUNTER — Encounter (HOSPITAL_COMMUNITY): Payer: Self-pay | Admitting: Internal Medicine

## 2019-02-21 ENCOUNTER — Emergency Department (HOSPITAL_COMMUNITY): Payer: Medicare HMO

## 2019-02-21 DIAGNOSIS — K5669 Other partial intestinal obstruction: Secondary | ICD-10-CM | POA: Diagnosis not present

## 2019-02-21 DIAGNOSIS — R0602 Shortness of breath: Secondary | ICD-10-CM | POA: Diagnosis not present

## 2019-02-21 DIAGNOSIS — J9811 Atelectasis: Secondary | ICD-10-CM | POA: Diagnosis present

## 2019-02-21 DIAGNOSIS — E878 Other disorders of electrolyte and fluid balance, not elsewhere classified: Secondary | ICD-10-CM | POA: Diagnosis present

## 2019-02-21 DIAGNOSIS — R0600 Dyspnea, unspecified: Secondary | ICD-10-CM | POA: Diagnosis not present

## 2019-02-21 DIAGNOSIS — E1122 Type 2 diabetes mellitus with diabetic chronic kidney disease: Secondary | ICD-10-CM | POA: Diagnosis present

## 2019-02-21 DIAGNOSIS — R109 Unspecified abdominal pain: Secondary | ICD-10-CM | POA: Diagnosis not present

## 2019-02-21 DIAGNOSIS — K56699 Other intestinal obstruction unspecified as to partial versus complete obstruction: Secondary | ICD-10-CM | POA: Diagnosis not present

## 2019-02-21 DIAGNOSIS — K567 Ileus, unspecified: Secondary | ICD-10-CM | POA: Diagnosis not present

## 2019-02-21 DIAGNOSIS — I350 Nonrheumatic aortic (valve) stenosis: Secondary | ICD-10-CM | POA: Diagnosis not present

## 2019-02-21 DIAGNOSIS — I6521 Occlusion and stenosis of right carotid artery: Secondary | ICD-10-CM | POA: Diagnosis present

## 2019-02-21 DIAGNOSIS — Z66 Do not resuscitate: Secondary | ICD-10-CM | POA: Diagnosis present

## 2019-02-21 DIAGNOSIS — N183 Chronic kidney disease, stage 3 (moderate): Secondary | ICD-10-CM

## 2019-02-21 DIAGNOSIS — I10 Essential (primary) hypertension: Secondary | ICD-10-CM | POA: Diagnosis not present

## 2019-02-21 DIAGNOSIS — E876 Hypokalemia: Secondary | ICD-10-CM | POA: Diagnosis present

## 2019-02-21 DIAGNOSIS — R339 Retention of urine, unspecified: Secondary | ICD-10-CM | POA: Diagnosis not present

## 2019-02-21 DIAGNOSIS — K56609 Unspecified intestinal obstruction, unspecified as to partial versus complete obstruction: Secondary | ICD-10-CM | POA: Diagnosis not present

## 2019-02-21 DIAGNOSIS — E039 Hypothyroidism, unspecified: Secondary | ICD-10-CM | POA: Diagnosis present

## 2019-02-21 DIAGNOSIS — I081 Rheumatic disorders of both mitral and tricuspid valves: Secondary | ICD-10-CM | POA: Diagnosis present

## 2019-02-21 DIAGNOSIS — K9189 Other postprocedural complications and disorders of digestive system: Secondary | ICD-10-CM | POA: Diagnosis not present

## 2019-02-21 DIAGNOSIS — K565 Intestinal adhesions [bands], unspecified as to partial versus complete obstruction: Secondary | ICD-10-CM | POA: Diagnosis not present

## 2019-02-21 DIAGNOSIS — Z431 Encounter for attention to gastrostomy: Secondary | ICD-10-CM | POA: Diagnosis not present

## 2019-02-21 DIAGNOSIS — I5032 Chronic diastolic (congestive) heart failure: Secondary | ICD-10-CM | POA: Diagnosis not present

## 2019-02-21 DIAGNOSIS — E870 Hyperosmolality and hypernatremia: Secondary | ICD-10-CM | POA: Diagnosis not present

## 2019-02-21 DIAGNOSIS — J690 Pneumonitis due to inhalation of food and vomit: Secondary | ICD-10-CM | POA: Diagnosis not present

## 2019-02-21 DIAGNOSIS — I130 Hypertensive heart and chronic kidney disease with heart failure and stage 1 through stage 4 chronic kidney disease, or unspecified chronic kidney disease: Secondary | ICD-10-CM | POA: Diagnosis not present

## 2019-02-21 DIAGNOSIS — K598 Other specified functional intestinal disorders: Secondary | ICD-10-CM | POA: Diagnosis not present

## 2019-02-21 DIAGNOSIS — Z01810 Encounter for preprocedural cardiovascular examination: Secondary | ICD-10-CM | POA: Diagnosis not present

## 2019-02-21 DIAGNOSIS — Z20828 Contact with and (suspected) exposure to other viral communicable diseases: Secondary | ICD-10-CM | POA: Diagnosis not present

## 2019-02-21 DIAGNOSIS — D631 Anemia in chronic kidney disease: Secondary | ICD-10-CM | POA: Diagnosis present

## 2019-02-21 DIAGNOSIS — I421 Obstructive hypertrophic cardiomyopathy: Secondary | ICD-10-CM | POA: Diagnosis not present

## 2019-02-21 DIAGNOSIS — E860 Dehydration: Secondary | ICD-10-CM | POA: Diagnosis present

## 2019-02-21 DIAGNOSIS — K5652 Intestinal adhesions [bands] with complete obstruction: Secondary | ICD-10-CM | POA: Diagnosis not present

## 2019-02-21 DIAGNOSIS — Z681 Body mass index (BMI) 19 or less, adult: Secondary | ICD-10-CM | POA: Diagnosis not present

## 2019-02-21 DIAGNOSIS — E440 Moderate protein-calorie malnutrition: Secondary | ICD-10-CM | POA: Diagnosis not present

## 2019-02-21 DIAGNOSIS — K5651 Intestinal adhesions [bands], with partial obstruction: Secondary | ICD-10-CM | POA: Diagnosis not present

## 2019-02-21 DIAGNOSIS — I5033 Acute on chronic diastolic (congestive) heart failure: Secondary | ICD-10-CM | POA: Diagnosis not present

## 2019-02-21 DIAGNOSIS — R1310 Dysphagia, unspecified: Secondary | ICD-10-CM | POA: Diagnosis not present

## 2019-02-21 LAB — SARS CORONAVIRUS 2 BY RT PCR (HOSPITAL ORDER, PERFORMED IN ~~LOC~~ HOSPITAL LAB): SARS Coronavirus 2: NEGATIVE

## 2019-02-21 LAB — BASIC METABOLIC PANEL
Anion gap: 12 (ref 5–15)
BUN: 21 mg/dL (ref 8–23)
CO2: 22 mmol/L (ref 22–32)
Calcium: 8.8 mg/dL — ABNORMAL LOW (ref 8.9–10.3)
Chloride: 103 mmol/L (ref 98–111)
Creatinine, Ser: 1 mg/dL (ref 0.44–1.00)
GFR calc Af Amer: 54 mL/min — ABNORMAL LOW (ref >60)
GFR calc non Af Amer: 47 mL/min — ABNORMAL LOW (ref >60)
Glucose, Bld: 129 mg/dL — ABNORMAL HIGH (ref 70–99)
Potassium: 4.4 mmol/L (ref 3.5–5.1)
Sodium: 137 mmol/L (ref 135–145)

## 2019-02-21 MED ORDER — SODIUM CHLORIDE 0.9 % IV SOLN
1000.0000 mL | INTRAVENOUS | Status: DC
  Administered 2019-02-21 – 2019-02-23 (×4): 1000 mL via INTRAVENOUS

## 2019-02-21 MED ORDER — IOHEXOL 300 MG/ML  SOLN
80.0000 mL | Freq: Once | INTRAMUSCULAR | Status: AC | PRN
  Administered 2019-02-21: 80 mL via INTRAVENOUS

## 2019-02-21 MED ORDER — LIDOCAINE VISCOUS HCL 2 % MT SOLN
OROMUCOSAL | Status: AC
  Administered 2019-02-21: 3 mL via OROMUCOSAL
  Filled 2019-02-21: qty 15

## 2019-02-21 MED ORDER — FENTANYL CITRATE (PF) 100 MCG/2ML IJ SOLN
25.0000 ug | INTRAMUSCULAR | Status: DC | PRN
  Administered 2019-02-28: 25 ug via INTRAVENOUS
  Filled 2019-02-21: qty 2

## 2019-02-21 MED ORDER — ACETAMINOPHEN 650 MG RE SUPP: 650.0000 mg | Freq: Four times a day (QID) | RECTAL | Status: DC | PRN

## 2019-02-21 MED ORDER — CARVEDILOL 6.25 MG PO TABS
18.7500 mg | ORAL_TABLET | Freq: Two times a day (BID) | ORAL | Status: DC
  Administered 2019-02-21 – 2019-02-26 (×10): 18.75 mg via ORAL
  Filled 2019-02-21 (×11): qty 1

## 2019-02-21 MED ORDER — HYDRALAZINE HCL 20 MG/ML IJ SOLN
10.0000 mg | INTRAMUSCULAR | Status: DC | PRN
  Administered 2019-02-21 – 2019-02-24 (×2): 20 mg via INTRAVENOUS
  Administered 2019-02-27: 10 mg via INTRAVENOUS
  Administered 2019-03-03: 20 mg via INTRAVENOUS
  Filled 2019-02-21 (×5): qty 1

## 2019-02-21 MED ORDER — ONDANSETRON HCL 4 MG PO TABS: 4.0000 mg | ORAL_TABLET | Freq: Four times a day (QID) | ORAL | Status: DC | PRN

## 2019-02-21 MED ORDER — ROSUVASTATIN CALCIUM 5 MG PO TABS
5.0000 mg | ORAL_TABLET | Freq: Every day | ORAL | Status: DC
  Administered 2019-02-21 – 2019-03-01 (×8): 5 mg via ORAL
  Filled 2019-02-21 (×10): qty 1

## 2019-02-21 MED ORDER — LIDOCAINE VISCOUS HCL 2 % MT SOLN
15.0000 mL | Freq: Once | OROMUCOSAL | Status: AC
  Administered 2019-02-21: 3 mL via OROMUCOSAL

## 2019-02-21 MED ORDER — ACETAMINOPHEN 325 MG PO TABS
650.0000 mg | ORAL_TABLET | Freq: Four times a day (QID) | ORAL | Status: DC | PRN
  Administered 2019-03-11: 650 mg via ORAL
  Filled 2019-02-21: qty 2

## 2019-02-21 MED ORDER — SODIUM CHLORIDE 0.9 % IV SOLN: 1000.0000 mL | INTRAVENOUS | Status: DC

## 2019-02-21 MED ORDER — LEVOTHYROXINE SODIUM 88 MCG PO TABS
88.0000 ug | ORAL_TABLET | Freq: Every day | ORAL | Status: DC
  Administered 2019-02-21 – 2019-03-12 (×17): 88 ug via ORAL
  Filled 2019-02-21 (×18): qty 1

## 2019-02-21 MED ORDER — DIATRIZOATE MEGLUMINE & SODIUM 66-10 % PO SOLN
90.0000 mL | Freq: Once | ORAL | Status: AC
  Administered 2019-02-21: 90 mL via NASOGASTRIC
  Filled 2019-02-21: qty 90

## 2019-02-21 MED ORDER — ENOXAPARIN SODIUM 40 MG/0.4ML ~~LOC~~ SOLN
40.0000 mg | SUBCUTANEOUS | Status: DC
  Filled 2019-02-21: qty 0.4

## 2019-02-21 MED ORDER — SODIUM CHLORIDE 0.9 % IV BOLUS (SEPSIS)
500.0000 mL | Freq: Once | INTRAVENOUS | Status: AC
  Administered 2019-02-21: 500 mL via INTRAVENOUS

## 2019-02-21 MED ORDER — ENOXAPARIN SODIUM 30 MG/0.3ML ~~LOC~~ SOLN
30.0000 mg | SUBCUTANEOUS | Status: DC
  Administered 2019-02-21 – 2019-03-11 (×19): 30 mg via SUBCUTANEOUS
  Filled 2019-02-21 (×19): qty 0.3

## 2019-02-21 MED ORDER — ONDANSETRON HCL 4 MG/2ML IJ SOLN
4.0000 mg | Freq: Four times a day (QID) | INTRAMUSCULAR | Status: DC | PRN
  Administered 2019-02-28 (×2): 4 mg via INTRAVENOUS
  Filled 2019-02-21: qty 2

## 2019-02-21 NOTE — Consult Note (Signed)
Surgical Consultation Requesting provider: Dr. Leonette Monarch CC: abdominal pain  HPI: 85yo woman with HTN, LVH, aortic stenosis, hypertrophic cardiomyopathy, diastolic heart failure who developed generalized abdominal discomfort about 3 days prior to presentation. This was associated with nausea, chest pain and generalized weakness. Reports last bowel movement was just before symptoms began. Denies fever. Reports poor oral intake and has been too weak to get out of bed.   Surgical history notable for total hysterectomy, appendectomy by her report.  Allergies  Allergen Reactions  . Norvasc [Amlodipine] Swelling    Lower extremity edema with 10 mg dose, tolerates 5mg  without problems. Lower extremity edema with 10 mg dose, tolerates 5mg  without problems.  . Alendronate Other (See Comments)    Pain to ext's  . Fosamax [Alendronate Sodium] Other (See Comments)    "Pain to legs" per pt's family member    Past Medical History:  Diagnosis Date  . CHF (congestive heart failure) ([place])   . DOE (dyspnea on exertion) 09/11/2015  . Hypertension    Patient Active Problem List   Diagnosis Date Noted  . Hypertrophic cardiomyopathy ([place]) 01/04/2019  . Hypertensive heart disease 12/24/2016  . Chronic diastolic heart failure ([place]) 09/11/2015  . DOE (dyspnea on exertion) 09/11/2015  . Dyspnea   . Encounter for palliative care   . Acute diastolic heart failure secondary to hypertrophic cardiomyopathy ([place]) 03/06/2015  . Mild tricuspid regurgitation 03/06/2015  . Moderate mitral regurgitation 03/06/2015  . Acute on chronic hyponatremia 03/06/2015  . Hypothyroidism 03/06/2015  . HTN (hypertension) 03/06/2015  . Anemia of chronic disease 03/06/2015  . CKD (chronic kidney disease), stage III ([place]) 03/06/2015  . Moderate aortic stenosis 03/06/2015  . Acute respiratory failure with hypoxia ([place]) 03/06/2015    Past Surgical History:  Procedure Laterality Date  . ABDOMINAL HYSTERECTOMY      Family  History  Problem Relation Age of Onset  . Cancer Mother 69  . Heart attack Father 40       HEART DISEASE  . Stroke Father   . Heart attack Brother   . Cirrhosis Sister   . Other Sister        HEALTHY  . Other Sister        HEALTHY  . Hyperlipidemia Son   . Hyperlipidemia Son   . Hypertension Sister     Social History   Socioeconomic History  . Marital status: Widowed    Spouse name: Not on file  . Number of children: Not on file  . Years of education: Not on file  . Highest education level: Not on file  Occupational History  . Not on file  Social Needs  . Financial resource strain: Not on file  . Food insecurity    Worry: Not on file    Inability: Not on file  . Transportation needs    Medical: Not on file    Non-medical: Not on file  Tobacco Use  . Smoking status: Never Smoker  . Smokeless tobacco: Never Used  Substance and Sexual Activity  . Alcohol use: No  . Drug use: Not on file  . Sexual activity: Not on file  Lifestyle  . Physical activity    Days per week: Not on file    Minutes per session: Not on file  . Stress: Not on file  Relationships  . Social Herbalist on phone: Not on file    Gets together: Not on file    Attends religious service: Not on file  Active member of club or organization: Not on file    Attends meetings of clubs or organizations: Not on file    Relationship status: Not on file  Other Topics Concern  . Not on file  Social History Narrative  . Not on file    No current facility-administered medications on file prior to encounter.    Current Outpatient Medications on File Prior to Encounter  Medication Sig Dispense Refill  . aspirin 81 MG chewable tablet Chew 81 mg by mouth daily.    . carvedilol (COREG) 12.5 MG tablet TAKE 1 AND 1/2 TABLETS BY MOUTH TWICE DAILY (Patient taking differently: Take 18.75 mg by mouth 2 (two) times daily with a meal. ) 270 tablet 1  . furosemide (LASIX) 20 MG tablet TAKE 1 TABLET BY  MOUTH EVERY DAY (Patient taking differently: Take 20 mg by mouth daily. ) 90 tablet 1  . levothyroxine (SYNTHROID, LEVOTHROID) 88 MCG tablet Take 88 mcg by mouth daily.    . potassium chloride SA (K-DUR,KLOR-CON) 20 MEQ tablet TAKE 1 TABLET BY MOUTH 2 TIMES A DAY (Patient taking differently: Take 20 mEq by mouth daily. ) 180 tablet 2  . rosuvastatin (CRESTOR) 5 MG tablet Take 1 tablet (5 mg total) by mouth daily at 6 PM. Please keep upcoming appt for future refills. Thank you 90 tablet 0    Review of Systems: a complete, 10pt review of systems was completed with pertinent positives and negatives as documented in the HPI  Physical Exam: Vitals:   02/21/19 0000 02/21/19 0030  BP: (!) 189/66 (!) 146/65  Pulse: 71 69  Resp: 16 16  Temp:    SpO2: 91% 96%   Gen: A&Ox3, no distress  Head: normocephalic, atraumatic Eyes: extraocular motions intact, anicteric.  Neck: supple without mass or thyromegaly Chest: unlabored respirations, symmetrical air entry, clear bilaterally   Cardiovascular: RRR with palpable distal pulses, no pedal edema Abdomen: soft, moderately distended, minimally diffusely tender. No mass or organomegaly.  Extremities: warm, without edema, no deformities  Neuro: grossly intact Psych: appropriate mood and affect, normal insight  Skin: warm and dry   CBC Latest Ref Rng & Units 02/20/2019 05/15/2015 03/09/2015  WBC 4.0 - 10.5 K/uL 13.9(H) 7.9 11.5(H)  Hemoglobin 12.0 - 15.0 g/dL 15.0 11.6(L) 9.2(L)  Hematocrit 36.0 - 46.0 % 44.0 34.5(L) 27.3(L)  Platelets 150 - 400 K/uL 253 403(H) 426(H)    CMP Latest Ref Rng & Units 02/20/2019 05/02/2016 10/31/2015  Glucose 70 - 99 mg/dL 152(H) 88 81  BUN 8 - 23 mg/dL 24(H) 18 28(H)  Creatinine 0.44 - 1.00 mg/dL 1.08(H) 1.00(H) 1.35(H)  Sodium 135 - 145 mmol/L 135 138 130(L)  Potassium 3.5 - 5.1 mmol/L 4.5 4.8 5.3  Chloride 98 - 111 mmol/L 101 105 95(L)  CO2 22 - 32 mmol/L 22 27 25   Calcium 8.9 - 10.3 mg/dL 9.1 9.3 9.2  Total Protein  6.5 - 8.1 g/dL 7.2 - -  Total Bilirubin 0.3 - 1.2 mg/dL 0.6 - -  Alkaline Phos 38 - 126 U/L 81 - -  AST 15 - 41 U/L 23 - -  ALT 0 - 44 U/L 13 - -    No results found for: INR, PROTIME  Imaging: Ct Abdomen Pelvis W Contrast  Result Date: 02/21/2019 CLINICAL DATA:  Nausea, abdominal pain, weakness EXAM: CT ABDOMEN AND PELVIS WITH CONTRAST TECHNIQUE: Multidetector CT imaging of the abdomen and pelvis was performed using the standard protocol following bolus administration of intravenous contrast. CONTRAST:  5mL  OMNIPAQUE IOHEXOL 300 MG/ML  SOLN COMPARISON:  01/30/2016 FINDINGS: Lower chest: Trace right pleural effusion. Hepatobiliary: Liver is within normal limits. Gallbladder is unremarkable. No intrahepatic or extrahepatic ductal dilatation. Pancreas: Within normal limits. Spleen: Within normal limits. Adrenals/Urinary Tract: Adrenal glands are within normal limits. Kidneys are within normal limits.  No hydronephrosis. Bladder is within normal limits. Stomach/Bowel: Stomach is within normal limits. Multiple dilated loops of small bowel in the lower abdomen, with abrupt transition point in the posterior mid abdomen (series 3/image 45; coronal image 54). Decompressed loops of ileum in the right mid/lower abdomen (series 3/image 38). This appearance is compatible with high-grade partial or complete small bowel obstruction. Appendix is not discretely visualized. Left colon is decompressed. Vascular/Lymphatic: No evidence of abdominal aortic aneurysm. Atherosclerotic calcifications of the abdominal aorta and branch vessels. No suspicious abdominopelvic lymphadenopathy. Reproductive: Status post hysterectomy. Bilateral ovaries are unremarkable. Other: Small volume abdominopelvic ascites. No free air. Musculoskeletal: Degenerative changes of the visualized thoracolumbar spine. IMPRESSION: High-grade partial or complete small bowel obstruction, with transition point in the posterior mid abdomen, as above. Small  volume abdominopelvic ascites.  No free air. Trace right pleural effusion. Electronically Signed   By: Julian Hy M.D.   On: 02/21/2019 02:03     A/P: 85yo woman with SBO. She reports her pain is a little better now. No focal tenderness. NG is being inserted. Will plan to start the SBO protocol. Recommend gentle fluid resuscitation, bowel rest/ NG decompression, serial abdominal exams/ labs. Will continue to follow.    Romana Juniper, MD [facility], [place] Pager [phone]

## 2019-02-21 NOTE — Progress Notes (Signed)
  PROGRESS NOTE  Tina Weaver ZOX:[id number] DOB: [birth year] DOA: 02/20/2019 PCP: Lajean Manes, MD  Brief History   Tina Weaver is a 85 y.o. female with medical history significant of HTN, chronic diastolic CHF with hypertensive cardiomyopathy, AS.  Patient presents to the ED with c/o abd pain.  Symptoms onset 3 days ago.  Associated with nausea, CP, generalized weakness.  Last BM was before symptoms onset.    Poor PO intake.  No fever. Vital signs are stable. CT of the abdomen and pelvis demonstrated a high grade SBO. General surgery was consulted and NGT has been placed. She is NPO.  Consultants  . General Surgery  Procedures  . NGT placement  Antibiotics   Anti-infectives (From admission, onward)   None    .  Subjective  The patient is resting comfortably. No new complaints.  Objective   Vitals:  Vitals:   02/21/19 0933 02/21/19 1721  BP: (!) 193/74 (!) 191/76  Pulse: 71 77  Resp:  15  Temp: 98.2 F (36.8 C) 98.5 F (36.9 C)  SpO2: 98% 97%    Exam:  Constitutional:  . The patient is awake and alert. No acute distress. Respiratory:  . No increased work of breathing. . No wheezes, rales, or rhonchi . No tactile fremitus. Cardiovascular:  . Regular rate and rhythm. . No murmurs ectopy, or gallups. . No lateral PMI. No thrills. Abdomen:  . Abdomen is distended and tender. . No hernias, mass or organomegaly can be appreciated. Marland Kitchen Hypoactive bowel sounds. Musculoskeletal:  . No cyanosis, clubbing, or edema Skin:  . No rashes, lesions, ulcers . palpation of skin: no induration or nodules Neurologic:  . CN 2-12 intact . Sensation all 4 extremities intact Psychiatric: The patient is unable to participate in examination.  I have personally reviewed the following:   Today's Data  . Vitals, CBC's, and CMP Micro Data  . COVID-19 is negative  Imaging  . CT abdomen and pelvis demonstrated a high grade obstruction  Scheduled Meds: . carvedilol  18.75  mg Oral BID WC  . enoxaparin (LOVENOX) injection  30 mg Subcutaneous Q24H  . levothyroxine  88 mcg Oral Daily  . rosuvastatin  5 mg Oral q1800  . sodium chloride flush  3 mL Intravenous Once   Continuous Infusions: . sodium chloride 1,000 mL (02/21/19 8119)    Principal Problem:   SBO (small bowel obstruction) (HCC) Active Problems:   HTN (hypertension)   CKD (chronic kidney disease), stage III (HCC)   Chronic diastolic heart failure (HCC)   LOS: 0 days   A & P  Intractable nausea, vomiting, and abdominal pain: Pain control, antiemetics, and IV fluids.  High grade small bowel obstruction: Surgery Consulted and NGT placed. Patient in NPO. She is receiving IV fluids. Monitor. Fentanyl for pain as needed.  Chronic diastolic CHG: Lasix held. I's and O's will be monitored for signs of volume overload.  CKDIII: Avoid nephrotoxic agents and hypotension. Monitor electrol  Hypertension: Will continue Coreg down NGT. Will also add as needed IV hydralazine.  DVT prophylaxis: Lovenox Code Status: DNR Family Communication: None available Disposition Plan: TBD   Romy Mcgue, DO [facility] Direct contact: see [email or web address]  7PM-7AM contact night coverage as above 02/21/2019, 6:33 PM  LOS: 0 days

## 2019-02-21 NOTE — H&P (Signed)
History and Physical    Tina Weaver:[id number] DOB: April 05, 1919 DOA: 02/20/2019  PCP: Lajean Manes, MD  Patient coming from: Home  I have personally briefly reviewed patient's old medical records in [place]  Chief Complaint: abd pain  HPI: Tina Weaver is a 85 y.o. female with medical history significant of HTN, chronic diastolic CHF with hypertensive cardiomyopathy, AS.  Patient presents to the ED with c/o abd pain.  Symptoms onset 3 days ago.  Associated with nausea, CP, generalized weakness.  Last BM was before symptoms onset.    Poor PO intake.  No fever.  ED Course: CT abd/pelvis reveals high grade SBO.   Review of Systems: As per HPI, otherwise all review of systems negative.  Past Medical History:  Diagnosis Date   CHF (congestive heart failure) (HCC)    DOE (dyspnea on exertion) 09/11/2015   Hypertension     Past Surgical History:  Procedure Laterality Date   ABDOMINAL HYSTERECTOMY     APPENDECTOMY       reports that she has never smoked. She has never used smokeless tobacco. She reports that she does not drink alcohol. No history on file for drug.  Allergies  Allergen Reactions   Norvasc [Amlodipine] Swelling    Lower extremity edema with 10 mg dose, tolerates 5mg  without problems. Lower extremity edema with 10 mg dose, tolerates 5mg  without problems.   Alendronate Other (See Comments)    Pain to ext's   Fosamax [Alendronate Sodium] Other (See Comments)    "Pain to legs" per pt's family member    Family History  Problem Relation Age of Onset   Cancer Mother 49   Heart attack Father 72       HEART DISEASE   Stroke Father    Heart attack Brother    Cirrhosis Sister    Other Sister        HEALTHY   Other Sister        HEALTHY   Hyperlipidemia Son    Hyperlipidemia Son    Hypertension Sister      Prior to Admission medications   Medication Sig Start Date End Date Taking? Authorizing Provider  aspirin 81 MG  chewable tablet Chew 81 mg by mouth daily.   Yes [provider]  carvedilol (COREG) 12.5 MG tablet TAKE 1 AND 1/2 TABLETS BY MOUTH TWICE DAILY Patient taking differently: Take 18.75 mg by mouth 2 (two) times daily with a meal.  10/01/18  Yes Jettie Booze, MD  furosemide (LASIX) 20 MG tablet TAKE 1 TABLET BY MOUTH EVERY DAY Patient taking differently: Take 20 mg by mouth daily.  10/01/18  Yes Jettie Booze, MD  levothyroxine (SYNTHROID, LEVOTHROID) 88 MCG tablet Take 88 mcg by mouth daily. 03/21/16  Yes [provider]  potassium chloride SA (K-DUR,KLOR-CON) 20 MEQ tablet TAKE 1 TABLET BY MOUTH 2 TIMES A DAY Patient taking differently: Take 20 mEq by mouth daily.  04/13/18  Yes Jettie Booze, MD  rosuvastatin (CRESTOR) 5 MG tablet Take 1 tablet (5 mg total) by mouth daily at 6 PM. Please keep upcoming appt for future refills. Thank you 01/03/19  Yes Jettie Booze, MD    Physical Exam: Vitals:   02/21/19 0000 02/21/19 0030 02/21/19 0215 02/21/19 0245  BP: (!) 189/66 (!) 146/65 (!) 193/63 (!) 173/71  Pulse: 71 69 67 70  Resp: 16 16 (!) 22 19  Temp:      TempSrc:  SpO2: 91% 96% 97% 99%  Weight:      Height:        Constitutional: NAD, calm, comfortable Eyes: PERRL, lids and conjunctivae normal ENMT: Mucous membranes are moist. Posterior pharynx clear of any exudate or lesions.Normal dentition.  Neck: normal, supple, no masses, no thyromegaly Respiratory: clear to auscultation bilaterally, no wheezing, no crackles. Normal respiratory effort. No accessory muscle use.  Cardiovascular: Regular rate and rhythm, no murmurs / rubs / gallops. No extremity edema. 2+ pedal pulses. No carotid bruits.  Abdomen: Mild diffuse TTP, moderate distention, no rebound, no guarding Musculoskeletal: no clubbing / cyanosis. No joint deformity upper and lower extremities. Good ROM, no contractures. Normal muscle tone.  Skin: no rashes, lesions, ulcers. No  induration Neurologic: CN 2-12 grossly intact. Sensation intact, DTR normal. Strength 5/5 in all 4.  Psychiatric: Normal judgment and insight. Alert and oriented x 3. Normal mood.    Labs on Admission: I have personally reviewed following labs and imaging studies  CBC: Recent Labs  Lab 02/20/19 2253  WBC 13.9*  HGB 15.0  HCT 44.0  MCV 97.1  PLT 270   Basic Metabolic Panel: Recent Labs  Lab 02/20/19 2253  NA 135  K 4.5  CL 101  CO2 22  GLUCOSE 152*  BUN 24*  CREATININE 1.08*  CALCIUM 9.1   GFR: Estimated Creatinine Clearance: 20.9 mL/min (A) (by C-G formula based on SCr of 1.08 mg/dL (H)). Liver Function Tests: Recent Labs  Lab 02/20/19 2253  AST 23  ALT 13  ALKPHOS 81  BILITOT 0.6  PROT 7.2  ALBUMIN 3.7   Recent Labs  Lab 02/20/19 2253  LIPASE 34   No results for input(s): AMMONIA in the last 168 hours. Coagulation Profile: No results for input(s): INR, PROTIME in the last 168 hours. Cardiac Enzymes: No results for input(s): CKTOTAL, CKMB, CKMBINDEX, TROPONINI in the last 168 hours. BNP (last 3 results) No results for input(s): PROBNP in the last 8760 hours. HbA1C: No results for input(s): HGBA1C in the last 72 hours. CBG: No results for input(s): GLUCAP in the last 168 hours. Lipid Profile: No results for input(s): CHOL, HDL, LDLCALC, TRIG, CHOLHDL, LDLDIRECT in the last 72 hours. Thyroid Function Tests: No results for input(s): TSH, T4TOTAL, FREET4, T3FREE, THYROIDAB in the last 72 hours. Anemia Panel: No results for input(s): VITAMINB12, FOLATE, FERRITIN, TIBC, IRON, RETICCTPCT in the last 72 hours. Urine analysis:    Component Value Date/Time   COLORURINE YELLOW 05/15/2015 1745   APPEARANCEUR CLEAR 05/15/2015 1745   LABSPEC 1.011 05/15/2015 1745   PHURINE 6.5 05/15/2015 1745   GLUCOSEU NEGATIVE 05/15/2015 1745   HGBUR NEGATIVE 05/15/2015 1745   BILIRUBINUR NEGATIVE 05/15/2015 1745   KETONESUR NEGATIVE 05/15/2015 1745   PROTEINUR NEGATIVE  05/15/2015 1745   UROBILINOGEN 0.2 05/15/2015 1745   NITRITE NEGATIVE 05/15/2015 1745   LEUKOCYTESUR NEGATIVE 05/15/2015 1745    Radiological Exams on Admission: Ct Abdomen Pelvis W Contrast  Result Date: 02/21/2019 CLINICAL DATA:  Nausea, abdominal pain, weakness EXAM: CT ABDOMEN AND PELVIS WITH CONTRAST TECHNIQUE: Multidetector CT imaging of the abdomen and pelvis was performed using the standard protocol following bolus administration of intravenous contrast. CONTRAST:  17mL OMNIPAQUE IOHEXOL 300 MG/ML  SOLN COMPARISON:  01/30/2016 FINDINGS: Lower chest: Trace right pleural effusion. Hepatobiliary: Liver is within normal limits. Gallbladder is unremarkable. No intrahepatic or extrahepatic ductal dilatation. Pancreas: Within normal limits. Spleen: Within normal limits. Adrenals/Urinary Tract: Adrenal glands are within normal limits. Kidneys are within normal limits.  No hydronephrosis. Bladder is within normal limits. Stomach/Bowel: Stomach is within normal limits. Multiple dilated loops of small bowel in the lower abdomen, with abrupt transition point in the posterior mid abdomen (series 3/image 45; coronal image 54). Decompressed loops of ileum in the right mid/lower abdomen (series 3/image 38). This appearance is compatible with high-grade partial or complete small bowel obstruction. Appendix is not discretely visualized. Left colon is decompressed. Vascular/Lymphatic: No evidence of abdominal aortic aneurysm. Atherosclerotic calcifications of the abdominal aorta and branch vessels. No suspicious abdominopelvic lymphadenopathy. Reproductive: Status post hysterectomy. Bilateral ovaries are unremarkable. Other: Small volume abdominopelvic ascites. No free air. Musculoskeletal: Degenerative changes of the visualized thoracolumbar spine. IMPRESSION: High-grade partial or complete small bowel obstruction, with transition point in the posterior mid abdomen, as above. Small volume abdominopelvic ascites.  No  free air. Trace right pleural effusion. Electronically Signed   By: Julian Hy M.D.   On: 02/21/2019 02:03    EKG: Independently reviewed.  Assessment/Plan Principal Problem:   SBO (small bowel obstruction) (HCC) Active Problems:   HTN (hypertension)   CKD (chronic kidney disease), stage III (HCC)   Chronic diastolic heart failure ([place])    1. SBO - 1. Gen surg consulted 2. NPO 3. IVF: 500 cc NS bolus then 75 cc/hr 4. NGT 5. Fentanyl PRN pain 2. Chronic diastolic CHF - 1. Hold lasix 2. Strict intake and output 3. Watch for signs of fluid overload with IVF 3. CKD stage 3 - chronic and baseline 4. HTN - 1. Continue home Coreg 2. Adding PRN hydralazine  DVT prophylaxis: Lovenox Code Status: Full Family Communication: No family in room Disposition Plan: Home after admit Consults called: Gen surg Admission status: Admit to inpatient  Severity of Illness: The appropriate patient status for this patient is INPATIENT. Inpatient status is judged to be reasonable and necessary in order to provide the required intensity of service to ensure the patient's safety. The patient's presenting symptoms, physical exam findings, and initial radiographic and laboratory data in the context of their chronic comorbidities is felt to place them at high risk for further clinical deterioration. Furthermore, it is not anticipated that the patient will be medically stable for discharge from the hospital within 2 midnights of admission. The following factors support the patient status of inpatient.   IP status for treatment of SBO.   * I certify that at the point of admission it is my clinical judgment that the patient will require inpatient hospital care spanning beyond 2 midnights from the point of admission due to high intensity of service, high risk for further deterioration and high frequency of surveillance required.*    Vinny Taranto M. DO [facility]  How to contact the [facility]  Attending or Consulting provider [place] or covering provider during after hours [place], for this patient?  1. Check the care team in [facility] and look for a) attending/consulting [facility] provider listed and b) the [facility] team listed 2. Log into [email or web address]  Amion Physician Scheduling and messaging for groups and whole hospitals  On call and physician scheduling software for group practices, residents, hospitalists and other medical providers for call, clinic, rotation and shift schedules. On[employer] is a hospital-wide system for scheduling doctors and paging doctors on call. EasyPlot is for scientific plotting and data analysis.  [email or web address]  and use [place]'s universal password to access. If you do not have the password, please contact the hospital operator.  3. Locate the [facility] provider you are looking for  under [facility] and page to a number that you can be directly reached. 4. If you still have difficulty reaching the provider, please page the [facility] (Director on Call) for the Hospitalists listed on amion for assistance.  02/21/2019, 3:43 AM

## 2019-02-21 NOTE — Progress Notes (Signed)
Subjective: CC: SBO Patient reports that she has ongoing generalized abdominal pain, distension and nausea. Has not passed flatus or had a BM. Had NG tube placed by Fluoro this AM.   Objective: Vital signs in last 24 hours: Temp:  [97.6 F (36.4 C)-98.2 F (36.8 C)] 98.2 F (36.8 C) (07/27 0933) Pulse Rate:  [66-72] 71 (07/27 0933) Resp:  [14-24] 16 (07/27 0634) BP: (146-204)/(63-84) 193/74 (07/27 0933) SpO2:  [91 %-99 %] 98 % (07/27 0933) Weight:  [50.8 kg] 50.8 kg (07/26 2241)    Intake/Output from previous day: 07/26 0701 - 07/27 0700 In: 500 [IV Piggyback:500] Out: 450 [Urine:450] Intake/Output this shift: No intake/output data recorded.  PE: Gen:  Alert, NAD, pleasant Pulm: Normal rate and effort normal Abd: Distended but soft. Generalized tenderness without peritonitis. Hypoactive bowel sounds. NG tube in place. ~100cc output in cannister Ext:  No LE edema  Lab Results:  Recent Labs    02/20/19 2253  WBC 13.9*  HGB 15.0  HCT 44.0  PLT 253   BMET Recent Labs    02/20/19 2253 02/21/19 0342  NA 135 137  K 4.5 4.4  CL 101 103  CO2 22 22  GLUCOSE 152* 129*  BUN 24* 21  CREATININE 1.08* 1.00  CALCIUM 9.1 8.8*   PT/INR No results for input(s): LABPROT, INR in the last 72 hours. CMP     Component Value Date/Time   NA 137 02/21/2019 0342   K 4.4 02/21/2019 0342   CL 103 02/21/2019 0342   CO2 22 02/21/2019 0342   GLUCOSE 129 (H) 02/21/2019 0342   BUN 21 02/21/2019 0342   CREATININE 1.00 02/21/2019 0342   CREATININE 1.00 (H) 05/02/2016 0830   CALCIUM 8.8 (L) 02/21/2019 0342   PROT 7.2 02/20/2019 2253   ALBUMIN 3.7 02/20/2019 2253   AST 23 02/20/2019 2253   ALT 13 02/20/2019 2253   ALKPHOS 81 02/20/2019 2253   BILITOT 0.6 02/20/2019 2253   GFRNONAA 47 (L) 02/21/2019 0342   GFRAA 54 (L) 02/21/2019 0342   Lipase     Component Value Date/Time   LIPASE 34 02/20/2019 2253       Studies/Results: Dg Abd 1 View  Result Date:  02/21/2019 CLINICAL DATA:  10 FR NASO GASTRIC SUMP TUBE PLACED UNDER FLUORO AFTER MULTIPLE ATTEMPTS ON FLOOR(NO RAD) 3 MIN 36 SEC FLUORO EXAM: ABDOMEN - 1 VIEW COMPARISON:  CT of the abdomen and pelvis on 02/21/2019 FINDINGS: Nasogastric tube has been placed, tip overlying the level of the distal stomach. Persistent dilatation of small bowel loops. No free intraperitoneal air. IMPRESSION: Nasogastric tube tip to level of distal stomach. Persistent dilatation of small bowel loops. Electronically Signed   By: Nolon Nations M.D.   On: 02/21/2019 09:35   Ct Abdomen Pelvis W Contrast  Result Date: 02/21/2019 CLINICAL DATA:  Nausea, abdominal pain, weakness EXAM: CT ABDOMEN AND PELVIS WITH CONTRAST TECHNIQUE: Multidetector CT imaging of the abdomen and pelvis was performed using the standard protocol following bolus administration of intravenous contrast. CONTRAST:  38mL OMNIPAQUE IOHEXOL 300 MG/ML  SOLN COMPARISON:  01/30/2016 FINDINGS: Lower chest: Trace right pleural effusion. Hepatobiliary: Liver is within normal limits. Gallbladder is unremarkable. No intrahepatic or extrahepatic ductal dilatation. Pancreas: Within normal limits. Spleen: Within normal limits. Adrenals/Urinary Tract: Adrenal glands are within normal limits. Kidneys are within normal limits.  No hydronephrosis. Bladder is within normal limits. Stomach/Bowel: Stomach is within normal limits. Multiple dilated loops of small bowel in the  lower abdomen, with abrupt transition point in the posterior mid abdomen (series 3/image 45; coronal image 54). Decompressed loops of ileum in the right mid/lower abdomen (series 3/image 38). This appearance is compatible with high-grade partial or complete small bowel obstruction. Appendix is not discretely visualized. Left colon is decompressed. Vascular/Lymphatic: No evidence of abdominal aortic aneurysm. Atherosclerotic calcifications of the abdominal aorta and branch vessels. No suspicious abdominopelvic  lymphadenopathy. Reproductive: Status post hysterectomy. Bilateral ovaries are unremarkable. Other: Small volume abdominopelvic ascites. No free air. Musculoskeletal: Degenerative changes of the visualized thoracolumbar spine. IMPRESSION: High-grade partial or complete small bowel obstruction, with transition point in the posterior mid abdomen, as above. Small volume abdominopelvic ascites.  No free air. Trace right pleural effusion. Electronically Signed   By: Julian Hy M.D.   On: 02/21/2019 02:03    Anti-infectives: Anti-infectives (From admission, onward)   None       Assessment/Plan HTN CHF  SBO - Hx appendectomy and abdominal hysterctomy  - NGT placed by Fluoro this AM - SBO protocol  FEN - NPO VTE - SCD, Lovenox ID - None    LOS: 0 days    [name] , [facility] Surgery 02/21/2019, 11:09 AM Pager: [phone]

## 2019-02-21 NOTE — ED Notes (Signed)
Attempted to insert NG tubs x 2 w/o success.

## 2019-02-22 ENCOUNTER — Inpatient Hospital Stay (HOSPITAL_COMMUNITY): Payer: Medicare HMO

## 2019-02-22 LAB — BASIC METABOLIC PANEL
Anion gap: 11 (ref 5–15)
BUN: 23 mg/dL (ref 8–23)
CO2: 22 mmol/L (ref 22–32)
Calcium: 8.5 mg/dL — ABNORMAL LOW (ref 8.9–10.3)
Chloride: 111 mmol/L (ref 98–111)
Creatinine, Ser: 1.06 mg/dL — ABNORMAL HIGH (ref 0.44–1.00)
GFR calc Af Amer: 51 mL/min — ABNORMAL LOW (ref >60)
GFR calc non Af Amer: 44 mL/min — ABNORMAL LOW (ref >60)
Glucose, Bld: 100 mg/dL — ABNORMAL HIGH (ref 70–99)
Potassium: 3.7 mmol/L (ref 3.5–5.1)
Sodium: 144 mmol/L (ref 135–145)

## 2019-02-22 LAB — CBC WITH DIFFERENTIAL/PLATELET
Abs Immature Granulocytes: 0.04 10*3/uL (ref 0.00–0.07)
Basophils Absolute: 0 10*3/uL (ref 0.0–0.1)
Basophils Relative: 0 %
Eosinophils Absolute: 0 10*3/uL (ref 0.0–0.5)
Eosinophils Relative: 0 %
HCT: 39.5 % (ref 36.0–46.0)
Hemoglobin: 13 g/dL (ref 12.0–15.0)
Immature Granulocytes: 0 %
Lymphocytes Relative: 13 %
Lymphs Abs: 1.3 10*3/uL (ref 0.7–4.0)
MCH: 32.8 pg (ref 26.0–34.0)
MCHC: 32.9 g/dL (ref 30.0–36.0)
MCV: 99.7 fL (ref 80.0–100.0)
Monocytes Absolute: 1.1 10*3/uL — ABNORMAL HIGH (ref 0.1–1.0)
Monocytes Relative: 11 %
Neutro Abs: 7.4 10*3/uL (ref 1.7–7.7)
Neutrophils Relative %: 76 %
Platelets: 211 10*3/uL (ref 150–400)
RBC: 3.96 MIL/uL (ref 3.87–5.11)
RDW: 14.4 % (ref 11.5–15.5)
WBC: 9.8 10*3/uL (ref 4.0–10.5)
nRBC: 0 % (ref 0.0–0.2)

## 2019-02-22 NOTE — Progress Notes (Signed)
Subjective: CC: Abdominal pain Patient reports increased abdominal pain after receiving gastrografin through the NG tube yesterday. Pain has settled this morning. Still has distension and nausea. NG tube not currently hooked to LIWS. 1000cc recoreded overnight. 350cc in cannister currently. Films this AM with persistent SBO.   Objective: Vital signs in last 24 hours: Temp:  [98.4 F (36.9 C)-98.7 F (37.1 C)] 98.7 F (37.1 C) (07/28 1018) Pulse Rate:  [63-77] 63 (07/28 1018) Resp:  [15-20] 20 (07/28 1018) BP: (160-195)/(55-76) 167/56 (07/28 1018) SpO2:  [96 %-97 %] 96 % (07/28 1018) Weight:  [50.5 kg] 50.5 kg (07/27 2130)    Intake/Output from previous day: 07/27 0701 - 07/28 0700 In: 1746.8 [I.V.:1736.8; NG/GT:10] Out: 1000 [Emesis/NG output:1000] Intake/Output this shift: No intake/output data recorded.  PE: Gen:  Alert, NAD, pleasant Pulm: Normal rate and effort normal Abd: Distended but soft. Generalized tenderness without peritonitis. Hypoactive bowel sounds. NG tube in place and rehooked to [facility] with return. ~350cc bile in cannister. 1000cc recorded/24 hours.  Ext:  No LE edema  Lab Results:  Recent Labs    02/20/19 2253 02/22/19 0722  WBC 13.9* 9.8  HGB 15.0 13.0  HCT 44.0 39.5  PLT 253 211   BMET Recent Labs    02/21/19 0342 02/22/19 0722  NA 137 144  K 4.4 3.7  CL 103 111  CO2 22 22  GLUCOSE 129* 100*  BUN 21 23  CREATININE 1.00 1.06*  CALCIUM 8.8* 8.5*   PT/INR No results for input(s): LABPROT, INR in the last 72 hours. CMP     Component Value Date/Time   NA 144 02/22/2019 0722   K 3.7 02/22/2019 0722   CL 111 02/22/2019 0722   CO2 22 02/22/2019 0722   GLUCOSE 100 (H) 02/22/2019 0722   BUN 23 02/22/2019 0722   CREATININE 1.06 (H) 02/22/2019 0722   CREATININE 1.00 (H) 05/02/2016 0830   CALCIUM 8.5 (L) 02/22/2019 0722   PROT 7.2 02/20/2019 2253   ALBUMIN 3.7 02/20/2019 2253   AST 23 02/20/2019 2253   ALT 13 02/20/2019 2253   ALKPHOS 81 02/20/2019 2253   BILITOT 0.6 02/20/2019 2253   GFRNONAA 44 (L) 02/22/2019 0722   GFRAA 51 (L) 02/22/2019 0722   Lipase     Component Value Date/Time   LIPASE 34 02/20/2019 2253       Studies/Results: Dg Abd 1 View  Result Date: 02/21/2019 CLINICAL DATA:  10 FR NASO GASTRIC SUMP TUBE PLACED UNDER FLUORO AFTER MULTIPLE ATTEMPTS ON FLOOR(NO RAD) 3 MIN 36 SEC FLUORO EXAM: ABDOMEN - 1 VIEW COMPARISON:  CT of the abdomen and pelvis on 02/21/2019 FINDINGS: Nasogastric tube has been placed, tip overlying the level of the distal stomach. Persistent dilatation of small bowel loops. No free intraperitoneal air. IMPRESSION: Nasogastric tube tip to level of distal stomach. Persistent dilatation of small bowel loops. Electronically Signed   By: Nolon Nations M.D.   On: 02/21/2019 09:35   Ct Abdomen Pelvis W Contrast  Result Date: 02/21/2019 CLINICAL DATA:  Nausea, abdominal pain, weakness EXAM: CT ABDOMEN AND PELVIS WITH CONTRAST TECHNIQUE: Multidetector CT imaging of the abdomen and pelvis was performed using the standard protocol following bolus administration of intravenous contrast. CONTRAST:  44mL OMNIPAQUE IOHEXOL 300 MG/ML  SOLN COMPARISON:  01/30/2016 FINDINGS: Lower chest: Trace right pleural effusion. Hepatobiliary: Liver is within normal limits. Gallbladder is unremarkable. No intrahepatic or extrahepatic ductal dilatation. Pancreas: Within normal limits. Spleen: Within normal limits. Adrenals/Urinary Tract:  Adrenal glands are within normal limits. Kidneys are within normal limits.  No hydronephrosis. Bladder is within normal limits. Stomach/Bowel: Stomach is within normal limits. Multiple dilated loops of small bowel in the lower abdomen, with abrupt transition point in the posterior mid abdomen (series 3/image 45; coronal image 54). Decompressed loops of ileum in the right mid/lower abdomen (series 3/image 38). This appearance is compatible with high-grade partial or complete small  bowel obstruction. Appendix is not discretely visualized. Left colon is decompressed. Vascular/Lymphatic: No evidence of abdominal aortic aneurysm. Atherosclerotic calcifications of the abdominal aorta and branch vessels. No suspicious abdominopelvic lymphadenopathy. Reproductive: Status post hysterectomy. Bilateral ovaries are unremarkable. Other: Small volume abdominopelvic ascites. No free air. Musculoskeletal: Degenerative changes of the visualized thoracolumbar spine. IMPRESSION: High-grade partial or complete small bowel obstruction, with transition point in the posterior mid abdomen, as above. Small volume abdominopelvic ascites.  No free air. Trace right pleural effusion. Electronically Signed   By: Julian Hy M.D.   On: 02/21/2019 02:03   Dg Abd Portable 1v-small Bowel Obstruction Protocol-initial, 8 Hr Delay  Result Date: 02/21/2019 CLINICAL DATA:  Small-bowel obstruction, 8 hour delay EXAM: PORTABLE ABDOMEN - 1 VIEW COMPARISON:  02/21/2019 FINDINGS: Esophageal tube tip overlies the gastroduodenal junction. Dilute contrast present within persistently dilated small bowel. Small bowel distension up to 4.2 cm. No definite contrast within the colon. Excreted contrast within the urinary bladder. IMPRESSION: Dilute contrast present within persistently dilated small bowel, consistent with bowel obstruction. No definitive contrast observed within the colon. Electronically Signed   By: Donavan Foil M.D.   On: 02/21/2019 21:07    Anti-infectives: Anti-infectives (From admission, onward)   None       Assessment/Plan HTN CHF  SBO - Hx appendectomy and abdominal hysterctomy  - SBO protocol. Films this AM with persistent dilated small bowel c/w SBO - Keep Mg > 2 and K > 4 for bowel function - Mobilize as able for bowel function.   FEN - NPO VTE - SCD, Lovenox ID - None  POC - Kailly Richoux (Son) [phone]   LOS: 1 day    [name] , [facility] Surgery  02/22/2019, 11:00 AM Pager: [phone]

## 2019-02-22 NOTE — Progress Notes (Signed)
PROGRESS NOTE  Tina Weaver PNT:[id number] DOB: Jan 02, 1919 DOA: 02/20/2019 PCP: Lajean Manes, MD  Brief History   Tina Weaver is a 85 y.o. female with medical history significant of HTN, chronic diastolic CHF with hypertensive cardiomyopathy, AS.  Patient presents to the ED with c/o abd pain.  Symptoms onset 3 days ago.  Associated with nausea, CP, generalized weakness.  Last BM was before symptoms onset.    Poor PO intake.  No fever. Vital signs are stable. CT of the abdomen and pelvis demonstrated a high grade SBO. General surgery was consulted and NGT has been placed. She is NPO.  Consultants  . General Surgery  Procedures  . NGT placement  Antibiotics   Anti-infectives (From admission, onward)   None     Subjective  The patient is resting comfortably. No new complaints.  Objective   Vitals:  Vitals:   02/22/19 0634 02/22/19 1018  BP: (!) 160/63 (!) 167/56  Pulse: 75 63  Resp:  20  Temp:  98.7 F (37.1 C)  SpO2:  96%    Exam:  Constitutional:  . The patient is awake and alert. No acute distress. Respiratory:  . No increased work of breathing. . No wheezes, rales, or rhonchi . No tactile fremitus. Cardiovascular:  . Regular rate and rhythm. . No murmurs ectopy, or gallups. . No lateral PMI. No thrills. Abdomen:  . Abdomen is distended and tender. . No hernias, mass or organomegaly can be appreciated. Marland Kitchen Hypoactive bowel sounds. Musculoskeletal:  . No cyanosis, clubbing, or edema Skin:  . No rashes, lesions, ulcers . palpation of skin: no induration or nodules Neurologic:  . CN 2-12 intact . Sensation all 4 extremities intact Psychiatric: The patient is unable to participate in examination.  I have personally reviewed the following:   Today's Data  . Vitals, CBC's, and CMP Micro Data  . COVID-19 is negative  Imaging  . CT abdomen and pelvis demonstrated a high grade obstruction  Scheduled Meds: . carvedilol  18.75 mg Oral BID WC  .  enoxaparin (LOVENOX) injection  30 mg Subcutaneous Q24H  . levothyroxine  88 mcg Oral Daily  . rosuvastatin  5 mg Oral q1800  . sodium chloride flush  3 mL Intravenous Once   Continuous Infusions: . sodium chloride 1,000 mL (02/22/19 1115)    Principal Problem:   SBO (small bowel obstruction) (HCC) Active Problems:   HTN (hypertension)   CKD (chronic kidney disease), stage III (HCC)   Chronic diastolic heart failure ([place])   LOS: 1 day   A & P  Intractable nausea, vomiting, and abdominal pain: Pain control, antiemetics, and IV fluids. NGT in place and patient is NPO. 1000 cc out over 24 hours.  High grade small bowel obstruction: Surgery Consulted and NGT placed. Patient in NPO. She is receiving IV fluids. Monitor. Fentanyl for pain as needed. Will mobilize and maintain a K above 4 and magnesium greater than 2 for bowel function.  Chronic diastolic CHG: Lasix held. I's and O's will be monitored for signs of volume overload.  CKDIII: Avoid nephrotoxic agents and hypotension. Monitor electrol  Hypertension: Will continue Coreg down NGT. Will also add as needed IV hydralazine.  I have seen and examined this patient myself. I have spent 32 minutes in her evaluation and care.  DVT prophylaxis: Lovenox Code Status: DNR Family Communication: None available Disposition Plan: TBD   Earma Nicolaou, DO [facility] Direct contact: see [email or web address]  7PM-7AM contact night coverage as above 02/22/2019,  3:04 PM  LOS: 0 days

## 2019-02-23 ENCOUNTER — Inpatient Hospital Stay (HOSPITAL_COMMUNITY): Payer: Medicare HMO

## 2019-02-23 LAB — BASIC METABOLIC PANEL
Anion gap: 15 (ref 5–15)
BUN: 34 mg/dL — ABNORMAL HIGH (ref 8–23)
CO2: 22 mmol/L (ref 22–32)
Calcium: 8.4 mg/dL — ABNORMAL LOW (ref 8.9–10.3)
Chloride: 112 mmol/L — ABNORMAL HIGH (ref 98–111)
Creatinine, Ser: 1.26 mg/dL — ABNORMAL HIGH (ref 0.44–1.00)
GFR calc Af Amer: 41 mL/min — ABNORMAL LOW (ref >60)
GFR calc non Af Amer: 35 mL/min — ABNORMAL LOW (ref >60)
Glucose, Bld: 114 mg/dL — ABNORMAL HIGH (ref 70–99)
Potassium: 3.4 mmol/L — ABNORMAL LOW (ref 3.5–5.1)
Sodium: 149 mmol/L — ABNORMAL HIGH (ref 135–145)

## 2019-02-23 LAB — MAGNESIUM: Magnesium: 2.3 mg/dL (ref 1.7–2.4)

## 2019-02-23 MED ORDER — POTASSIUM CHLORIDE 10 MEQ/100ML IV SOLN
10.0000 meq | INTRAVENOUS | Status: AC
  Administered 2019-02-23 (×4): 10 meq via INTRAVENOUS
  Filled 2019-02-23 (×3): qty 100

## 2019-02-23 MED ORDER — MENTHOL 3 MG MT LOZG
1.0000 | LOZENGE | OROMUCOSAL | Status: DC | PRN
  Administered 2019-02-23: 3 mg via ORAL
  Filled 2019-02-23: qty 9

## 2019-02-23 MED ORDER — PHENOL 1.4 % MT LIQD
1.0000 | OROMUCOSAL | Status: DC | PRN
  Administered 2019-02-23: 1 via OROMUCOSAL
  Filled 2019-02-23: qty 177

## 2019-02-23 MED ORDER — SODIUM CHLORIDE 0.9 % IV SOLN
INTRAVENOUS | Status: DC | PRN
  Administered 2019-02-23: 250 mL via INTRAVENOUS
  Administered 2019-03-06: 1000 mL via INTRAVENOUS

## 2019-02-23 NOTE — Progress Notes (Signed)
OT Cancellation Note  Patient Details Name: Tina Weaver MRN: [id number] DOB: April 09, 1919   Cancelled Treatment:    Reason Eval/Treat Not Completed: Patient declined, no reason specified(Pt stating that she had just been up and wanted to rest.)  OT to follow-up as needed for OT evaluation.  Ebony Hail Harold Hedge) Marsa Aris OTR/L Acute Rehabilitation Services Pager: [phone] Office: [place] 02/23/2019, 3:44 PM

## 2019-02-23 NOTE — Progress Notes (Addendum)
PROGRESS NOTE  ISLAM VILLESCAS XKG:[id number] DOB: 07-05-1919 DOA: 02/20/2019 PCP: Lajean Manes, MD  Brief History   Tina Weaver is a 85 y.o. female with medical history significant of HTN, chronic diastolic CHF with hypertensive cardiomyopathy, AS.   Patient presents to the ED with c/o abd pain.  Symptoms onset 3 days ago.  Associated with nausea, CP, generalized weakness.  Last BM was before symptoms onset.    Poor PO intake.   No fever. Vital signs are stable. CT of the abdomen and pelvis demonstrated a high grade SBO. General surgery was consulted and NGT has been placed. She is NPO except for sips of water with meds.  Consultants  General Surgery  Procedures  NGT placement  Antibiotics   Anti-infectives (From admission, onward)    None      Subjective  The patient is resting comfortably. No new complaints.  Objective   Vitals:  Vitals:   02/23/19 0446 02/23/19 0900  BP: (!) 169/61 (!) 145/77  Pulse: 75 74  Resp: (!) 21   Temp: 98.2 F (36.8 C) 98.2 F (36.8 C)  SpO2: 92% 95%    Exam:  Constitutional:  The patient is awake and alert. No acute distress. Respiratory:  No increased work of breathing. No wheezes, rales, or rhonchi No tactile fremitus. Cardiovascular:  Regular rate and rhythm. No murmurs ectopy, or gallups. No lateral PMI. No thrills. Abdomen:  Abdomen is distended and tender. No hernias, mass or organomegaly can be appreciated. Hypoactive bowel sounds. Musculoskeletal:  No cyanosis, clubbing, or edema Skin:  No rashes, lesions, ulcers palpation of skin: no induration or nodules Neurologic:  CN 2-12 intact Sensation all 4 extremities intact Psychiatric: The patient is unable to participate in examination.  I have personally reviewed the following:   Today's Data  Vitals, CBC's, and CMP Micro Data  COVID-19 is negative  Imaging  CT abdomen and pelvis demonstrated a high grade obstruction  Scheduled Meds:  carvedilol  18.75  mg Oral BID WC   enoxaparin (LOVENOX) injection  30 mg Subcutaneous Q24H   levothyroxine  88 mcg Oral Daily   rosuvastatin  5 mg Oral q1800   sodium chloride flush  3 mL Intravenous Once   Continuous Infusions:  sodium chloride 1,000 mL (02/23/19 0040)   sodium chloride 250 mL (02/23/19 0955)    Principal Problem:   SBO (small bowel obstruction) (HCC) Active Problems:   HTN (hypertension)   CKD (chronic kidney disease), stage III (HCC)   Chronic diastolic heart failure ([place])   LOS: 2 days   A & P  Intractable nausea, vomiting, and abdominal pain: Pain control, antiemetics, and IV fluids. NGT in place and patient is NPO.   High grade small bowel obstruction: Surgery Consulted and NGT placed. Patient in NPO. She is receiving IV fluids. Monitor. Fentanyl for pain as needed. Will mobilize and maintain a K above 4 and magnesium greater than 2 for bowel function.Per surgery the patient is not any worse, but she is not improving. Continue conservative management for now.  Chronic diastolic CHG: Lasix held. I's and O's will be monitored for signs of volume overload.  Hypokalemia: Supplemented. Monitor.  CKDIII: Avoid nephrotoxic agents and hypotension. Monitor electrol  Hypertension: Will continue Coreg down NGT. Will also add as needed IV hydralazine.  I have seen and examined this patient myself. I have spent 34 minutes in her evaluation and care.  DVT prophylaxis: Lovenox Code Status: DNR Family Communication: None available Disposition Plan: TBD  Aurie Harroun, DO [facility] Direct contact: see [email or web address]  7PM-7AM contact night coverage as above 02/23/2019, 3:04 PM  LOS: 0 days

## 2019-02-23 NOTE — Progress Notes (Signed)
Subjective: CC: Abdominal Distension Patient reports that she feels better today. Less abdominal pain overnight and this morning. Still feels distended. No current nausea. No flatus or BM, but has the sensation she has to have a BM. About to try and use the bedside commode. Complains of soreness from NGT. 800cc of dark bilious like output in NG tube cannister from overnight  Objective: Vital signs in last 24 hours: Temp:  [98.1 F (36.7 C)-98.7 F (37.1 C)] 98.2 F (36.8 C) (07/29 0900) Pulse Rate:  [63-117] 74 (07/29 0900) Resp:  [18-21] 21 (07/29 0446) BP: (131-169)/(56-97) 145/77 (07/29 0900) SpO2:  [92 %-96 %] 95 % (07/29 0900) Weight:  [50.5 kg] 50.5 kg (07/28 2106)    Intake/Output from previous day: 07/28 0701 - 07/29 0700 In: 0  Out: 1450 [Urine:650; Emesis/NG output:800] Intake/Output this shift: No intake/output data recorded.  PE: Gen: Alert, NAD, pleasant Pulm:Normal rate andeffort normal AJO:INOMVEHMC but soft. Generalized tenderness without peritonitis. Hypoactive bowel sounds. NG tube in place and rehooked to [facility] with return. ~800cc bile in cannister.  Ext: No LEedema  Lab Results:  Recent Labs    02/20/19 2253 02/22/19 0722  WBC 13.9* 9.8  HGB 15.0 13.0  HCT 44.0 39.5  PLT 253 211   BMET Recent Labs    02/22/19 0722 02/23/19 0436  NA 144 149*  K 3.7 3.4*  CL 111 112*  CO2 22 22  GLUCOSE 100* 114*  BUN 23 34*  CREATININE 1.06* 1.26*  CALCIUM 8.5* 8.4*   PT/INR No results for input(s): LABPROT, INR in the last 72 hours. CMP     Component Value Date/Time   NA 149 (H) 02/23/2019 0436   K 3.4 (L) 02/23/2019 0436   CL 112 (H) 02/23/2019 0436   CO2 22 02/23/2019 0436   GLUCOSE 114 (H) 02/23/2019 0436   BUN 34 (H) 02/23/2019 0436   CREATININE 1.26 (H) 02/23/2019 0436   CREATININE 1.00 (H) 05/02/2016 0830   CALCIUM 8.4 (L) 02/23/2019 0436   PROT 7.2 02/20/2019 2253   ALBUMIN 3.7 02/20/2019 2253   AST 23 02/20/2019 2253   ALT 13 02/20/2019 2253   ALKPHOS 81 02/20/2019 2253   BILITOT 0.6 02/20/2019 2253   GFRNONAA 35 (L) 02/23/2019 0436   GFRAA 41 (L) 02/23/2019 0436   Lipase     Component Value Date/Time   LIPASE 34 02/20/2019 2253       Studies/Results: Dg Abd 1 View  Result Date: 02/21/2019 CLINICAL DATA:  10 FR NASO GASTRIC SUMP TUBE PLACED UNDER FLUORO AFTER MULTIPLE ATTEMPTS ON FLOOR(NO RAD) 3 MIN 36 SEC FLUORO EXAM: ABDOMEN - 1 VIEW COMPARISON:  CT of the abdomen and pelvis on 02/21/2019 FINDINGS: Nasogastric tube has been placed, tip overlying the level of the distal stomach. Persistent dilatation of small bowel loops. No free intraperitoneal air. IMPRESSION: Nasogastric tube tip to level of distal stomach. Persistent dilatation of small bowel loops. Electronically Signed   By: Nolon Nations M.D.   On: 02/21/2019 09:35   Dg Abd Portable 1v-small Bowel Obstruction Protocol-24 Hr Delay  Result Date: 02/22/2019 CLINICAL DATA:  Small bowel obstruction EXAM: PORTABLE ABDOMEN - 1 VIEW COMPARISON:  02/21/2019 FINDINGS: There is persistent dilated loops of small bowel in the abdomen to 4.6 cm. There is faint enteric contrast in the lower abdomen which may be within a dilated small bowel loop. There is a nasogastric tube with the tip projecting over the antrum of the stomach. There is  no evidence of pneumoperitoneum, portal venous gas or pneumatosis. There are no pathologic calcifications along the expected course of the ureters. The osseous structures are unremarkable. IMPRESSION: 1. Persistent dilated loops of small bowel in the abdomen consistent with small bowel obstruction. Electronically Signed   By: Kathreen Devoid   On: 02/22/2019 14:20   Dg Abd Portable 1v-small Bowel Obstruction Protocol-initial, 8 Hr Delay  Result Date: 02/21/2019 CLINICAL DATA:  Small-bowel obstruction, 8 hour delay EXAM: PORTABLE ABDOMEN - 1 VIEW COMPARISON:  02/21/2019 FINDINGS: Esophageal tube tip overlies the gastroduodenal  junction. Dilute contrast present within persistently dilated small bowel. Small bowel distension up to 4.2 cm. No definite contrast within the colon. Excreted contrast within the urinary bladder. IMPRESSION: Dilute contrast present within persistently dilated small bowel, consistent with bowel obstruction. No definitive contrast observed within the colon. Electronically Signed   By: Donavan Foil M.D.   On: 02/21/2019 21:07    Anti-infectives: Anti-infectives (From admission, onward)   None       Assessment/Plan HTN CHF  SBO - Hx appendectomy and abdominal hysterctomy  - SBO protocol. Films this AM with persistent dilated small bowel c/w SBO - Repeat films in AM - Keep Mg > 2 and K > 4 for bowel function - Mobilize as able for bowel function. PT consult ordered.   FEN -NPO. Replace K.  VTE -SCD, Lovenox ID -None POC- Eryn Marandola (Son) [phone]. Attempted to call this morning without answer.     LOS: 2 days    [name] , [facility] Surgery 02/23/2019, 9:13 AM Pager: [phone]

## 2019-02-23 NOTE — Evaluation (Signed)
Physical Therapy Evaluation Patient Details Name: Tina Weaver MRN: [id number] DOB: [birth year] Today's Date: 02/23/2019   History of Present Illness  Pt is a 85 y/o female admitted secondary to increased abdominal pain. Found to have SBO and had NG tube placed. PMH includes HTN, cardiomyopathy, d HF, and aortic stenosis.   Clinical Impression  Pt admitted secondary to problem above with deficits below. Pt requiring min guard A for mobility within the room using RW. Pt reports she lives alone and feel pt would benefit from increased supervision initially at d/c to ensure safety at home. If family unable to provide 24/7, pt may benefit from short term SNF to maximize independence prior to return home. Will continue to follow acutely to maximize functional mobility independence and safety.     Follow Up Recommendations Home health PT;Supervision/Assistance - 24 hour(initially )    Equipment Recommendations  Rolling walker with 5" wheels    Recommendations for Other Services       Precautions / Restrictions Precautions Precautions: Fall;Other (comment) Precaution Comments: NG tube Restrictions Weight Bearing Restrictions: No      Mobility  Bed Mobility               General bed mobility comments: In chair upon entry   Transfers Overall transfer level: Needs assistance Equipment used: Rolling walker (2 wheeled) Transfers: Sit to/from Stand Sit to Stand: Min guard         General transfer comment: Min guard for safety.   Ambulation/Gait Ambulation/Gait assistance: Min guard Gait Distance (Feet): 20 Feet Assistive device: Rolling walker (2 wheeled) Gait Pattern/deviations: Step-through pattern;Decreased stride length;Trunk flexed Gait velocity: Decreased   General Gait Details: Slow, cautious gait. No LOB noted with use of RW. Pt requesting to stay inside of room for ambulation this session.   Stairs            Wheelchair Mobility    Modified Rankin  (Stroke Patients Only)       Balance Overall balance assessment: Needs assistance Sitting-balance support: No upper extremity supported;Feet supported Sitting balance-Leahy Scale: Good     Standing balance support: Bilateral upper extremity supported;During functional activity Standing balance-Leahy Scale: Poor Standing balance comment: Reliant on BUE support                              Pertinent Vitals/Pain Pain Assessment: Faces Faces Pain Scale: Hurts a little bit Pain Location: stomach Pain Descriptors / Indicators: Aching Pain Intervention(s): Limited activity within patient's tolerance;Monitored during session;Repositioned    Home Living Family/patient expects to be discharged to:: Private residence Living Arrangements: Alone Available Help at Discharge: Family;Available PRN/intermittently Type of Home: House Home Access: Stairs to enter Entrance Stairs-Rails: Right Entrance Stairs-Number of Steps: 11 Home Layout: Two level Home Equipment: None      Prior Function Level of Independence: Independent               Hand Dominance        Extremity/Trunk Assessment   Upper Extremity Assessment Upper Extremity Assessment: Defer to OT evaluation    Lower Extremity Assessment Lower Extremity Assessment: Generalized weakness    Cervical / Trunk Assessment Cervical / Trunk Assessment: Kyphotic  Communication   Communication: No difficulties  Cognition Arousal/Alertness: Awake/alert Behavior During Therapy: WFL for tasks assessed/performed Overall Cognitive Status: Within Functional Limits for tasks assessed  General Comments      Exercises     Assessment/Plan    PT Assessment Patient needs continued PT services  PT Problem List Decreased strength;Decreased balance;Decreased mobility;Decreased knowledge of use of DME       PT Treatment Interventions Stair training;Gait  training;DME instruction;Functional mobility training;Therapeutic activities;Therapeutic exercise;Balance training;Patient/family education    PT Goals (Current goals can be found in the Care Plan section)  Acute Rehab PT Goals Patient Stated Goal: to go home PT Goal Formulation: With patient Time For Goal Achievement: 03/09/19 Potential to Achieve Goals: Good    Frequency Min 3X/week   Barriers to discharge Decreased caregiver support      Co-evaluation               AM-PAC PT "6 Clicks" Mobility  Outcome Measure Help needed turning from your back to your side while in a flat bed without using bedrails?: A Little Help needed moving from lying on your back to sitting on the side of a flat bed without using bedrails?: A Little Help needed moving to and from a bed to a chair (including a wheelchair)?: A Little Help needed standing up from a chair using your arms (e.g., wheelchair or bedside chair)?: A Little Help needed to walk in hospital room?: A Little Help needed climbing 3-5 steps with a railing? : A Lot 6 Click Score: 17    End of Session Equipment Utilized During Treatment: Gait belt Activity Tolerance: Patient tolerated treatment well Patient left: in chair;with call bell/phone within reach Nurse Communication: Mobility status PT Visit Diagnosis: Other abnormalities of gait and mobility (R26.89);Muscle weakness (generalized) (M62.81)    Time: 1610-9604 PT Time Calculation (min) (ACUTE ONLY): 14 min   Charges:   PT Evaluation $PT Eval Low Complexity: [place], PT, DPT  Acute Rehabilitation Services  Pager: [phone] Office: [phone]   Rudean Hitt 02/23/2019, 1:41 PM

## 2019-02-24 ENCOUNTER — Inpatient Hospital Stay (HOSPITAL_COMMUNITY): Payer: Medicare HMO

## 2019-02-24 LAB — BASIC METABOLIC PANEL
Anion gap: 14 (ref 5–15)
BUN: 29 mg/dL — ABNORMAL HIGH (ref 8–23)
CO2: 23 mmol/L (ref 22–32)
Calcium: 8.3 mg/dL — ABNORMAL LOW (ref 8.9–10.3)
Chloride: 111 mmol/L (ref 98–111)
Creatinine, Ser: 1.04 mg/dL — ABNORMAL HIGH (ref 0.44–1.00)
GFR calc Af Amer: 52 mL/min — ABNORMAL LOW (ref >60)
GFR calc non Af Amer: 45 mL/min — ABNORMAL LOW (ref >60)
Glucose, Bld: 90 mg/dL (ref 70–99)
Potassium: 3 mmol/L — ABNORMAL LOW (ref 3.5–5.1)
Sodium: 148 mmol/L — ABNORMAL HIGH (ref 135–145)

## 2019-02-24 MED ORDER — KCL IN DEXTROSE-NACL 20-5-0.45 MEQ/L-%-% IV SOLN
INTRAVENOUS | Status: AC
  Administered 2019-02-24 – 2019-02-25 (×3): via INTRAVENOUS
  Filled 2019-02-24 (×5): qty 1000

## 2019-02-24 MED ORDER — POTASSIUM CHLORIDE 10 MEQ/100ML IV SOLN
10.0000 meq | INTRAVENOUS | Status: AC
  Administered 2019-02-24 (×3): 10 meq via INTRAVENOUS
  Filled 2019-02-24: qty 100

## 2019-02-24 NOTE — Progress Notes (Addendum)
Physical Therapy Treatment Patient Details Name: Tina Weaver MRN: [id number] DOB: March 14, 1919 Today's Date: 02/24/2019    History of Present Illness Pt is a 85 y/o female admitted secondary to increased abdominal pain. Found to have SBO and had NG tube placed. PMH includes HTN, cardiomyopathy, d HF, and aortic stenosis.     PT Comments    Patient ambulating short distances today. Reports she has no family assistance available at home, if this is truly the case she may need to consider SNF pending her prograss. Will continue to progress OOB mobility and update recs if needed.     Follow Up Recommendations  SNF (HHPT with progress and 24/7 family assist)    Equipment Recommendations  Rolling walker with 5" wheels    Recommendations for Other Services       Precautions / Restrictions Precautions Precautions: Fall;Other (comment) Precaution Comments: NG tube Restrictions Weight Bearing Restrictions: No    Mobility  Bed Mobility Overal bed mobility: Modified Independent Bed Mobility: Supine to Sit     Supine to sit: Min assist     General bed mobility comments: min A to pull trunk upright and to square LEs off of the bed  Transfers Overall transfer level: Needs assistance Equipment used: Rolling walker (2 wheeled) Transfers: Sit to/from Stand Sit to Stand: Min guard         General transfer comment: Min guard for safety.   Ambulation/Gait Ambulation/Gait assistance: Min guard Gait Distance (Feet): 15 Feet Assistive device: Rolling walker (2 wheeled) Gait Pattern/deviations: Step-through pattern;Decreased stride length;Trunk flexed Gait velocity: Decreased   General Gait Details: Slow, cautious gait. No LOB noted with use of RW. Pt requesting to stay inside of room for ambulation this session.    Stairs             Wheelchair Mobility    Modified Rankin (Stroke Patients Only)       Balance Overall balance assessment: Needs  assistance Sitting-balance support: No upper extremity supported;Feet supported Sitting balance-Leahy Scale: Good     Standing balance support: Bilateral upper extremity supported;During functional activity Standing balance-Leahy Scale: Poor Standing balance comment: Reliant on BUE support                             Cognition Arousal/Alertness: Awake/alert Behavior During Therapy: WFL for tasks assessed/performed Overall Cognitive Status: Within Functional Limits for tasks assessed                                        Exercises      General Comments        Pertinent Vitals/Pain Pain Assessment: No/denies pain Faces Pain Scale: Hurts a little bit Pain Location: stomach Pain Descriptors / Indicators: Aching    Home Living Family/patient expects to be discharged to:: Private residence Living Arrangements: Alone Available Help at Discharge: Family;Available PRN/intermittently Type of Home: House Home Access: Stairs to enter Entrance Stairs-Rails: Right Home Layout: Two level Home Equipment: None      Prior Function Level of Independence: Independent      Comments: drives, does own housework and bill pay/meds. gardens in back yard   PT Goals (current goals can now be found in the care plan section) Acute Rehab PT Goals Patient Stated Goal: to go home PT Goal Formulation: With patient Time For Goal Achievement: 03/09/19 Potential to Achieve  Goals: Good Progress towards PT goals: Progressing toward goals    Frequency    Min 3X/week      PT Plan      Co-evaluation              AM-PAC PT "6 Clicks" Mobility   Outcome Measure  Help needed turning from your back to your side while in a flat bed without using bedrails?: A Little Help needed moving from lying on your back to sitting on the side of a flat bed without using bedrails?: A Little Help needed moving to and from a bed to a chair (including a wheelchair)?: A  Little Help needed standing up from a chair using your arms (e.g., wheelchair or bedside chair)?: A Little Help needed to walk in hospital room?: A Little Help needed climbing 3-5 steps with a railing? : A Lot 6 Click Score: 17    End of Session Equipment Utilized During Treatment: Gait belt Activity Tolerance: Patient tolerated treatment well Patient left: in chair;with call bell/phone within reach Nurse Communication: Mobility status PT Visit Diagnosis: Other abnormalities of gait and mobility (R26.89);Muscle weakness (generalized) (M62.81)     Time: 1600-1620 PT Time Calculation (min) (ACUTE ONLY): 20 min  Charges:  $Gait Training: 8-22 mins                    Reinaldo Berber, PT, DPT Acute Rehabilitation Services Pager: [phone] Office: [phone]     Reinaldo Berber 02/24/2019, 4:28 PM

## 2019-02-24 NOTE — Progress Notes (Signed)
Subjective: CC: Abdominal pain Still having abdominal pain and distension. No flatus or BM. Worked with PT yesterday.   Objective: Vital signs in last 24 hours: Temp:  [98 F (36.7 C)-98.3 F (36.8 C)] 98.3 F (36.8 C) (07/30 0459) Pulse Rate:  [63-70] 70 (07/30 0612) Resp:  [21] 21 (07/30 0459) BP: (139-199)/(52-66) 139/52 (07/30 0612) SpO2:  [98 %-100 %] 98 % (07/30 0459) Last BM Date: (PTA)  Intake/Output from previous day: 07/29 0701 - 07/30 0700 In: 3267.3 [I.V.:3267.3] Out: 3050 [Urine:750; Emesis/NG output:2300] Intake/Output this shift: No intake/output data recorded.  PE: Gen: Alert, NAD, pleasant Pulm:Normal rate andeffort normal OMV:EHMCNOBSJ but soft. Generalized tenderness without peritonitis. Hypoactive bowel sounds. NG tube in placeand rehooked to [facility] with return. No output in cannister. NG tube rehooked to [employer].. 2376mL recorded overnight.  Ext: No LEedema  Lab Results:  Recent Labs    02/22/19 0722  WBC 9.8  HGB 13.0  HCT 39.5  PLT 211   BMET Recent Labs    02/23/19 0436 02/24/19 0725  NA 149* 148*  K 3.4* 3.0*  CL 112* 111  CO2 22 23  GLUCOSE 114* 90  BUN 34* 29*  CREATININE 1.26* 1.04*  CALCIUM 8.4* 8.3*   PT/INR No results for input(s): LABPROT, INR in the last 72 hours. CMP     Component Value Date/Time   NA 148 (H) 02/24/2019 0725   K 3.0 (L) 02/24/2019 0725   CL 111 02/24/2019 0725   CO2 23 02/24/2019 0725   GLUCOSE 90 02/24/2019 0725   BUN 29 (H) 02/24/2019 0725   CREATININE 1.04 (H) 02/24/2019 0725   CREATININE 1.00 (H) 05/02/2016 0830   CALCIUM 8.3 (L) 02/24/2019 0725   PROT 7.2 02/20/2019 2253   ALBUMIN 3.7 02/20/2019 2253   AST 23 02/20/2019 2253   ALT 13 02/20/2019 2253   ALKPHOS 81 02/20/2019 2253   BILITOT 0.6 02/20/2019 2253   GFRNONAA 45 (L) 02/24/2019 0725   GFRAA 52 (L) 02/24/2019 0725   Lipase     Component Value Date/Time   LIPASE 34 02/20/2019 2253       Studies/Results: Dg Abd  Portable 1v  Result Date: 02/24/2019 CLINICAL DATA:  Small-bowel obstruction EXAM: PORTABLE ABDOMEN - 1 VIEW COMPARISON:  02/23/2019, 02/22/2019 FINDINGS: Enteric tube remains appropriately positioned within the stomach. Multiple dilated loops of small bowel persist within the abdomen measuring up to 4.7 cm in diameter, slightly increased in caliber compared to 02/23/2019, although not significantly changed compared to 02/22/2019. No gross free intraperitoneal air. Lung bases clear. IMPRESSION: Small-bowel obstruction with slight interval increase in the caliber of dilated small bowel loops compared to most recent prior, although remains similar to appearance 02/22/2019. Electronically Signed   By: Davina Poke M.D.   On: 02/24/2019 08:50   Dg Abd Portable 1v  Result Date: 02/23/2019 CLINICAL DATA:  Small-bowel obstruction EXAM: PORTABLE ABDOMEN - 1 VIEW COMPARISON:  02/22/2019 FINDINGS: Gastric tube remains in the region of the gastric antrum unchanged. Dilated small bowel loops with mild interval improvement. Colon decompressed. IMPRESSION: Mild improvement in dilated small bowel loops compatible with improving small-bowel obstruction. Electronically Signed   By: Franchot Gallo M.D.   On: 02/23/2019 09:22   Dg Abd Portable 1v-small Bowel Obstruction Protocol-24 Hr Delay  Result Date: 02/22/2019 CLINICAL DATA:  Small bowel obstruction EXAM: PORTABLE ABDOMEN - 1 VIEW COMPARISON:  02/21/2019 FINDINGS: There is persistent dilated loops of small bowel in the abdomen to 4.6 cm.  There is faint enteric contrast in the lower abdomen which may be within a dilated small bowel loop. There is a nasogastric tube with the tip projecting over the antrum of the stomach. There is no evidence of pneumoperitoneum, portal venous gas or pneumatosis. There are no pathologic calcifications along the expected course of the ureters. The osseous structures are unremarkable. IMPRESSION: 1. Persistent dilated loops of small  bowel in the abdomen consistent with small bowel obstruction. Electronically Signed   By: Kathreen Devoid   On: 02/22/2019 14:20    Anti-infectives: Anti-infectives (From admission, onward)   None       Assessment/Plan HTN CHF  SBO - Hx appendectomy and abdominal hysterctomy  - SBO protocol. Films this AM with persistent dilated small bowel c/w SBO - Keep Mg > 2 and K > 4 for bowel function - Mobilize as able for bowel function.PT working with patient   FEN -NPO. Replace K.  VTE -SCD, Lovenox ID -None POC- Mairen Wallenstein (Son) [phone]. Viona Gilmore            Gaetana Kawahara [place]) [phone], [phone]             The patient is not clinically or radiographically improving from her SBO at this time. We had a long discussion about continuing with conservative management vs surgery. We discussed general anesthesia, Laparoscopic surgery, the possibility of having to transition to open, the risks of surgery, and the potential disposition (SNF etc) after surgery. The patient wished for me to call her son Dorothyann Peng to discuss this as well, and reports that what he wishes, is how she would like to proceed. I was able to contact [place] over the phone and have the above talk with him. He reports that he would prefer for his mother to continue conservative management for now and he will consider the possibility of surgery is does not continue to improve. I relayed this to the patient who states understanding. Will continue conservative management per family wishes.     LOS: 3 days    [name] , [facility] 02/24/2019, 9:27 AM Pager: [phone]

## 2019-02-24 NOTE — Care Management Important Message (Signed)
Important Message  Patient Details  Name: Tina Weaver MRN: [id number] Date of Birth: [birth year]   Medicare Important Message Given:  Yes     [name] 02/24/2019, 12:35 PM

## 2019-02-24 NOTE — Progress Notes (Signed)
PROGRESS NOTE  Tina Weaver OEV:[id number] DOB: 03/07/1919 DOA: 02/20/2019 PCP: Lajean Manes, MD   LOS: 3 days   Brief narrative: Tina Weaver a 85 y.o.femalewith medical history significant ofHTN, chronic diastolic CHF with hypertensive cardiomyopathy, AS. Patient presented to the ED on 7/27 with c/o abd pain for 3 days associated with nausea, generalized weakness.  CT of the abdomen and pelvis demonstrated a high grade SBO. Patient was admitted for small bowel obstruction. General surgery was consulted and patient was started on conservative management with IV fluid, bowel rest, pain medicine and NG tube suction. Repeat abdominal x-ray today 7/30 slight interval increase in the size of the small bowel loops. In last 72 hours, patient is not clinically or radiographically improving from her bowel obstruction.  General surgery had a discussion with patient and her son/POA who wished to continue conservative management for now.  Subjective: Patient was seen and examined this morning.  Pleasant elderly Caucasian female.  Sitting up in chair.  Not in distress.  Complains of mild dull aching pain in the abdomen.  Has NG tube attached to suction. Chart reviewed.  No fever, blood pressure fluctuating, was as high as 199/66 early this morning,  139/82 later. BMP shows elevated sodium level at 149 and elevated chloride level at 112. Potassium at 3.4.  Creatinine worsening 1.26.  Magnesium normal at 2.3. Last CBC 7/20 normal.   Assessment/Plan:  Principal Problem:   SBO (small bowel obstruction) (HCC) Active Problems:   HTN (hypertension)   CKD (chronic kidney disease), stage III (HCC)   Chronic diastolic heart failure (HCC)  High-grade small bowel obstruction -CT scan finding as above. -Currently nonoperative management but no significant improvement clinically or radiographically. -Per general surgery note, patient and her son/POA who wished to continue conservative management for  now. -IV fluid changed based on electrolyte levels.  Continue pain control.  Continue NG tube to suction.  Hypernatremia/hyperchloremia -Patient has remained in normal saline for last 72 hours.  Suspect overhydration.  However, patient overall looks dehydrated. -Intake output noted.  In last 24 hours, patient had 2300 mL NG tube output and 750 mL urine output  And a positive balance 200 mL. -I switched fluid this morning to D5-1/2NS with K20  To run at 100 ml/hr for now.  Hypokalemia -Potassium level 3.4 this morning. Likely secondary to NG tube suction.  At risk of getting worse.  Can impair recovery of bowel function.   -IV fluids with potassium rider.  Noted IV potassium replacement 10 X3 by general surgery this morning.  Repeat BMP tomorrow.    Cardiovascular issues: HTN, HLD, chronic diastolic CHF, CKD stage III -Prior to admission, patient was on Coreg 18.75 mg twice daily, Lasix 20 mg daily Crestor 5 mg daily. -Continue Coreg, statin.  Lasix on hold. -Continue to monitor renal function.  Creatinine trended up to 1.26 yesterday, improving this morning.  Hypothyroidism  -Continue Synthroid.    Mobility: Encourage ambulation Diet: N.p.o, sips with meds DVT prophylaxis:  Lovenox Code Status:   Code Status: DNR  Family Communication:  Expected Discharge:  Pending recovery from bowel obstruction  Consultants:  General surgery  Procedures:  None  Antimicrobials:  Anti-infectives (From admission, onward)   None      Infusions:  . sodium chloride 250 mL (02/23/19 0955)  . dextrose 5 % and 0.45 % NaCl with KCl 20 mEq/L 75 mL/hr at 02/24/19 0934  . potassium chloride      Scheduled Meds: . carvedilol  18.75 mg Oral BID WC  . enoxaparin (LOVENOX) injection  30 mg Subcutaneous Q24H  . levothyroxine  88 mcg Oral Daily  . rosuvastatin  5 mg Oral q1800  . sodium chloride flush  3 mL Intravenous Once    PRN meds: sodium chloride, acetaminophen **OR** acetaminophen,  fentaNYL (SUBLIMAZE) injection, hydrALAZINE, menthol-cetylpyridinium, ondansetron **OR** ondansetron (ZOFRAN) IV, phenol   Objective: Vitals:   02/24/19 0612 02/24/19 0956  BP: (!) 139/52 (!) 165/58  Pulse: 70 63  Resp:  18  Temp:  98.1 F (36.7 C)  SpO2:  96%    Intake/Output Summary (Last 24 hours) at 02/24/2019 1133 Last data filed at 02/24/2019 0650 Gross per 24 hour  Intake 3267.29 ml  Output 1900 ml  Net 1367.29 ml   Filed Weights   02/20/19 2241 02/21/19 2130 02/22/19 2106  Weight: 50.8 kg 50.5 kg 50.5 kg   Weight change:  Body mass index is 21.74 kg/m.   Physical Exam: General exam: Appears calm and comfortable.  Sitting up in chair.  Not in distress Skin: No rashes, lesions or ulcers. HEENT: Atraumatic, normocephalic, supple neck, no obvious bleeding.  NG tube attached to suction Lungs: Clear to auscultate bilaterally CVS: Regular rate and rhythm, no murmur GI/Abd soft, mild diffuse tenderness, bowel sounds sluggish. CNS: Alert, awake, slow to respond but oriented to place and person Psychiatry: Depressed mood Extremities: No pedal edema, no calf tenderness  Data Review: I have personally reviewed the laboratory data and studies available.  Recent Labs  Lab 02/20/19 2253 02/22/19 0722  WBC 13.9* 9.8  NEUTROABS  --  7.4  HGB 15.0 13.0  HCT 44.0 39.5  MCV 97.1 99.7  PLT 253 211   Recent Labs  Lab 02/20/19 2253 02/21/19 0342 02/22/19 0722 02/23/19 0436 02/24/19 0725  NA 135 137 144 149* 148*  K 4.5 4.4 3.7 3.4* 3.0*  CL 101 103 111 112* 111  CO2 22 22 22 22 23   GLUCOSE 152* 129* 100* 114* 90  BUN 24* 21 23 34* 29*  CREATININE 1.08* 1.00 1.06* 1.26* 1.04*  CALCIUM 9.1 8.8* 8.5* 8.4* 8.3*  MG  --   --   --  2.3  --     Terrilee Croak, MD  [facility] 02/24/2019

## 2019-02-24 NOTE — Evaluation (Signed)
Occupational Therapy Evaluation Patient Details Name: Tina Weaver MRN: [id number] DOB: [birth year] Today's Date: 02/24/2019    History of Present Illness Pt is a 85 y/o female admitted secondary to increased abdominal pain. Found to have SBO and had NG tube placed. PMH includes HTN, cardiomyopathy, d HF, and aortic stenosis.    Clinical Impression   Pt admitted with above diagnoses, with decreased activity tolerance and generalized weakness limiting ability to engage in BADL at desired level of ind. PTA, pt lived alone and completed all BADL/IADL ind such as gardening, etc without use of DME. The only thing she did not do was groceries due to COVID 19 pandemic, per her report. At time of evaluation, pt shows generalized weakness with min A in bed mobility and min guard/ min A for sanding balance with RW. During session, pt stood at sink to complete a variety of grooming tasks. She ultimately completed these in standing, but needed cues to take rest breaks and needed to rest elbows on the sink to conserve energy. Pt also completed BSC t/f at min guard level. At this time recommend SNF vs HHOT pending family ability to provide suport at d/c. Pt voices hx of short term rehab stay with success, and would like to engage in whatever makes her ind again. If she goes home she will needs a BSC and RW, as well as HHOT. Will follow.    Follow Up Recommendations  SNF;Home health OT;Other (comment)(vs pending family ability to provide home supervision)    Equipment Recommendations  3 in 1 bedside commode;Other (comment)(and walker if going home)    Recommendations for Other Services       Precautions / Restrictions        Mobility Bed Mobility Overal bed mobility: Needs Assistance Bed Mobility: Supine to Sit     Supine to sit: Min assist     General bed mobility comments: min A to pull trunk upright and to square LEs off of the bed  Transfers Overall transfer level: Needs  assistance Equipment used: Rolling walker (2 wheeled) Transfers: Sit to/from Stand Sit to Stand: Min guard         General transfer comment: min guard for safety    Balance Overall balance assessment: Needs assistance Sitting-balance support: No upper extremity supported;Feet supported Sitting balance-Leahy Scale: Good     Standing balance support: Bilateral upper extremity supported;During functional activity Standing balance-Leahy Scale: Poor Standing balance comment: Reliant on BUE support; needing to lean on sink when not using RW                           ADL either performed or assessed with clinical judgement   ADL Overall ADL's : Needs assistance/impaired Eating/Feeding: Set up;Sitting   Grooming: Min guard;Standing;Wash/dry face;Oral care;Applying deodorant;Brushing hair Grooming Details (indicate cue type and reason): stood at sink to complete several grooming tasks, no seated rest breaks needed. Min A for balance x1 Upper Body Bathing: Min guard;Standing Upper Body Bathing Details (indicate cue type and reason): standing at sink to wash under arms, assist for line mangement Lower Body Bathing: Minimal assistance;Sit to/from stand;Sitting/lateral leans Lower Body Bathing Details (indicate cue type and reason): stood to clean peri area and tops of thighs, min A for balance and cues to sit to reach feet- pt very fiercely ind Upper Body Dressing : Set up;Sitting   Lower Body Dressing: Set up;Sitting/lateral leans   Toilet Transfer: Min Forensic psychologist Details (  indicate cue type and reason): min guard with BSC in room next to bed, anticipate pt remain min guard to walk to BR Toileting- Clothing Manipulation and Hygiene: Set up;Sit to/from stand;Sitting/lateral lean   Tub/ Shower Transfer: Minimal assistance;Shower seat;3 in 1;Ambulation;Rolling walker   Functional mobility during ADLs: Minimal assistance;Min guard;Rolling walker General ADL  Comments: pt overall min guard-  min A for standing BADL, needs cues to manage energy     Vision Patient Visual Report: No change from baseline       Perception     Praxis      Pertinent Vitals/Pain Pain Assessment: No/denies pain     Hand Dominance     Extremity/Trunk Assessment Upper Extremity Assessment Upper Extremity Assessment: Generalized weakness   Lower Extremity Assessment Lower Extremity Assessment: Defer to PT evaluation   Cervical / Trunk Assessment Cervical / Trunk Assessment: Kyphotic   Communication Communication Communication: No difficulties   Cognition Arousal/Alertness: Awake/alert Behavior During Therapy: WFL for tasks assessed/performed Overall Cognitive Status: Within Functional Limits for tasks assessed                                     General Comments       Exercises     Shoulder Instructions      Home Living Family/patient expects to be discharged to:: Private residence Living Arrangements: Alone Available Help at Discharge: Family;Available PRN/intermittently Type of Home: House Home Access: Stairs to enter [employer] of Steps: 11- enters through carport entrance; front entrance has 2 steps Entrance Stairs-Rails: Right Home Layout: Two level Alternate Level Stairs-Number of Steps: flight Alternate Level Stairs-Rails: Right Bathroom Shower/Tub: Occupational psychologist: Standard     Home Equipment: None          Prior Functioning/Environment Level of Independence: Independent        Comments: drives, does own housework and bill pay/meds. gardens in back yard        OT Problem List: Decreased strength;Decreased knowledge of use of DME or AE;Decreased activity tolerance;Impaired balance (sitting and/or standing)      OT Treatment/Interventions: Self-care/ADL training;Therapeutic exercise;Patient/family education;Balance training;Energy conservation;Therapeutic activities;DME  and/or AE instruction;Cognitive remediation/compensation    OT Goals(Current goals can be found in the care plan section) Acute Rehab OT Goals Patient Stated Goal: to get better OT Goal Formulation: With patient Time For Goal Achievement: 03/10/19 Potential to Achieve Goals: Good  OT Frequency: Min 2X/week   Barriers to D/C:            Co-evaluation              AM-PAC OT "6 Clicks" Daily Activity     Outcome Measure Help from another person eating meals?: None Help from another person taking care of personal grooming?: A Little Help from another person toileting, which includes using toliet, bedpan, or urinal?: A Little Help from another person bathing (including washing, rinsing, drying)?: A Little Help from another person to put on and taking off regular upper body clothing?: None Help from another person to put on and taking off regular lower body clothing?: A Little 6 Click Score: 20   End of Session Equipment Utilized During Treatment: Gait belt;Rolling walker Nurse Communication: Other (comment)(ok for ice/water)  Activity Tolerance: Patient tolerated treatment well Patient left: in chair;with call bell/phone within reach;with chair alarm set  OT Visit Diagnosis: Muscle weakness (generalized) (M62.81);Other abnormalities of gait and  mobility (R26.89)                Time: 3200-3794 OT Time Calculation (min): 49 min Charges:  OT General Charges $OT Visit: 1 Visit OT Evaluation $OT Eval Low Complexity: 1 Low OT Treatments $Self Care/Home Management : 23-37 mins  Zenovia Jarred, MSOT, OTR/L Behavioral Health OT/ Acute Relief OT [facility] Office: [phone]   Zenovia Jarred 02/24/2019, 2:28 PM

## 2019-02-25 ENCOUNTER — Inpatient Hospital Stay: Payer: Self-pay

## 2019-02-25 ENCOUNTER — Inpatient Hospital Stay (HOSPITAL_COMMUNITY): Payer: Medicare HMO

## 2019-02-25 DIAGNOSIS — I5032 Chronic diastolic (congestive) heart failure: Secondary | ICD-10-CM

## 2019-02-25 DIAGNOSIS — Z01810 Encounter for preprocedural cardiovascular examination: Secondary | ICD-10-CM

## 2019-02-25 DIAGNOSIS — E440 Moderate protein-calorie malnutrition: Secondary | ICD-10-CM | POA: Insufficient documentation

## 2019-02-25 DIAGNOSIS — K56609 Unspecified intestinal obstruction, unspecified as to partial versus complete obstruction: Secondary | ICD-10-CM

## 2019-02-25 LAB — CBC WITH DIFFERENTIAL/PLATELET
Abs Immature Granulocytes: 0.04 10*3/uL (ref 0.00–0.07)
Basophils Absolute: 0 10*3/uL (ref 0.0–0.1)
Basophils Relative: 0 %
Eosinophils Absolute: 0.3 10*3/uL (ref 0.0–0.5)
Eosinophils Relative: 3 %
HCT: 35.7 % — ABNORMAL LOW (ref 36.0–46.0)
Hemoglobin: 11.5 g/dL — ABNORMAL LOW (ref 12.0–15.0)
Immature Granulocytes: 0 %
Lymphocytes Relative: 17 %
Lymphs Abs: 1.7 10*3/uL (ref 0.7–4.0)
MCH: 32.2 pg (ref 26.0–34.0)
MCHC: 32.2 g/dL (ref 30.0–36.0)
MCV: 100 fL (ref 80.0–100.0)
Monocytes Absolute: 1.1 10*3/uL — ABNORMAL HIGH (ref 0.1–1.0)
Monocytes Relative: 11 %
Neutro Abs: 7 10*3/uL (ref 1.7–7.7)
Neutrophils Relative %: 69 %
Platelets: 180 10*3/uL (ref 150–400)
RBC: 3.57 MIL/uL — ABNORMAL LOW (ref 3.87–5.11)
RDW: 14.3 % (ref 11.5–15.5)
WBC: 10.2 10*3/uL (ref 4.0–10.5)
nRBC: 0 % (ref 0.0–0.2)

## 2019-02-25 LAB — BASIC METABOLIC PANEL
Anion gap: 7 (ref 5–15)
BUN: 34 mg/dL — ABNORMAL HIGH (ref 8–23)
CO2: 26 mmol/L (ref 22–32)
Calcium: 8 mg/dL — ABNORMAL LOW (ref 8.9–10.3)
Chloride: 111 mmol/L (ref 98–111)
Creatinine, Ser: 1.31 mg/dL — ABNORMAL HIGH (ref 0.44–1.00)
GFR calc Af Amer: 39 mL/min — ABNORMAL LOW (ref >60)
GFR calc non Af Amer: 34 mL/min — ABNORMAL LOW (ref >60)
Glucose, Bld: 170 mg/dL — ABNORMAL HIGH (ref 70–99)
Potassium: 3.6 mmol/L (ref 3.5–5.1)
Sodium: 144 mmol/L (ref 135–145)

## 2019-02-25 LAB — GLUCOSE, CAPILLARY: Glucose-Capillary: 169 mg/dL — ABNORMAL HIGH (ref 70–99)

## 2019-02-25 IMAGING — DX PORTABLE ABDOMEN - 1 VIEW
1 series · 1 of 1 positions shown · non-contrast
Comparison: [DATE]. CT [DATE].

CLINICAL DATA: Small-bowel obstruction.

EXAM:
PORTABLE ABDOMEN - 1 VIEW

[abdomen]
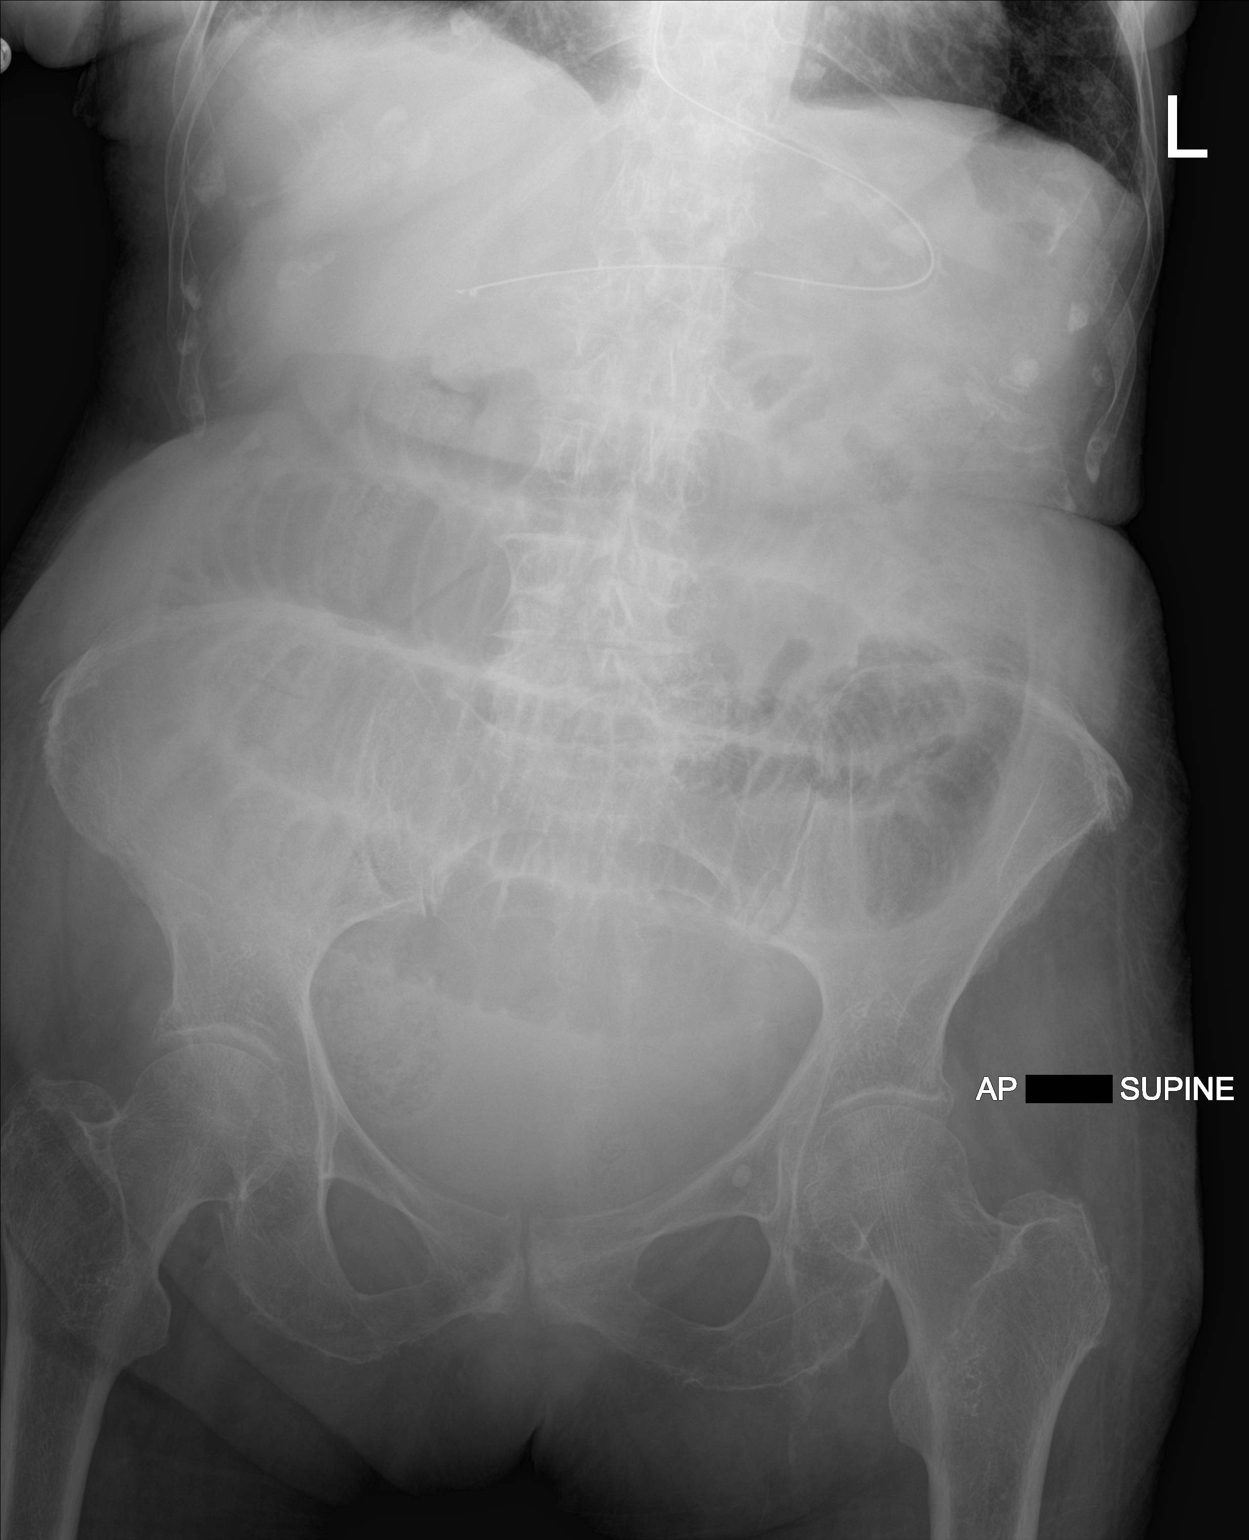

[1 of 1 positions shown; findings below may reference images not displayed]

FINDINGS: NG tube stable position. Persistent distention of small bowel loops
without significant interim change. Findings consistent small bowel
obstruction. No free air. Stable calcific density in the left lower
pelvis consistent. No acute bony abnormality.
IMPRESSION: 1.  NG tube stable position.

2. Persistent positions small-bowel loops without significant
interim change. Findings consistent small bowel obstruction. No free
air.

## 2019-02-25 MED ORDER — KCL IN DEXTROSE-NACL 20-5-0.45 MEQ/L-%-% IV SOLN
INTRAVENOUS | Status: DC
  Administered 2019-02-25: 17:00:00 via INTRAVENOUS
  Filled 2019-02-25: qty 1000

## 2019-02-25 MED ORDER — SODIUM CHLORIDE 0.9 % IV SOLN: INTRAVENOUS | Status: DC

## 2019-02-25 MED ORDER — INSULIN ASPART 100 UNIT/ML ~~LOC~~ SOLN
0.0000 [IU] | SUBCUTANEOUS | Status: DC
  Administered 2019-02-25: 2 [IU] via SUBCUTANEOUS
  Administered 2019-02-26 – 2019-02-28 (×10): 1 [IU] via SUBCUTANEOUS
  Administered 2019-02-28: 2 [IU] via SUBCUTANEOUS
  Administered 2019-03-01 (×2): 1 [IU] via SUBCUTANEOUS
  Administered 2019-03-02: 2 [IU] via SUBCUTANEOUS
  Administered 2019-03-02 – 2019-03-03 (×3): 1 [IU] via SUBCUTANEOUS

## 2019-02-25 MED ORDER — FUROSEMIDE 10 MG/ML IJ SOLN
40.0000 mg | Freq: Once | INTRAMUSCULAR | Status: AC
  Administered 2019-02-25: 40 mg via INTRAVENOUS
  Filled 2019-02-25: qty 4

## 2019-02-25 MED ORDER — SODIUM CHLORIDE 0.9% FLUSH
10.0000 mL | INTRAVENOUS | Status: DC | PRN
  Administered 2019-03-08: 10 mL
  Filled 2019-02-25: qty 40

## 2019-02-25 MED ORDER — TRAVASOL 10 % IV SOLN
INTRAVENOUS | Status: AC
  Administered 2019-02-25: 18:00:00 via INTRAVENOUS
  Filled 2019-02-25: qty 356.16

## 2019-02-25 MED ORDER — POTASSIUM CHLORIDE 10 MEQ/100ML IV SOLN
10.0000 meq | INTRAVENOUS | Status: AC
  Administered 2019-02-25 (×2): 10 meq via INTRAVENOUS
  Filled 2019-02-25 (×2): qty 100

## 2019-02-25 NOTE — Progress Notes (Signed)
Subjective: CC: Abdominal pain Still having abdominal pain and distension. No flatus or BM. Worked with PT yesterday.   Objective: Vital signs in last 24 hours: Temp:  [97.5 F (36.4 C)-98.3 F (36.8 C)] 97.9 F (36.6 C) (07/31 0800) Pulse Rate:  [58-61] 60 (07/31 0800) Resp:  [16-20] 20 (07/31 0800) BP: (150-168)/(55-62) 168/62 (07/31 0800) SpO2:  [97 %-100 %] 97 % (07/31 0800) Weight:  [45 kg] 45 kg (07/31 0300) Last BM Date: 02/23/19  Intake/Output from previous day: 07/30 0701 - 07/31 0700 In: 2301.9 [P.O.:300; I.V.:2001.9] Out: 1100 [Urine:300; Emesis/NG output:800] Intake/Output this shift: No intake/output data recorded.  PE: Gen: Alert, NAD, pleasant Pulm:Normal rate andeffort normal GGY:IRSWNIOEV but soft. Generalized tenderness without peritonitis. Hypoactive bowel sounds. NG tube flushed and rehooked to [facility] with return. Bilious output in cannister.  NG tube rehooked to [employer]. 819mL recorded overnight.  Ext: No LEedema  Lab Results:  Recent Labs    02/25/19 0519  WBC 10.2  HGB 11.5*  HCT 35.7*  PLT 180   BMET Recent Labs    02/24/19 0725 02/25/19 0519  NA 148* 144  K 3.0* 3.6  CL 111 111  CO2 23 26  GLUCOSE 90 170*  BUN 29* 34*  CREATININE 1.04* 1.31*  CALCIUM 8.3* 8.0*   PT/INR No results for input(s): LABPROT, INR in the last 72 hours. CMP     Component Value Date/Time   NA 144 02/25/2019 0519   K 3.6 02/25/2019 0519   CL 111 02/25/2019 0519   CO2 26 02/25/2019 0519   GLUCOSE 170 (H) 02/25/2019 0519   BUN 34 (H) 02/25/2019 0519   CREATININE 1.31 (H) 02/25/2019 0519   CREATININE 1.00 (H) 05/02/2016 0830   CALCIUM 8.0 (L) 02/25/2019 0519   PROT 7.2 02/20/2019 2253   ALBUMIN 3.7 02/20/2019 2253   AST 23 02/20/2019 2253   ALT 13 02/20/2019 2253   ALKPHOS 81 02/20/2019 2253   BILITOT 0.6 02/20/2019 2253   GFRNONAA 34 (L) 02/25/2019 0519   GFRAA 39 (L) 02/25/2019 0519   Lipase     Component Value Date/Time   LIPASE  34 02/20/2019 2253       Studies/Results: Dg Abd Portable 1v  Result Date: 02/25/2019 CLINICAL DATA:  Small-bowel obstruction. EXAM: PORTABLE ABDOMEN - 1 VIEW COMPARISON:  02/24/2019. CT 02/21/2019. FINDINGS: NG tube stable position. Persistent distention of small bowel loops without significant interim change. Findings consistent small bowel obstruction. No free air. Stable calcific density in the left lower pelvis consistent. No acute bony abnormality. IMPRESSION: 1.  NG tube stable position. 2. Persistent positions small-bowel loops without significant interim change. Findings consistent small bowel obstruction. No free air. Electronically Signed   By: Marcello Moores  Register   On: 02/25/2019 05:56   Dg Abd Portable 1v  Result Date: 02/24/2019 CLINICAL DATA:  Small-bowel obstruction EXAM: PORTABLE ABDOMEN - 1 VIEW COMPARISON:  02/23/2019, 02/22/2019 FINDINGS: Enteric tube remains appropriately positioned within the stomach. Multiple dilated loops of small bowel persist within the abdomen measuring up to 4.7 cm in diameter, slightly increased in caliber compared to 02/23/2019, although not significantly changed compared to 02/22/2019. No gross free intraperitoneal air. Lung bases clear. IMPRESSION: Small-bowel obstruction with slight interval increase in the caliber of dilated small bowel loops compared to most recent prior, although remains similar to appearance 02/22/2019. Electronically Signed   By: Davina Poke M.D.   On: 02/24/2019 08:50   [place] Ekg Site Rite  Result Date: 02/25/2019  If [employer] not attached, placement could not be confirmed due to current cardiac rhythm.   Anti-infectives: Anti-infectives (From admission, onward)   None       Assessment/Plan HTN CHF  SBO - Hx appendectomy and abdominal hysterctomy  - SBO protocol. Films this AM with persistent dilated small bowel c/w SBO - Keep Mg > 2 and K > 4 for bowel function - Mobilize as able for bowel function.PT  working with patient   FEN -TPN. Replace K. VTE -SCD, Lovenox ID -None POC- Rani Idler (Son) [phone]. Viona Gilmore            Da Michelle [place]) [phone], [phone]             The patient is not clinically or radiographically improving from her SBO at this time. We had a long discussion again about continuing with conservative management vs surgery. We discussed general anesthesia, Laparoscopic surgery, the possibility of having to transition to open, the risks of surgery, and the potential disposition (SNF etc) after surgery. The patient still wishes for me to call her son Dorothyann Peng to discuss this as well, and reports that what he wishes, is how she would like to proceed. I was able to contact [place] over the phone and have the above talk with him. He reports he would like to speak with Dr. Redmond Pulling before making a decision on how he would like to proceed..     LOS: 4 days    [name] , [facility] 02/25/2019, 10:16 AM Pager: [phone]

## 2019-02-25 NOTE — Progress Notes (Signed)
Initial Nutrition Assessment  DOCUMENTATION CODES:   Non-severe (moderate) malnutrition in context of chronic illness  INTERVENTION:   - TPN per Pharmacy  Monitor magnesium, potassium, and phosphorus daily for at least 3 days, MD to replete as needed, as pt is at risk for refeeding syndrome.  NUTRITION DIAGNOSIS:   Moderate Malnutrition related to chronic illness (CHF) as evidenced by mild fat depletion, moderate fat depletion, mild muscle depletion, moderate muscle depletion.  GOAL:   Patient will meet greater than or equal to 90% of their needs  MONITOR:   Labs, I & O's, Weight trends, Diet advancement, Other (TPN)  REASON FOR ASSESSMENT:   Consult New TPN/TNA  ASSESSMENT:   85 year old female who presented to the ED on 7/26 with nausea, abdominal pain, and weakness. PMH of HTN with LVH, hypertrophic cardiomyopathy, CHF, and aortic stenosis. Pt was found to have a SBO and NG tube placed by fluoro.  Per Surgery note, the pt s not clinically or radiographically improving from SBO. Pt and family would like to avoid surgery and continue conservative management for now.  Plan is for pt to have PICC placed today and TPN to start this evening.  Per Pharmacy, TPN to start at 28 ml/hr at 1800 today. This rate provides 746 kcal and 36 grams of protein.  Spoke with pt at bedside. Pt resting comfortably but reporting her abdomen feels "tight." Pt states it is no worse or better compared to yesterday.  Pt reports that that last time she had something to eat was Friday (1 week ago today). Pt reports she had a lot of nausea on Saturday and Sunday then came to the hospital on Sunday. Pt has been NPO since admission. Pt is at refeeding risk.  Pt shares that when she feels well, she eats 3 meals daily and has a good appetite.  Breakfast: [place] sausage, toast with jelly, tomato Lunch: tomato sandwich with lettuce and cheese Dinner: meat, starch, vegetables  Pt reports that her weight  has been stable between 109-112 lbs since 2016. Pt states that in 2016, she was hospitalized for CHF and lost some weight at that time but has since maintained her weight.  Reviewed weight history in chart. Noted pt with progressive weight loss starting in 2016. Pt with a 5.1 kg weight loss since 01/05/19. Unsure of accuracy of current weight as weight on admission was 50.8 kg and current weight is 45 kg. Will continue to monitor trends.  Medications reviewed and include: SSI q 4 hours, KCl 10 mEq x 2 runs IVF: D5 and 1/2 NS with KCl @ 100 ml/hr (to decrease to 70 ml/hr once TPN initiated at 1800)  Labs reviewed: BUN 34, creatinine 1.31  UOP: 300 ml x 24 hours per I/O's flowsheet NGT: 800 ml x 24 hours I/O's: +766 ml since admit  NUTRITION - FOCUSED PHYSICAL EXAM:    Most Recent Value  Orbital Region  Moderate depletion  Upper Arm Region  Moderate depletion  Thoracic and Lumbar Region  Mild depletion  Buccal Region  No depletion  Temple Region  No depletion  Clavicle Bone Region  Moderate depletion  Clavicle and Acromion Bone Region  Moderate depletion  Scapular Bone Region  Mild depletion  Dorsal Hand  Mild depletion  Patellar Region  Moderate depletion  Anterior Thigh Region  Moderate depletion  Posterior Calf Region  Moderate depletion  Edema (RD Assessment)  None  Hair  Reviewed  Eyes  Reviewed  Mouth  Reviewed  Skin  Reviewed  Nails  Reviewed       Diet Order:   Diet Order            Diet NPO time specified Except for: Sips with Meds, Ice Chips  Diet effective now              EDUCATION NEEDS:   Education needs have been addressed  Skin:  Skin Assessment: Reviewed RN Assessment  Last BM:  02/23/19  Height:   Ht Readings from Last 1 Encounters:  02/20/19 5' (1.524 m)    Weight:   Wt Readings from Last 1 Encounters:  02/25/19 45 kg    Ideal Body Weight:  45.5 kg  BMI:  Body mass index is 19.38 kg/m.  Estimated Nutritional Needs:   Kcal:   1250-1450  Protein:  60-75 grams  Fluid:  >/= 1.3 L    Gaynell Face, MS, RD, LDN Inpatient Clinical Dietitian Pager: [phone] Weekend/After Hours: [phone]

## 2019-02-25 NOTE — Consult Note (Addendum)
Cardiology Consultation:   Patient ID: Tina Weaver MRN: [id number]; DOB: Aug 11, 1918  Admit date: 02/20/2019 Date of Consult: 02/25/2019  Primary Care Provider: Lajean Manes, MD Primary Cardiologist: Tina Grooms, MD Primary Electrophysiologist:  None    Patient Profile:   Tina Weaver is a 85 y.o. female with a hx of hypertension, chronic diastolic heart failure, hypertensive cardiomyopathy, carotid artery disease and aortic stenosis who is being seen today for preoperative clearance in the setting SBO at the request of Dr. Redmond Weaver.  History of Present Illness:   Ms. Tina Weaver is a 85 year old female with past medical history of hypertension, chronic diastolic heart failure, hypertensive cardiomyopathy, carotid artery disease and aortic stenosis.  She does not have any prior diagnosis of CAD.  Previous echocardiogram obtained on 03/08/2015 showed EF 65 to 70%, grade 1 DD, severe LVH, moderate aortic stenosis.  She has known prior history of occluded right ICA.  Previous carotid Doppler obtained in June 2017 showed left ICA 40 to 59% disease.  Unchanged when compared to the previous study in 2015.  Despite her advanced age, she is fairly independent.  Her son does do shopping and cleaning of the backyard.  Otherwise she lives by herself and able to cook for herself and the clean the house as well.  She has a stepping machine and she does 20 minutes of stepping exercise on a daily basis.  Her front door to the mailbox is about 15 yards and she pick up her mail every day as well.  If she does not have any lower extremity edema, orthopnea, PND or exertional chest pain.  He does have some baseline dyspnea on exertion.   Heart Pathway Score:     Past Medical History:  Diagnosis Date  . CHF (congestive heart failure) ([place])   . DOE (dyspnea on exertion) 09/11/2015  . Hypertension     Past Surgical History:  Procedure Laterality Date  . ABDOMINAL HYSTERECTOMY    . APPENDECTOMY       Home  Medications:  Prior to Admission medications   Medication Sig Start Date End Date Taking? Authorizing Provider  aspirin 81 MG chewable tablet Chew 81 mg by mouth daily.   Yes [provider]  carvedilol (COREG) 12.5 MG tablet TAKE 1 AND 1/2 TABLETS BY MOUTH TWICE DAILY Patient taking differently: Take 18.75 mg by mouth 2 (two) times daily with a meal.  10/01/18  Yes Tina Booze, MD  furosemide (LASIX) 20 MG tablet TAKE 1 TABLET BY MOUTH EVERY DAY Patient taking differently: Take 20 mg by mouth daily.  10/01/18  Yes Tina Booze, MD  levothyroxine (SYNTHROID, LEVOTHROID) 88 MCG tablet Take 88 mcg by mouth daily. 03/21/16  Yes [provider]  potassium chloride SA (K-DUR,KLOR-CON) 20 MEQ tablet TAKE 1 TABLET BY MOUTH 2 TIMES A DAY Patient taking differently: Take 20 mEq by mouth daily.  04/13/18  Yes Tina Booze, MD  rosuvastatin (CRESTOR) 5 MG tablet Take 1 tablet (5 mg total) by mouth daily at 6 PM. Please keep upcoming appt for future refills. Thank you 01/03/19  Yes Tina Booze, MD    Inpatient Medications: Scheduled Meds: . carvedilol  18.75 mg Oral BID WC  . enoxaparin (LOVENOX) injection  30 mg Subcutaneous Q24H  . insulin aspart  0-9 Units Subcutaneous Q4H  . levothyroxine  88 mcg Oral Daily  . rosuvastatin  5 mg Oral q1800  . sodium chloride flush  3 mL Intravenous Once   Continuous Infusions: .  sodium chloride 250 mL (02/23/19 0955)  . dextrose 5 % and 0.45 % NaCl with KCl 20 mEq/L 100 mL/hr at 02/25/19 0524  . dextrose 5 % and 0.45 % NaCl with KCl 20 mEq/L    . TPN ADULT (ION)     PRN Meds: sodium chloride, acetaminophen **OR** acetaminophen, fentaNYL (SUBLIMAZE) injection, hydrALAZINE, menthol-cetylpyridinium, ondansetron **OR** ondansetron (ZOFRAN) IV, phenol  Allergies:    Allergies  Allergen Reactions  . Norvasc [Amlodipine] Swelling    Lower extremity edema with 10 mg dose, tolerates 5mg  without problems. Lower extremity  edema with 10 mg dose, tolerates 5mg  without problems.  . Alendronate Other (See Comments)    Pain to ext's  . Fosamax [Alendronate Sodium] Other (See Comments)    "Pain to legs" per pt's family member    Social History:   Social History   Socioeconomic History  . Marital status: Widowed    Spouse name: Not on file  . Number of children: Not on file  . Years of education: Not on file  . Highest education level: Not on file  Occupational History  . Not on file  Social Needs  . Financial resource strain: Not on file  . Food insecurity    Worry: Not on file    Inability: Not on file  . Transportation needs    Medical: Not on file    Non-medical: Not on file  Tobacco Use  . Smoking status: Never Smoker  . Smokeless tobacco: Never Used  Substance and Sexual Activity  . Alcohol use: No  . Drug use: Never  . Sexual activity: Not Currently  Lifestyle  . Physical activity    Days per week: Not on file    Minutes per session: Not on file  . Stress: Not on file  Relationships  . Social Herbalist on phone: Not on file    Gets together: Not on file    Attends religious service: Not on file    Active member of club or organization: Not on file    Attends meetings of clubs or organizations: Not on file    Relationship status: Not on file  . Intimate partner violence    Fear of current or ex partner: Not on file    Emotionally abused: Not on file    Physically abused: Not on file    Forced sexual activity: Not on file  Other Topics Concern  . Not on file  Social History Narrative  . Not on file    Family History:    Family History  Problem Relation Age of Onset  . Cancer Mother 43  . Heart attack Father 11       HEART DISEASE  . Stroke Father   . Heart attack Brother   . Cirrhosis Sister   . Other Sister        HEALTHY  . Other Sister        HEALTHY  . Hyperlipidemia Son   . Hyperlipidemia Son   . Hypertension Sister      ROS:  Please see the  history of present illness.   All other ROS reviewed and negative.     Physical Exam/Data:   Vitals:   02/24/19 2046 02/25/19 0300 02/25/19 0434 02/25/19 0800  BP: (!) 150/62  (!) 154/55 (!) 168/62  Pulse: 61  (!) 58 60  Resp: 16  16 20   Temp: (!) 97.5 F (36.4 C)  98.3 F (36.8 C) 97.9 F (36.6 C)  TempSrc: Oral  Oral Oral  SpO2: 100%  98% 97%  Weight:  45 kg    Height:        Intake/Output Summary (Last 24 hours) at 02/25/2019 1342 Last data filed at 02/25/2019 0601 Gross per 24 hour  Intake 2301.91 ml  Output 1100 ml  Net 1201.91 ml   Last 3 Weights 02/25/2019 02/22/2019 02/21/2019  Weight (lbs) 99 lb 3.3 oz 111 lb 5.3 oz 111 lb 5.3 oz  Weight (kg) 45 kg 50.5 kg 50.5 kg     Body mass index is 19.38 kg/m.  General:  Well nourished, well developed, in no acute distress HEENT: normal Lymph: no adenopathy Neck: no JVD Endocrine:  No thryomegaly Vascular: No carotid bruits; FA pulses 2+ bilaterally without bruits  Cardiac:  normal S1, S2; RRR. 2/6 murmur  Lungs:  clear to auscultation bilaterally, no wheezing, rhonchi or rales  Abd: soft, nontender, no hepatomegaly  Ext: no edema Musculoskeletal:  No deformities, BUE and BLE strength normal and equal Skin: warm and dry  Neuro:  CNs 2-12 intact, no focal abnormalities noted Psych:  Normal affect   EKG:  The EKG was personally reviewed and demonstrates: sinus bradycardia, poor R wave progression in anterior leads on last EKG 01/05/2018  Telemetry:  Telemetry was personally reviewed and demonstrates: Not on telemetry  Relevant CV Studies:  Echo 03/08/2015 LV EF: 65% -   70% Study Conclusions  - Left ventricle: The cavity size was normal. Wall thickness was   increased in a pattern of severe LVH. Systolic function was   vigorous. The estimated ejection fraction was in the range of 65%   to 70%. Wall motion was normal; there were no regional wall   motion abnormalities. Doppler parameters are consistent with    abnormal left ventricular relaxation (grade 1 diastolic   dysfunction). - Aortic valve: There was moderate stenosis. Valve area (VTI): 1.28   cm^2. Valve area (Vmax): 0.87 cm^2. Valve area (Vmean): 1.33   cm^2. - Mitral valve: Mildly calcified annulus.   Laboratory Data:  High Sensitivity Troponin:  No results for input(s): TROPONINIHS in the last 720 hours.   Cardiac EnzymesNo results for input(s): TROPONINI in the last 168 hours. No results for input(s): TROPIPOC in the last 168 hours.  Chemistry Recent Labs  Lab 02/23/19 0436 02/24/19 0725 02/25/19 0519  NA 149* 148* 144  K 3.4* 3.0* 3.6  CL 112* 111 111  CO2 22 23 26   GLUCOSE 114* 90 170*  BUN 34* 29* 34*  CREATININE 1.26* 1.04* 1.31*  CALCIUM 8.4* 8.3* 8.0*  GFRNONAA 35* 45* 34*  GFRAA 41* 52* 39*  ANIONGAP 15 14 7     Recent Labs  Lab 02/20/19 2253  PROT 7.2  ALBUMIN 3.7  AST 23  ALT 13  ALKPHOS 81  BILITOT 0.6   Hematology Recent Labs  Lab 02/20/19 2253 02/22/19 0722 02/25/19 0519  WBC 13.9* 9.8 10.2  RBC 4.53 3.96 3.57*  HGB 15.0 13.0 11.5*  HCT 44.0 39.5 35.7*  MCV 97.1 99.7 100.0  MCH 33.1 32.8 32.2  MCHC 34.1 32.9 32.2  RDW 14.0 14.4 14.3  PLT 253 211 180   BNPNo results for input(s): BNP, PROBNP in the last 168 hours.  DDimer No results for input(s): DDIMER in the last 168 hours.   Radiology/Studies:  Dg Abd Portable 1v  Result Date: 02/25/2019 CLINICAL DATA:  Small-bowel obstruction. EXAM: PORTABLE ABDOMEN - 1 VIEW COMPARISON:  02/24/2019. CT 02/21/2019. FINDINGS: NG tube stable position.  Persistent distention of small bowel loops without significant interim change. Findings consistent small bowel obstruction. No free air. Stable calcific density in the left lower pelvis consistent. No acute bony abnormality. IMPRESSION: 1.  NG tube stable position. 2. Persistent positions small-bowel loops without significant interim change. Findings consistent small bowel obstruction. No free air.  Electronically Signed   By: Marcello Moores  Register   On: 02/25/2019 05:56   Dg Abd Portable 1v  Result Date: 02/24/2019 CLINICAL DATA:  Small-bowel obstruction EXAM: PORTABLE ABDOMEN - 1 VIEW COMPARISON:  02/23/2019, 02/22/2019 FINDINGS: Enteric tube remains appropriately positioned within the stomach. Multiple dilated loops of small bowel persist within the abdomen measuring up to 4.7 cm in diameter, slightly increased in caliber compared to 02/23/2019, although not significantly changed compared to 02/22/2019. No gross free intraperitoneal air. Lung bases clear. IMPRESSION: Small-bowel obstruction with slight interval increase in the caliber of dilated small bowel loops compared to most recent prior, although remains similar to appearance 02/22/2019. Electronically Signed   By: Davina Poke M.D.   On: 02/24/2019 08:50   Dg Abd Portable 1v  Result Date: 02/23/2019 CLINICAL DATA:  Small-bowel obstruction EXAM: PORTABLE ABDOMEN - 1 VIEW COMPARISON:  02/22/2019 FINDINGS: Gastric tube remains in the region of the gastric antrum unchanged. Dilated small bowel loops with mild interval improvement. Colon decompressed. IMPRESSION: Mild improvement in dilated small bowel loops compatible with improving small-bowel obstruction. Electronically Signed   By: Franchot Gallo M.D.   On: 02/23/2019 09:22   Dg Abd Portable 1v-small Bowel Obstruction Protocol-24 Hr Delay  Result Date: 02/22/2019 CLINICAL DATA:  Small bowel obstruction EXAM: PORTABLE ABDOMEN - 1 VIEW COMPARISON:  02/21/2019 FINDINGS: There is persistent dilated loops of small bowel in the abdomen to 4.6 cm. There is faint enteric contrast in the lower abdomen which may be within a dilated small bowel loop. There is a nasogastric tube with the tip projecting over the antrum of the stomach. There is no evidence of pneumoperitoneum, portal venous gas or pneumatosis. There are no pathologic calcifications along the expected course of the ureters. The osseous  structures are unremarkable. IMPRESSION: 1. Persistent dilated loops of small bowel in the abdomen consistent with small bowel obstruction. Electronically Signed   By: Kathreen Devoid   On: 02/22/2019 14:20   Dg Abd Portable 1v-small Bowel Obstruction Protocol-initial, 8 Hr Delay  Result Date: 02/21/2019 CLINICAL DATA:  Small-bowel obstruction, 8 hour delay EXAM: PORTABLE ABDOMEN - 1 VIEW COMPARISON:  02/21/2019 FINDINGS: Esophageal tube tip overlies the gastroduodenal junction. Dilute contrast present within persistently dilated small bowel. Small bowel distension up to 4.2 cm. No definite contrast within the colon. Excreted contrast within the urinary bladder. IMPRESSION: Dilute contrast present within persistently dilated small bowel, consistent with bowel obstruction. No definitive contrast observed within the colon. Electronically Signed   By: Donavan Foil M.D.   On: 02/21/2019 21:07   [place] Ekg Site Rite  Result Date: 02/25/2019 If Site Rite image not attached, placement could not be confirmed due to current cardiac rhythm.   Assessment and Plan:   1. Preoperative clearance: Her biggest risk factor is her age, fortunately, she is fairly active at home.  MD to evaluate her perioperative risk  2. Hypertension: Blood pressure continue to be elevated during the hospitalization.  However some of this may be related to abdominal pain.  3. Chronic diastolic heart failure: Euvolemic on physical exam.  4. Hypertrophic cardiomyopathy: She has history of LVH on the previous echocardiogram.  5. Carotid artery  disease: No occluded right internal carotid artery, 40 to 59% disease on the previous carotid Doppler in 2017.  6. Moderate aortic stenosis: Seen on previous echocardiogram in 2016.  On physical exam, she has 2 out of 6 heart murmur.  She does not have any symptoms consistent with critical aortic stenosis.     For questions or updates, please contact [place] Please consult [email or web address]  for contact info under     Hilbert Corrigan, [place]  02/25/2019 1:42 PM   Attending Note:   The patient was seen and examined.  Agree with assessment and plan as noted above.  Changes made to the above note as needed.  Patient seen and independently examined with  Almyra Deforest, PA .   We discussed all aspects of the encounter. I agree with the assessment and plan as stated above.  1.  Pre op evaluation:   Pt has moderate AS and HOCM.    She has not had any symptoms of AS or HCM.  Lives independently,  Does her own chores, cooking .  Walks to [employer].   Exercises on a stepper machine without DOE , cp or syncope.  She is at some increased risk based on being 85 yo but overall, I think she is very healthy for her age. I would place her at moderate risk for abdominal surgery   Aortic stenosis:   Stable,  No symptome  3.  HTN   BP is typically well controlled.   4.  Chronic diastolic chf- stable,      I have spent a total of 40 minutes with patient reviewing hospital  notes , telemetry, EKGs, labs and examining patient as well as establishing an assessment and plan that was discussed with the patient. > 50% of time was spent in direct patient care.    Thayer Headings, Brooke Bonito., MD, [facility] 02/25/2019, 2:07 PM [address]  [place] Pager [phone]

## 2019-02-25 NOTE — Progress Notes (Signed)
PHARMACY - ADULT TOTAL PARENTERAL NUTRITION CONSULT NOTE   Pharmacy Consult for TPN Indication: bowel obstruction  Patient Measurements: Height: 5' (152.4 cm) Weight: 99 lb 3.3 oz (45 kg) IBW/kg (Calculated) : 45.5 TPN AdjBW (KG): 50.8 Body mass index is 19.38 kg/m.  Assessment:  85 year old female admitted 7/26 with increased abdominal pain and nausea for 3 days and found to have high-grade SBO with NG tube placed. Prior to admission, patient lived alone and completed all activities of daily living independently. NGT output increased on 7/30. Patient has been NPO for 6 days here and reports poor PO intake for 3 days prior to admission due to symptoms. Pharmacy consulted for TPN.   GI: High grade SBO- considering continued medication management vs surgery. NGT output 800 mL (decreased from 7/30). LBM 7/29.  Endo: No history of DM. BG 170.  Insulin requirements in the past 24 hours: 0 Lytes: K 3.6 (goal >4), Last Mg on 7/29 was wnl at 2.3 (goal >2). No Phos available.  Renal: SCr 1.04 >>1.31, UOP low at 0.3 as recorded.  Pulm: RA Cards: BP elevated on Coreg.  Hepatobil: LFTs/Tbili wnl on 7/26. No TG available.  Neuro: Pain controlled.  ID: Afebrile, WBC wnl. No antibiotics.   TPN Access: PICC placement 7/31 TPN start date: 7/31 Nutritional Goals (per RD recommendation on pending): kCal: Protein:  Fluid:  Goal TPN rate is ~55 ml/hr (provides 100%)  Current Nutrition:  D5-1/2NS plus 20 KCl at 100 mL/hr.  Sips with medications, ice chips.  Plan:  Initiate TPN at 28 mL/hr. This TPN provides 35.6 g of protein, 114 g of dextrose, and 21.5 g of lipids which provides 746 kCals per day, meeting ~50% of patient needs Electrolytes in TPN: standard for now Add MVI, trace elements M,W,F Start sensitive SSI q4hr and adjust as needed Reduce D5-1/2NS to 70 ml/hr when TPN bag hangs at 1800 PM.  Monitor TPN labs F/U RD recommendations  KCl 21mEq IV x2 runs today.   Sloan Leiter,  PharmD, BCPS, BCCCP Clinical Pharmacist Please refer to [facility] for [place] numbers 02/25/2019,8:26 AM

## 2019-02-25 NOTE — Progress Notes (Signed)
PROGRESS NOTE  ETNA FORQUER IDP:[id number] DOB: 1919/04/02 DOA: 02/20/2019 PCP: Lajean Manes, MD   LOS: 4 days   Brief narrative: Tina Weaver a 85 y.o.femalewith medical history significant ofHTN, chronic diastolic CHF with hypertensive cardiomyopathy, AS. Patient presented to the ED on 7/27 with c/o abd pain for 3 days associated with nausea, generalized weakness.  CT of the abdomen and pelvis demonstrated a high grade SBO. Patient was admitted for small bowel obstruction. General surgery was consulted and patient was started on conservative management with IV fluid, bowel rest, pain medicine and NG tube suction. Repeat abdominal x-ray today 7/30 slight interval increase in the size of the small bowel loops. In last 72 hours, patient is not clinically or radiographically improving from her bowel obstruction.  General surgery had a discussion with patient and her son/POA who wished to continue conservative management for now.  Subjective: Patient was seen and examined this morning.  Elderly Caucasian female, sitting up in chair.  Still continues to have dull aching pain.  Abdominal x-ray from this morning does not show any improvement.  Patient is tearful.  Resolved.  She still is not sure if she would go for surgery.  General surgery following.  Assessment/Plan:  Principal Problem:   SBO (small bowel obstruction) (HCC) Active Problems:   HTN (hypertension)   CKD (chronic kidney disease), stage III (HCC)   Chronic diastolic heart failure (HCC)  High-grade small bowel obstruction -CT scan finding as above. -Currently undergoing conservative management but no significant improvement clinically or radiographically. -Per general surgery note, patient and her son/POA wished to continue conservative management for now.  Surgery to follow-up if there is any change in their decision. -IV fluid changed yesterday, based on electrolyte levels.  Continue pain control.  Continue NG tube to  suction.  Hypernatremia/hyperchloremia/hypokalemia -Yesterday, sodium level was elevated to 148, potassium level was low at 3.4 -I switched fluid to D5-1/2NS with K20.  -Both sodium and potassium level have improved this morning. -Continue current fluid at the same rate @100  mL/h.  Cardiovascular issues: HTN, HLD, chronic diastolic CHF, CKD stage III -Prior to admission, patient was on Coreg 18.75 mg twice daily, Lasix 20 mg daily Crestor 5 mg daily. -Continue Coreg, statin.  Lasix on hold.  Creatinine slightly worse at 1.31 this morning.  Continue fluid, continue to monitor.  Hypothyroidism  -Continue Synthroid.    Mobility: Encourage ambulation Diet: N.p.o, sips with meds DVT prophylaxis: Lovenox Code Status:  Code Status: DNR  Family Communication: Expected Discharge: Pending recovery from bowel obstruction  Consultants:  General surgery  Procedures:  None  Antimicrobials:  Anti-infectives (From admission, onward)   None      Infusions:  . sodium chloride 250 mL (02/23/19 0955)  . dextrose 5 % and 0.45 % NaCl with KCl 20 mEq/L 100 mL/hr at 02/25/19 0524  . dextrose 5 % and 0.45 % NaCl with KCl 20 mEq/L    . potassium chloride 10 mEq (02/25/19 1024)  . TPN ADULT (ION)      Scheduled Meds: . carvedilol  18.75 mg Oral BID WC  . enoxaparin (LOVENOX) injection  30 mg Subcutaneous Q24H  . insulin aspart  0-9 Units Subcutaneous Q4H  . levothyroxine  88 mcg Oral Daily  . rosuvastatin  5 mg Oral q1800  . sodium chloride flush  3 mL Intravenous Once    PRN meds: sodium chloride, acetaminophen **OR** acetaminophen, fentaNYL (SUBLIMAZE) injection, hydrALAZINE, menthol-cetylpyridinium, ondansetron **OR** ondansetron (ZOFRAN) IV, phenol   Objective:  Vitals:   02/25/19 0434 02/25/19 0800  BP: (!) 154/55 (!) 168/62  Pulse: (!) 58 60  Resp: 16 20  Temp: 98.3 F (36.8 C) 97.9 F (36.6 C)  SpO2: 98% 97%    Intake/Output Summary (Last 24 hours) at  02/25/2019 1146 Last data filed at 02/25/2019 0601 Gross per 24 hour  Intake 2301.91 ml  Output 1100 ml  Net 1201.91 ml   Filed Weights   02/21/19 2130 02/22/19 2106 02/25/19 0300  Weight: 50.5 kg 50.5 kg 45 kg   Weight change:  Body mass index is 19.38 kg/m.   Physical Exam: General exam: Elderly frail female, tearful Skin: No rashes, lesions or ulcers. HEENT: Atraumatic, normocephalic, supple neck, no obvious bleeding Lungs: Clear to auscultation bilaterally CVS: Regular rate and rhythm, no murmur GI/Abd soft, mild diffuse tenderness, bowel sounds sluggish CNS: Alert, awake monitor x3 Psychiatry: Mood appropriate Extremities: No pedal edema, no calf tenderness  Data Review: I have personally reviewed the laboratory data and studies available.  Recent Labs  Lab 02/20/19 2253 02/22/19 0722 02/25/19 0519  WBC 13.9* 9.8 10.2  NEUTROABS  --  7.4 7.0  HGB 15.0 13.0 11.5*  HCT 44.0 39.5 35.7*  MCV 97.1 99.7 100.0  PLT 253 211 180   Recent Labs  Lab 02/21/19 0342 02/22/19 0722 02/23/19 0436 02/24/19 0725 02/25/19 0519  NA 137 144 149* 148* 144  K 4.4 3.7 3.4* 3.0* 3.6  CL 103 111 112* 111 111  CO2 22 22 22 23 26   GLUCOSE 129* 100* 114* 90 170*  BUN 21 23 34* 29* 34*  CREATININE 1.00 1.06* 1.26* 1.04* 1.31*  CALCIUM 8.8* 8.5* 8.4* 8.3* 8.0*  MG  --   --  2.3  --   --     Terrilee Croak, MD  [facility] 02/25/2019

## 2019-02-25 NOTE — Progress Notes (Signed)
Peripherally Inserted Central Catheter/Midline Placement  The IV Nurse has discussed with the patient and/or persons authorized to consent for the patient, the purpose of this procedure and the potential benefits and risks involved with this procedure.  The benefits include less needle sticks, lab draws from the catheter, and the patient may be discharged home with the catheter. Risks include, but not limited to, infection, bleeding, blood clot (thrombus formation), and puncture of an artery; nerve damage and irregular heartbeat and possibility to perform a PICC exchange if needed/ordered by physician.  Alternatives to this procedure were also discussed.  Bard Power PICC patient education guide, fact sheet on infection prevention and patient information card has been provided to patient /or left at bedside.    PICC/Midline Placement Documentation  PICC Double Lumen 02/25/19 PICC Left Brachial 37 cm 0 cm (Active)  Indication for Insertion or Continuance of Line Administration of hyperosmolar/irritating solutions (i.e. TPN, Vancomycin, etc.) 02/25/19 1809  Exposed Catheter (cm) 0 cm 02/25/19 1809  Site Assessment Clean;Dry;Intact 02/25/19 1809  Lumen #1 Status Flushed;Blood return noted 02/25/19 1809  Lumen #2 Status Flushed;Blood return noted 02/25/19 1809  Dressing Type Transparent 02/25/19 1809  Dressing Status Dry;Clean;Intact;Antimicrobial disc in place 02/25/19 1809  Dressing Intervention New dressing 02/25/19 1809  Dressing Change Due 03/04/19 02/25/19 1809       Tina Weaver 02/25/2019, 6:11 PM

## 2019-02-26 LAB — BASIC METABOLIC PANEL
Anion gap: 13 (ref 5–15)
BUN: 23 mg/dL (ref 8–23)
CO2: 31 mmol/L (ref 22–32)
Calcium: 8.2 mg/dL — ABNORMAL LOW (ref 8.9–10.3)
Chloride: 96 mmol/L — ABNORMAL LOW (ref 98–111)
Creatinine, Ser: 0.88 mg/dL (ref 0.44–1.00)
GFR calc Af Amer: >60 mL/min (ref >60)
GFR calc non Af Amer: 55 mL/min — ABNORMAL LOW (ref >60)
Glucose, Bld: 126 mg/dL — ABNORMAL HIGH (ref 70–99)
Potassium: 3 mmol/L — ABNORMAL LOW (ref 3.5–5.1)
Sodium: 140 mmol/L (ref 135–145)

## 2019-02-26 LAB — CBC
HCT: 39.4 % (ref 36.0–46.0)
Hemoglobin: 12.6 g/dL (ref 12.0–15.0)
MCH: 32.9 pg (ref 26.0–34.0)
MCHC: 32 g/dL (ref 30.0–36.0)
MCV: 102.9 fL — ABNORMAL HIGH (ref 80.0–100.0)
Platelets: 181 10*3/uL (ref 150–400)
RBC: 3.83 MIL/uL — ABNORMAL LOW (ref 3.87–5.11)
RDW: 14.5 % (ref 11.5–15.5)
WBC: 9.3 10*3/uL (ref 4.0–10.5)
nRBC: 0 % (ref 0.0–0.2)

## 2019-02-26 LAB — DIFFERENTIAL
Abs Immature Granulocytes: 0.04 10*3/uL (ref 0.00–0.07)
Basophils Absolute: 0 10*3/uL (ref 0.0–0.1)
Basophils Relative: 0 %
Eosinophils Absolute: 0.4 10*3/uL (ref 0.0–0.5)
Eosinophils Relative: 4 %
Immature Granulocytes: 0 %
Lymphocytes Relative: 18 %
Lymphs Abs: 1.6 10*3/uL (ref 0.7–4.0)
Monocytes Absolute: 0.9 10*3/uL (ref 0.1–1.0)
Monocytes Relative: 10 %
Neutro Abs: 6.3 10*3/uL (ref 1.7–7.7)
Neutrophils Relative %: 68 %

## 2019-02-26 LAB — COMPREHENSIVE METABOLIC PANEL
ALT: 21 U/L (ref 0–44)
AST: 52 U/L — ABNORMAL HIGH (ref 15–41)
Albumin: 2.9 g/dL — ABNORMAL LOW (ref 3.5–5.0)
Alkaline Phosphatase: 53 U/L (ref 38–126)
Anion gap: 9 (ref 5–15)
BUN: 26 mg/dL — ABNORMAL HIGH (ref 8–23)
CO2: 30 mmol/L (ref 22–32)
Calcium: 8.3 mg/dL — ABNORMAL LOW (ref 8.9–10.3)
Chloride: 104 mmol/L (ref 98–111)
Creatinine, Ser: 1.03 mg/dL — ABNORMAL HIGH (ref 0.44–1.00)
GFR calc Af Amer: 52 mL/min — ABNORMAL LOW (ref >60)
GFR calc non Af Amer: 45 mL/min — ABNORMAL LOW (ref >60)
Glucose, Bld: 146 mg/dL — ABNORMAL HIGH (ref 70–99)
Potassium: 3.2 mmol/L — ABNORMAL LOW (ref 3.5–5.1)
Sodium: 143 mmol/L (ref 135–145)
Total Bilirubin: 0.5 mg/dL (ref 0.3–1.2)
Total Protein: 5.7 g/dL — ABNORMAL LOW (ref 6.5–8.1)

## 2019-02-26 LAB — PREALBUMIN: Prealbumin: 11.3 mg/dL — ABNORMAL LOW (ref 18–38)

## 2019-02-26 LAB — GLUCOSE, CAPILLARY
Glucose-Capillary: 111 mg/dL — ABNORMAL HIGH (ref 70–99)
Glucose-Capillary: 126 mg/dL — ABNORMAL HIGH (ref 70–99)
Glucose-Capillary: 130 mg/dL — ABNORMAL HIGH (ref 70–99)
Glucose-Capillary: 138 mg/dL — ABNORMAL HIGH (ref 70–99)
Glucose-Capillary: 146 mg/dL — ABNORMAL HIGH (ref 70–99)

## 2019-02-26 LAB — MAGNESIUM: Magnesium: 1.9 mg/dL (ref 1.7–2.4)

## 2019-02-26 LAB — PHOSPHORUS
Phosphorus: 2.4 mg/dL — ABNORMAL LOW (ref 2.5–4.6)
Phosphorus: 3.2 mg/dL (ref 2.5–4.6)

## 2019-02-26 LAB — TRIGLYCERIDES: Triglycerides: 90 mg/dL (ref <150)

## 2019-02-26 MED ORDER — FUROSEMIDE 10 MG/ML IJ SOLN
20.0000 mg | Freq: Once | INTRAMUSCULAR | Status: AC
  Administered 2019-02-26: 20 mg via INTRAVENOUS
  Filled 2019-02-26: qty 2

## 2019-02-26 MED ORDER — TRAVASOL 10 % IV SOLN
INTRAVENOUS | Status: AC
  Administered 2019-02-26: 17:00:00 via INTRAVENOUS
  Filled 2019-02-26: qty 356.16

## 2019-02-26 MED ORDER — POTASSIUM CHLORIDE 10 MEQ/100ML IV SOLN
10.0000 meq | INTRAVENOUS | Status: AC
  Administered 2019-02-26 (×3): 10 meq via INTRAVENOUS
  Filled 2019-02-26 (×3): qty 100

## 2019-02-26 MED ORDER — POTASSIUM PHOSPHATES 15 MMOLE/5ML IV SOLN
10.0000 mmol | Freq: Once | INTRAVENOUS | Status: AC
  Administered 2019-02-26: 10 mmol via INTRAVENOUS
  Filled 2019-02-26: qty 3.33

## 2019-02-26 MED ORDER — POTASSIUM CHLORIDE 10 MEQ/100ML IV SOLN
10.0000 meq | INTRAVENOUS | Status: AC
  Administered 2019-02-26 – 2019-02-27 (×4): 10 meq via INTRAVENOUS
  Filled 2019-02-26 (×5): qty 100

## 2019-02-26 MED ORDER — IPRATROPIUM-ALBUTEROL 0.5-2.5 (3) MG/3ML IN SOLN
3.0000 mL | Freq: Four times a day (QID) | RESPIRATORY_TRACT | Status: DC | PRN
  Administered 2019-02-26: 3 mL via RESPIRATORY_TRACT
  Filled 2019-02-26 (×2): qty 3

## 2019-02-26 NOTE — Progress Notes (Signed)
PHARMACY - ADULT TOTAL PARENTERAL NUTRITION CONSULT NOTE   Pharmacy Consult for TPN Indication: bowel obstruction  Patient Measurements: Height: 5' (152.4 cm) Weight: 99 lb 3.3 oz (45 kg) IBW/kg (Calculated) : 45.5 TPN AdjBW (KG): 50.8 Body mass index is 19.38 kg/m.  Assessment:  85 year old female admitted 7/26 with increased abdominal pain and nausea for 3 days and found to have high-grade SBO with NG tube placed. Prior to admission, patient lived alone and completed all activities of daily living independently. NGT output increased on 7/30. Patient has been NPO for 6 days here and reports poor PO intake for 3 days prior to admission due to symptoms. Pharmacy consulted for TPN.   GI: High grade SBO- considering continued medication management vs surgery. NGT output up at 1550 mL. LBM 7/29. Pre-albumin 11.3.  Endo: No history of DM. BG 138-169. Insulin requirements in the past 24 hours: 3 units Lytes: K 3.2 (goal >4; down after lasix given) and Phos low 2.4 - KPhos 88mmol + 3 runs of KCl ordered by MD this AM. Mg down to 1.9. CoCa 9.2.  Renal: SCr 1.31 >>1.03, UOP up 2.4 after Lasix dose on 7/31. BUN 34>>26.  Pulm: RA Cards: BP elevated on Coreg.  Hepatobil: AST up 52, others wnl. Tbili wnl. No TG available.  Neuro: Pain controlled.  ID: Afebrile, WBC wnl. No antibiotics.   TPN Access: PICC placement 7/31 TPN start date: 7/31 Nutritional Goals (per RD recommendation on 7/31): KCal: 1250-1450 Protein: 60-75 Fluid: >/= 1.3L  Goal TPN rate is ~50 ml/hr (provides 70g protein, 185g dextrose, and 39g of lipids meeting 100% of patient's needs)   Current Nutrition:  Sips with medications, ice chips.  Plan:  Continue TPN at 28 mL/hr until lytes are stabilized. This TPN provides 35.6 g of protein, 114 g of dextrose, and 21.5 g of lipids which provides 746 kCals per day, meeting ~59% of patient needs Electrolytes in TPN: increase K, others standard Add MVI, trace elements  M,W,F Continue sensitive SSI q4hr and adjust as needed Monitor TPN labs Repeat BMET and Phos at 1700 PM to replete further if needed.   Sloan Leiter, PharmD, BCPS, BCCCP Clinical Pharmacist Please refer to [facility] for [place] numbers 02/26/2019,8:20 AM

## 2019-02-26 NOTE — Progress Notes (Signed)
Pharmacy note: TPN   85 yo female on TPN. Potassium and Phosphate replaced earlier today. Spoke with C. Bodenheimer NP. -k= 3, Phos= 3.2  Plan -potassium 28meq q1h x 4 doses -Recheck potassium in am  Hildred Laser, PharmD Clinical Pharmacist **Pharmacist phone directory can now be found on [email or web address] ([email or web address]).  Listed under [place].

## 2019-02-26 NOTE — Progress Notes (Signed)
PROGRESS NOTE  Tina Weaver PXT:[id number] DOB: 1919-06-04 DOA: 02/20/2019 PCP: Lajean Manes, MD   LOS: 5 days   Brief narrative: Tina Weaver a 85 y.o.femalewith medical history significant ofHTN, chronic diastolic CHF with hypertensive cardiomyopathy, AS. Patient presentedto the ED on 7/27with c/o abd pain for 3 days associated with nausea, generalized weakness.  CT of the abdomen and pelvis demonstrated a high grade SBO. Patient was admitted for small bowel obstruction. General surgery was consulted andpatient was started on conservative management with IV fluid, bowel rest, pain medicine and NG tube suction. Repeat abdominal x-ray today 7/30 slight interval increase in the size of the small bowel loops. Despite multiple days of conservative methods, patient is not clinically or radiographically improving from her bowel obstruction. General surgery had a discussion with patient and her son/POA who wished to continue conservative management for now.  Subjective: Patient was seen and examined this morning.  Lying down on bed.  Continues to have tolerating abdominal pain, abdominal distention noted.  Continues to have darkish NG tube drainage on suction. On TPN since yesterday.  Assessment/Plan:  Principal Problem:   SBO (small bowel obstruction) (HCC) Active Problems:   HTN (hypertension)   CKD (chronic kidney disease), stage III (HCC)   Chronic diastolic heart failure (HCC)   Malnutrition of moderate degree  High-grade small bowel obstruction -CT scan finding as above. -Currently undergoing conservative management but no significant improvement clinically or radiographically. -Per general surgery note,patient and her son/POA wished to continue conservative management.  Surgery to follow-up with patient's son today.  Patient seen by cardiology for preoperative risk stratification. -Currently on TPN.  Hypernatremia/hyperchloremia/hypokalemia -Levlite abnormalities  secondary to continuous NG tube suction. -Sodium level normal, potassium low at 3.2, phosphorus low at 2.4.  -I ordered for IV potassium replacement 10 mEq x 3 and IV potassium phosphate 10 mmol.  -On TPN.  Cardiovascular issues:HTN,HLD, chronic diastolic CHF,CKD stage III -Prior to admission, patient was on Coreg 18.75 mg twice daily, Lasix 20 mg daily Crestor 5 mg daily. -Continue Coreg, statin. Lasix on hold.  Creatinine better at 1.03 today.    Hypothyroidism -Continue Synthroid.  Mobility:Encourage ambulation Diet:N.p.o, sips with meds DVT prophylaxis:Lovenox Code Status:Code Status: DNR Family Communication: Expected Discharge:Pending recovery from bowel obstruction  Consultants:  General surgery  Procedures:  None   Antimicrobials:  Anti-infectives (From admission, onward)   None      Infusions:  . sodium chloride 250 mL (02/23/19 0955)  . potassium chloride 10 mEq (02/26/19 0909)  . potassium PHOSPHATE IVPB (in mmol) 10 mmol (02/26/19 0902)  . TPN ADULT (ION) 28 mL/hr at 02/25/19 1822  . TPN ADULT (ION)      Scheduled Meds: . carvedilol  18.75 mg Oral BID WC  . enoxaparin (LOVENOX) injection  30 mg Subcutaneous Q24H  . insulin aspart  0-9 Units Subcutaneous Q4H  . levothyroxine  88 mcg Oral Daily  . rosuvastatin  5 mg Oral q1800  . sodium chloride flush  3 mL Intravenous Once    PRN meds: sodium chloride, acetaminophen **OR** acetaminophen, fentaNYL (SUBLIMAZE) injection, hydrALAZINE, menthol-cetylpyridinium, ondansetron **OR** ondansetron (ZOFRAN) IV, phenol, sodium chloride flush   Objective: Vitals:   02/26/19 0900 02/26/19 0923  BP:  (!) 178/67  Pulse: 60 (!) 59  Resp:  (!) 21  Temp:  97.9 F (36.6 C)  SpO2:  100%    Intake/Output Summary (Last 24 hours) at 02/26/2019 1020 Last data filed at 02/26/2019 0600 Gross per 24 hour  Intake 664.8  ml  Output 4000 ml  Net -3335.2 ml   Filed Weights   02/21/19 2130  02/22/19 2106 02/25/19 0300  Weight: 50.5 kg 50.5 kg 45 kg   Weight change:  Body mass index is 19.38 kg/m.   Physical Exam: General exam: Appears calm and comfortable.  Skin: No rashes, lesions or ulcers. HEENT: Atraumatic, normocephalic, supple neck, no obvious bleeding Lungs: Clear to auscultation bilaterally CVS: Regular rate and rhythm, no murmur GI/Abd, distended, tympanic diet diffuse tenderness, bowel sounds sluggish. CNS: Alert, awake oriented x3 Psychiatry: Depressed mood Extremities: No pedal edema, no calf tenderness  Data Review: I have personally reviewed the laboratory data and studies available.  Recent Labs  Lab 02/20/19 2253 02/22/19 0722 02/25/19 0519 02/26/19 0345  WBC 13.9* 9.8 10.2 9.3  NEUTROABS  --  7.4 7.0 6.3  HGB 15.0 13.0 11.5* 12.6  HCT 44.0 39.5 35.7* 39.4  MCV 97.1 99.7 100.0 102.9*  PLT 253 211 180 181   Recent Labs  Lab 02/22/19 0722 02/23/19 0436 02/24/19 0725 02/25/19 0519 02/26/19 0345  NA 144 149* 148* 144 143  K 3.7 3.4* 3.0* 3.6 3.2*  CL 111 112* 111 111 104  CO2 22 22 23 26 30   GLUCOSE 100* 114* 90 170* 146*  BUN 23 34* 29* 34* 26*  CREATININE 1.06* 1.26* 1.04* 1.31* 1.03*  CALCIUM 8.5* 8.4* 8.3* 8.0* 8.3*  MG  --  2.3  --   --  1.9  PHOS  --   --   --   --  2.4*    Tina Croak, MD  [facility] 02/26/2019

## 2019-02-26 NOTE — Progress Notes (Addendum)
0900 Son called and was updated regarding pt's condition.   [place] regarding pt's breathing, respirations are even but labored and pt is using accessory muscles. VSS and pt is able to communicate/follow commands but is not able to speak a full sentence due to shortness of breath. Pt was given 20 mg Lasix IV per MD order. MD stated he would order duoneb.   1600- pt is resting comfortably in the bed, respirations even and unlabored.     30- Son called again to ask if surgery could call him because he had some question regarding options. This RN paged surgery. Also notified MD Dahal via secure message that this RN was unable to administer PO meds since pt is too drowsy, deemed unsafe.   [facility] on call service, left name of son and number so surgeon could discuss options.   Paulla Fore, RN .

## 2019-02-26 NOTE — Progress Notes (Signed)
Subjective: CC: Abdominal pain No progress.    Objective: Vital signs in last 24 hours: Temp:  [97.7 F (36.5 C)-97.9 F (36.6 C)] 97.9 F (36.6 C) (08/01 0923) Pulse Rate:  [55-60] 59 (08/01 0923) Resp:  [20-28] 21 (08/01 0923) BP: (146-178)/(55-67) 178/67 (08/01 0923) SpO2:  [93 %-100 %] 100 % (08/01 0923) Last BM Date: 02/23/19  Intake/Output from previous day: 07/31 0701 - 08/01 0700 In: 664.8 [I.V.:664.8] Out: 4100 [Urine:2550; Emesis/NG output:1550] Intake/Output this shift: No intake/output data recorded.  PE: WCB:JSEGB, NAD, pleasant Pulm:Normal rate andeffort normal TDV:VOHYWVPXT but soft. Generalized tenderness without peritonitis. Hypoactive bowel sounds. NG tube w Bilious output in cannister.  NG tube rehooked to [employer].  Ext: No LEedema  Lab Results:  Recent Labs    02/25/19 0519 02/26/19 0345  WBC 10.2 9.3  HGB 11.5* 12.6  HCT 35.7* 39.4  PLT 180 181   BMET Recent Labs    02/25/19 0519 02/26/19 0345  NA 144 143  K 3.6 3.2*  CL 111 104  CO2 26 30  GLUCOSE 170* 146*  BUN 34* 26*  CREATININE 1.31* 1.03*  CALCIUM 8.0* 8.3*   PT/INR No results for input(s): LABPROT, INR in the last 72 hours. CMP     Component Value Date/Time   NA 143 02/26/2019 0345   K 3.2 (L) 02/26/2019 0345   CL 104 02/26/2019 0345   CO2 30 02/26/2019 0345   GLUCOSE 146 (H) 02/26/2019 0345   BUN 26 (H) 02/26/2019 0345   CREATININE 1.03 (H) 02/26/2019 0345   CREATININE 1.00 (H) 05/02/2016 0830   CALCIUM 8.3 (L) 02/26/2019 0345   PROT 5.7 (L) 02/26/2019 0345   ALBUMIN 2.9 (L) 02/26/2019 0345   AST 52 (H) 02/26/2019 0345   ALT 21 02/26/2019 0345   ALKPHOS 53 02/26/2019 0345   BILITOT 0.5 02/26/2019 0345   GFRNONAA 45 (L) 02/26/2019 0345   GFRAA 52 (L) 02/26/2019 0345   Lipase     Component Value Date/Time   LIPASE 34 02/20/2019 2253       Studies/Results: Dg Abd Portable 1v  Result Date: 02/25/2019 CLINICAL DATA:  Small-bowel obstruction.  EXAM: PORTABLE ABDOMEN - 1 VIEW COMPARISON:  02/24/2019. CT 02/21/2019. FINDINGS: NG tube stable position. Persistent distention of small bowel loops without significant interim change. Findings consistent small bowel obstruction. No free air. Stable calcific density in the left lower pelvis consistent. No acute bony abnormality. IMPRESSION: 1.  NG tube stable position. 2. Persistent positions small-bowel loops without significant interim change. Findings consistent small bowel obstruction. No free air. Electronically Signed   By: Marcello Moores  Register   On: 02/25/2019 05:56   [place] Ekg Site Rite  Result Date: 02/25/2019 If Site Rite image not attached, placement could not be confirmed due to current cardiac rhythm.   Anti-infectives: Anti-infectives (From admission, onward)   None       Assessment/Plan HTN CHF  SBO - Hx appendectomy and abdominal hysterctomy  - Keep Mg > 2 and K > 4 for bowel function - Mobilize as able for bowel function.PT working with patient   FEN -TPN. Replace K. VTE -SCD, Lovenox ID -None POC- Millianna Szymborski (Son) [phone]. Viona Gilmore            [facility]) [phone], [phone]  See yesterday's note- I updated her son Dorothyann Peng) about how she is doing today and we again discussed the options going forward. Difficult decision and he is not ready to commit to surgical  exploration. Dr. Redmond Pulling discussed surgery and risks with him extensively yesterday and I reiterated some of that. We discussed that non-operative treatment would essentially be palliative/ comfort care, g tube would alleviate nausea but would not resolve the problem and she would be dependent on IV nutrition/hydration, and I would not anticipate she could return to independent living in that scenario. He will call when he has decided, otherwise I will call him back tomorrow morning.                  LOS: 5 days    Clovis Riley , MD [facility] 02/26/2019, 9:31 AM   Please see amion to contact on-call provider if needed

## 2019-02-27 LAB — GLUCOSE, CAPILLARY
Glucose-Capillary: 106 mg/dL — ABNORMAL HIGH (ref 70–99)
Glucose-Capillary: 120 mg/dL — ABNORMAL HIGH (ref 70–99)
Glucose-Capillary: 124 mg/dL — ABNORMAL HIGH (ref 70–99)
Glucose-Capillary: 126 mg/dL — ABNORMAL HIGH (ref 70–99)
Glucose-Capillary: 138 mg/dL — ABNORMAL HIGH (ref 70–99)
Glucose-Capillary: 143 mg/dL — ABNORMAL HIGH (ref 70–99)

## 2019-02-27 LAB — CBC WITH DIFFERENTIAL/PLATELET
Abs Immature Granulocytes: 0.05 10*3/uL (ref 0.00–0.07)
Basophils Absolute: 0 10*3/uL (ref 0.0–0.1)
Basophils Relative: 0 %
Eosinophils Absolute: 0.4 10*3/uL (ref 0.0–0.5)
Eosinophils Relative: 4 %
HCT: 38.8 % (ref 36.0–46.0)
Hemoglobin: 12.7 g/dL (ref 12.0–15.0)
Immature Granulocytes: 1 %
Lymphocytes Relative: 18 %
Lymphs Abs: 1.8 10*3/uL (ref 0.7–4.0)
MCH: 34.2 pg — ABNORMAL HIGH (ref 26.0–34.0)
MCHC: 32.7 g/dL (ref 30.0–36.0)
MCV: 104.6 fL — ABNORMAL HIGH (ref 80.0–100.0)
Monocytes Absolute: 0.9 10*3/uL (ref 0.1–1.0)
Monocytes Relative: 9 %
Neutro Abs: 6.8 10*3/uL (ref 1.7–7.7)
Neutrophils Relative %: 68 %
Platelets: 147 10*3/uL — ABNORMAL LOW (ref 150–400)
RBC: 3.71 MIL/uL — ABNORMAL LOW (ref 3.87–5.11)
RDW: 14.6 % (ref 11.5–15.5)
WBC: 9.9 10*3/uL (ref 4.0–10.5)
nRBC: 0 % (ref 0.0–0.2)

## 2019-02-27 LAB — BASIC METABOLIC PANEL
Anion gap: 9 (ref 5–15)
Anion gap: 8 (ref 5–15)
BUN: 24 mg/dL — ABNORMAL HIGH (ref 8–23)
BUN: 24 mg/dL — ABNORMAL HIGH (ref 8–23)
CO2: 32 mmol/L (ref 22–32)
CO2: 33 mmol/L — ABNORMAL HIGH (ref 22–32)
Calcium: 8.4 mg/dL — ABNORMAL LOW (ref 8.9–10.3)
Calcium: 8.3 mg/dL — ABNORMAL LOW (ref 8.9–10.3)
Chloride: 97 mmol/L — ABNORMAL LOW (ref 98–111)
Chloride: 101 mmol/L (ref 98–111)
Creatinine, Ser: 1.01 mg/dL — ABNORMAL HIGH (ref 0.44–1.00)
Creatinine, Ser: 0.93 mg/dL (ref 0.44–1.00)
GFR calc Af Amer: 59 mL/min — ABNORMAL LOW (ref >60)
GFR calc non Af Amer: 51 mL/min — ABNORMAL LOW (ref >60)
Glucose, Bld: 488 mg/dL — ABNORMAL HIGH (ref 70–99)
Glucose, Bld: 138 mg/dL — ABNORMAL HIGH (ref 70–99)
Potassium: 5.8 mmol/L — ABNORMAL HIGH (ref 3.5–5.1)
Potassium: 3.5 mmol/L (ref 3.5–5.1)
Sodium: 138 mmol/L (ref 135–145)
Sodium: 142 mmol/L (ref 135–145)

## 2019-02-27 LAB — PHOSPHORUS
Phosphorus: 4.3 mg/dL (ref 2.5–4.6)
Phosphorus: 3.2 mg/dL (ref 2.5–4.6)

## 2019-02-27 MED ORDER — CEFAZOLIN SODIUM-DEXTROSE 2-4 GM/100ML-% IV SOLN
2.0000 g | Freq: Once | INTRAVENOUS | Status: AC
  Administered 2019-02-27: 2 g via INTRAVENOUS
  Filled 2019-02-27: qty 100

## 2019-02-27 MED ORDER — ORAL CARE MOUTH RINSE
15.0000 mL | Freq: Two times a day (BID) | OROMUCOSAL | Status: DC
  Administered 2019-02-27 – 2019-03-11 (×23): 15 mL via OROMUCOSAL

## 2019-02-27 MED ORDER — POTASSIUM CHLORIDE 10 MEQ/100ML IV SOLN
10.0000 meq | INTRAVENOUS | Status: AC
  Administered 2019-02-27 (×4): 10 meq via INTRAVENOUS
  Filled 2019-02-27 (×4): qty 100

## 2019-02-27 MED ORDER — METOPROLOL TARTRATE 5 MG/5ML IV SOLN: 2.5000 mg | Freq: Four times a day (QID) | INTRAVENOUS | Status: DC | PRN

## 2019-02-27 MED ORDER — TRAVASOL 10 % IV SOLN
INTRAVENOUS | Status: AC
  Administered 2019-02-27: 17:00:00 via INTRAVENOUS
  Filled 2019-02-27: qty 636

## 2019-02-27 NOTE — Progress Notes (Signed)
PHARMACY - ADULT TOTAL PARENTERAL NUTRITION CONSULT NOTE   Pharmacy Consult for TPN Indication: bowel obstruction  Patient Measurements: Height: 5' (152.4 cm) Weight: 99 lb 3.3 oz (45 kg) IBW/kg (Calculated) : 45.5 TPN AdjBW (KG): 50.8 Body mass index is 19.38 kg/m.  Assessment:  85 year old female admitted 7/26 with increased abdominal pain and nausea for 3 days and found to have high-grade SBO with NG tube placed. Prior to admission, patient lived alone and completed all activities of daily living independently. NGT output increased on 7/30. Patient has been NPO for 6 days here and reports poor PO intake for 3 days prior to admission due to symptoms. Pharmacy consulted for TPN.    *Initial lab work today was contaminated with TPN. Using repeat labs.   GI: High grade SBO- considering continued medication management vs surgery. NGT output up at 1550 mL. LBM 7/29. Pre-albumin 11.3.  Endo: No history of DM. BG 106-138. *488 in error -TPN contaminated lab. Insulin requirements in the past 24 hours: 4 units Lytes: K 3.5 (goal >4; lasix given last 2 days)-will replace and increase in TPN. *K of 5.8 error-TPN contaminated lab. Phos 3.2-stabilized. Mg 1.9 on 8/1. CoCa 9.2. CO2 up 33 s/p Lasix doses.  Renal: SCr 0.93, UOP up 2.4 after Lasix dose on 7/31. BUN 26>>24.  Pulm: RA  Cards: BP elevated on Coreg. HR 60s.  Hepatobil: AST up 52, others wnl. Tbili wnl. No TG available.  Neuro: Pain controlled.  ID: Afebrile, WBC wnl. No antibiotics.   TPN Access: PICC placement 7/31 TPN start date: 7/31 Nutritional Goals (per RD recommendation on 7/31): KCal: 1250-1450 Protein: 60-75 Fluid: >/= 1.3L  Goal TPN rate is ~50 ml/hr (provides 70g protein, 185g dextrose, and 39g of lipids and 1332 kcals meeting 100% of patient's needs)   Current Nutrition:  Sips with medications, ice chips.  Plan:  Increase TPN to goal 50 mL/hr as lytes stabilizing. This TPN provides 70 g of protein, 185 g of  dextrose, and 39 g of lipids which provides 1332 kCals per day, meeting 100% of patient needs Electrolytes in TPN: increase K, others standard. Cl:Ace 2:1 for now Add MVI, trace elements M,W,F Continue sensitive SSI q4hr and adjust as needed Monitor TPN labs -watch lytes closely, at risk for refeeding.   KCl 10 mEq IV x4 today.    Sloan Leiter, PharmD, BCPS, BCCCP Clinical Pharmacist Please refer to [facility] for [place] numbers 02/27/2019,7:29 AM

## 2019-02-27 NOTE — Progress Notes (Signed)
       Subjective: CC: Abdominal pain No progress.    Objective: Vital signs in last 24 hours: Temp:  [97.6 F (36.4 C)-97.9 F (36.6 C)] 97.9 F (36.6 C) (08/02 0616) Pulse Rate:  [59-65] 62 (08/02 0616) Resp:  [16-40] 16 (08/01 2313) BP: (146-178)/(60-87) 148/68 (08/02 0616) SpO2:  [94 %-100 %] 96 % (08/02 0616) Last BM Date: 02/23/19  Intake/Output from previous day: 08/01 0701 - 08/02 0700 In: 800.2 [P.O.:60; I.V.:360.8; IV Piggyback:379.4] Out: 3875 [Urine:2625; Emesis/NG output:1250] Intake/Output this shift: No intake/output data recorded.  PE: ZOX:WRUEA, NAD, pleasant Pulm:Normal rate andeffort normal VWU:JWJXBJYNW but soft. Nontender. Hypoactive bowel sounds. NG tube w dark Bilious output in cannister.  NG tube rehooked to [employer].  Ext: No LEedema  Lab Results:  Recent Labs    02/26/19 0345 02/27/19 0419  WBC 9.3 9.9  HGB 12.6 12.7  HCT 39.4 38.8  PLT 181 147*   BMET Recent Labs    02/26/19 2000 02/27/19 0419  NA 140 138  K 3.0* 5.8*  CL 96* 97*  CO2 31 32  GLUCOSE 126* 488*  BUN 23 24*  CREATININE 0.88 1.01*  CALCIUM 8.2* 8.4*   PT/INR No results for input(s): LABPROT, INR in the last 72 hours. CMP     Component Value Date/Time   NA 138 02/27/2019 0419   K 5.8 (H) 02/27/2019 0419   CL 97 (L) 02/27/2019 0419   CO2 32 02/27/2019 0419   GLUCOSE 488 (H) 02/27/2019 0419   BUN 24 (H) 02/27/2019 0419   CREATININE 1.01 (H) 02/27/2019 0419   CREATININE 1.00 (H) 05/02/2016 0830   CALCIUM 8.4 (L) 02/27/2019 0419   PROT 5.7 (L) 02/26/2019 0345   ALBUMIN 2.9 (L) 02/26/2019 0345   AST 52 (H) 02/26/2019 0345   ALT 21 02/26/2019 0345   ALKPHOS 53 02/26/2019 0345   BILITOT 0.5 02/26/2019 0345   GFRNONAA NOT CALCULATED 02/27/2019 0419   GFRAA NOT CALCULATED 02/27/2019 0419   Lipase     Component Value Date/Time   LIPASE 34 02/20/2019 2253       Studies/Results: No results found.  Anti-infectives: Anti-infectives (From admission,  onward)   None       Assessment/Plan HTN CHF  SBO - Hx appendectomy and abdominal hysterctomy  - Keep Mg > 2 and K > 4 for bowel function - Mobilize as able for bowel function.PT working with patient   FEN -TPN. Replace K. VTE -SCD, Lovenox ID -None POC- Bryla Burek (Son) [phone]. Viona Gilmore            [facility] [place]) [phone], [phone]  Her two sons are meeting, Richardson Landry has [place] in from [place] of [place]. I will call them later this morning to talk again with them about the plan going forward.      LOS: 6 days    Clovis Riley , MD [facility] 02/27/2019, 9:04 AM  Please see amion to contact on-call provider if needed

## 2019-02-27 NOTE — Progress Notes (Signed)
PROGRESS NOTE  Tina Weaver KDT:[id number] DOB: 30-Dec-1918 DOA: 02/20/2019 PCP: Lajean Manes, MD   LOS: 6 days   Brief narrative: Tina Cimino Bookeris a 85 y.o.femalewith medical history significant ofHTN, chronic diastolic CHF with hypertensive cardiomyopathy, AS. Patient presentedto the ED on 7/27with c/o abd pain for 3 days associated with nausea, generalized weakness.  CT of the abdomen and pelvis demonstrated a high grade SBO. Patient was admitted for small bowel obstruction. General surgery was consulted andpatient was started on conservative management with IV fluid, bowel rest, pain medicine and NG tube suction. Repeat abdominal x-ray today 7/30 slight interval increase in the size of the small bowel loops. Despite multiple days of conservative methods, patient is not clinically or radiographically improving from her bowel obstruction. General surgery had a discussion with patient and her son/POA who wished to continue conservative management for now.  Subjective: Patient was seen and examined this morning.  Pleasant elderly Caucasian female.  Drowsy this morning.  Not in pain.  He still has distended belly.  NG tube attached to suction with dark drainage. Yesterday, patient had to be given Lasix IV because of shortness of breath.  Patient is currently on TPN and electrolyte replacement IV. Chart reviewed. No fever.  Blood pressure in 140s and 150s mostly. Labs showed normal creatinine at 0.93 today.  Sodium 142, potassium 3.5, phosphorus 3.2.  Assessment/Plan:  Principal Problem:   SBO (small bowel obstruction) (HCC) Active Problems:   HTN (hypertension)   CKD (chronic kidney disease), stage III (HCC)   Chronic diastolic heart failure (HCC)   Malnutrition of moderate degree  High-grade small bowel obstruction -CT scan finding as above. -Currentlyundergoing conservativemanagement but no significant improvement clinically or radiographically. -Per general surgery  note,patient and her son/POA wished to continue conservative management.  Surgery to follow-up with patient's son today as well.  Patient was seen by cardiology for preoperative risk stratification. -Currently on TPN.  Hypernatremia/hyperchloremia/hypokalemia -Electrolyte abnormalities secondary to continuous NG tube suction. -During the hospitalization, her sodium level increased, potassium and phosphorus level dropped.  Corrected with IV fluid choice and electrolyte replacement. -Normal sodium, potassium and phosphorus level this morning. -On TPN.  Continue to monitor electrolytes.  Cardiovascular issues:HTN,HLD, chronic diastolic CHF,CKD stage III -Prior to admission, patient was on Coreg 18.75 mg twice daily, Lasix 20 mg daily and Crestor 5 mg daily. -Continue Coreg, statin. Lasix on hold.Creatinine better at 0.93 today.  Hypothyroidism -Continue Synthroid.  Mobility:Encourage ambulation Diet:N.p.o, sips with meds DVT prophylaxis:Lovenox Code Status:Code Status: DNR Family Communication: Expected Discharge:Pending recovery from bowel obstruction  Consultants:  General surgery  Procedures:  None  Antimicrobials:  Anti-infectives (From admission, onward)   None      Infusions:  . sodium chloride 250 mL (02/23/19 0955)  . potassium chloride    . TPN ADULT (ION) 28 mL/hr at 02/27/19 0200  . TPN ADULT (ION)      Scheduled Meds: . enoxaparin (LOVENOX) injection  30 mg Subcutaneous Q24H  . insulin aspart  0-9 Units Subcutaneous Q4H  . levothyroxine  88 mcg Oral Daily  . mouth rinse  15 mL Mouth Rinse BID  . rosuvastatin  5 mg Oral q1800  . sodium chloride flush  3 mL Intravenous Once    PRN meds: sodium chloride, acetaminophen **OR** acetaminophen, fentaNYL (SUBLIMAZE) injection, hydrALAZINE, ipratropium-albuterol, menthol-cetylpyridinium, metoprolol tartrate, ondansetron **OR** ondansetron (ZOFRAN) IV, phenol, sodium chloride flush    Objective: Vitals:   02/27/19 0616 02/27/19 0919  BP: (!) 148/68 (!) 158/50  Pulse:  62 65  Resp:  18  Temp: 97.9 F (36.6 C) 98.3 F (36.8 C)  SpO2: 96% 96%    Intake/Output Summary (Last 24 hours) at 02/27/2019 1023 Last data filed at 02/27/2019 0600 Gross per 24 hour  Intake 800.18 ml  Output 3500 ml  Net -2699.82 ml   Filed Weights   02/21/19 2130 02/22/19 2106 02/25/19 0300  Weight: 50.5 kg 50.5 kg 45 kg   Weight change:  Body mass index is 19.38 kg/m.   Physical Exam: General exam: Appears calm and comfortable.  Drowsy.  Not in distress Skin: No rashes, lesions or ulcers. HEENT: Atraumatic, normocephalic, supple neck, no obvious bleeding Lungs: Poor respiratory effort, diminished breath sound both bases.  Clear to auscultation otherwise. CVS: Regular rate and rhythm, no murmur GI/Abd soft, distended, tympanic to control present, bowel sounds minimal CNS: Opens eyes on verbal command, oriented to place person and time Psychiatry: Depressed mood Extremities: No pedal edema, no calf tenderness  Data Review: I have personally reviewed the laboratory data and studies available.  Recent Labs  Lab 02/20/19 2253 02/22/19 0722 02/25/19 0519 02/26/19 0345 02/27/19 0419  WBC 13.9* 9.8 10.2 9.3 9.9  NEUTROABS  --  7.4 7.0 6.3 6.8  HGB 15.0 13.0 11.5* 12.6 12.7  HCT 44.0 39.5 35.7* 39.4 38.8  MCV 97.1 99.7 100.0 102.9* 104.6*  PLT 253 211 180 181 147*   Recent Labs  Lab 02/23/19 0436  02/25/19 0519 02/26/19 0345 02/26/19 2000 02/27/19 0419 02/27/19 0825  NA 149*   < > 144 143 140 138 142  K 3.4*   < > 3.6 3.2* 3.0* 5.8* 3.5  CL 112*   < > 111 104 96* 97* 101  CO2 22   < > 26 30 31  32 33*  GLUCOSE 114*   < > 170* 146* 126* 488* 138*  BUN 34*   < > 34* 26* 23 24* 24*  CREATININE 1.26*   < > 1.31* 1.03* 0.88 1.01* 0.93  CALCIUM 8.4*   < > 8.0* 8.3* 8.2* 8.4* 8.3*  MG 2.3  --   --  1.9  --   --   --   PHOS  --   --   --  2.4* 3.2 4.3 3.2   < > = values in this  interval not displayed.    Terrilee Croak, MD  [facility] 02/27/2019

## 2019-02-27 NOTE — Progress Notes (Signed)
Two sons, Richardson Landry and Dorothyann Peng, are at the bedside visiting the patient.  Pt is up in the chair, denies any pain.   Paulla Fore, RN.

## 2019-02-27 NOTE — Progress Notes (Signed)
I spoke with both her sons, Dorothyann Peng and Richardson Landry. They have considered the options, possible outcomes and risks, and have had their questions answered. At this time they wish for [place] to proceed with diagnostic laparoscopy to attempt to relieve the obstruction. Discussed again risks both in the immediate post-op period, risk of needing SNF or rehab, ileus, failure to return to full functional status, etc. Risk of needing to convert to open surgery discussed. If successful, we discussed this would allow the patient to ultimately be able to eat and this preserve some quality of life. We discussed this would be scheduled with Dr. Rosendo Gros tomorrow morning.

## 2019-02-27 NOTE — Anesthesia Preprocedure Evaluation (Addendum)
Anesthesia Evaluation  Patient identified by MRN, date of birth, ID band Patient awake    Reviewed: Allergy & Precautions, NPO status , Patient's Chart, lab work & pertinent test results, reviewed documented beta blocker date and time   History of Anesthesia Complications Negative for: history of anesthetic complications  Airway Mallampati: II  TM Distance: >3 FB Neck ROM: Full    Dental  (+) Missing,    Pulmonary neg pulmonary ROS,   Tachypneic in high-20s  breath sounds clear to auscultation       Cardiovascular hypertension, Pt. on home beta blockers and Pt. on medications +CHF   Rhythm:Regular Rate:Normal + Systolic murmurs TTE 5643: severe LVH, EF 32-95%, grade 1 diastolic dysfunction, moderate aortic stenosis    Neuro/Psych negative neurological ROS     GI/Hepatic Neg liver ROS, SBO   Endo/Other  Hypothyroidism   Renal/GU Renal InsufficiencyRenal disease     Musculoskeletal negative musculoskeletal ROS (+)   Abdominal   Peds  Hematology negative hematology ROS (+)   Anesthesia Other Findings Day of surgery medications reviewed with the patient.  Reproductive/Obstetrics                            Anesthesia Physical Anesthesia Plan  ASA: III  Anesthesia Plan: General   Post-op Pain Management:    Induction: Intravenous, Rapid sequence and Cricoid pressure planned  PONV Risk Score and Plan: 4 or greater and Treatment may vary due to age or medical condition and Ondansetron  Airway Management Planned: Oral ETT  Additional Equipment: Arterial line  Intra-op Plan:   Post-operative Plan: Extubation in OR  Informed Consent: I have reviewed the patients History and Physical, chart, labs and discussed the procedure including the risks, benefits and alternatives for the proposed anesthesia with the patient or authorized representative who has indicated his/her understanding and  acceptance.   Patient has DNR.  Discussed DNR with power of attorney and Suspend DNR.   Dental advisory given, Consent reviewed with POA and History available from chart only  Plan Discussed with: CRNA  Anesthesia Plan Comments:       Anesthesia Quick Evaluation

## 2019-02-27 NOTE — Progress Notes (Signed)
Notified MD Dahal via secure chat of pt's BP 191/57 and that this RN administered 10 mg hydralazine IV per PRN orders.   Paulla Fore, RN.

## 2019-02-28 ENCOUNTER — Encounter (HOSPITAL_COMMUNITY): Payer: Self-pay | Admitting: Surgery

## 2019-02-28 ENCOUNTER — Inpatient Hospital Stay (HOSPITAL_COMMUNITY): Payer: Medicare HMO | Admitting: Anesthesiology

## 2019-02-28 ENCOUNTER — Encounter (HOSPITAL_COMMUNITY)
Admission: EM | Disposition: A | Discharge status: Hospice | Payer: Self-pay | Source: Home / Self Care | Attending: Internal Medicine

## 2019-02-28 HISTORY — PX: LYSIS OF ADHESION: SHX5961

## 2019-02-28 HISTORY — PX: LAPAROSCOPY: SHX197

## 2019-02-28 LAB — CBC
HCT: 39.8 % (ref 36.0–46.0)
Hemoglobin: 12.8 g/dL (ref 12.0–15.0)
MCH: 32.7 pg (ref 26.0–34.0)
MCHC: 32.2 g/dL (ref 30.0–36.0)
MCV: 101.8 fL — ABNORMAL HIGH (ref 80.0–100.0)
Platelets: 186 10*3/uL (ref 150–400)
RBC: 3.91 MIL/uL (ref 3.87–5.11)
RDW: 14.4 % (ref 11.5–15.5)
WBC: 11.2 10*3/uL — ABNORMAL HIGH (ref 4.0–10.5)
nRBC: 0 % (ref 0.0–0.2)

## 2019-02-28 LAB — DIFFERENTIAL
Abs Immature Granulocytes: 0.04 10*3/uL (ref 0.00–0.07)
Basophils Absolute: 0 10*3/uL (ref 0.0–0.1)
Basophils Relative: 0 %
Eosinophils Absolute: 0.4 10*3/uL (ref 0.0–0.5)
Eosinophils Relative: 3 %
Immature Granulocytes: 0 %
Lymphocytes Relative: 19 %
Lymphs Abs: 2.1 10*3/uL (ref 0.7–4.0)
Monocytes Absolute: 1.2 10*3/uL — ABNORMAL HIGH (ref 0.1–1.0)
Monocytes Relative: 11 %
Neutro Abs: 7.4 10*3/uL (ref 1.7–7.7)
Neutrophils Relative %: 67 %

## 2019-02-28 LAB — GLUCOSE, CAPILLARY
Glucose-Capillary: 105 mg/dL — ABNORMAL HIGH (ref 70–99)
Glucose-Capillary: 117 mg/dL — ABNORMAL HIGH (ref 70–99)
Glucose-Capillary: 125 mg/dL — ABNORMAL HIGH (ref 70–99)
Glucose-Capillary: 142 mg/dL — ABNORMAL HIGH (ref 70–99)
Glucose-Capillary: 143 mg/dL — ABNORMAL HIGH (ref 70–99)
Glucose-Capillary: 159 mg/dL — ABNORMAL HIGH (ref 70–99)

## 2019-02-28 LAB — MAGNESIUM: Magnesium: 1.8 mg/dL (ref 1.7–2.4)

## 2019-02-28 LAB — COMPREHENSIVE METABOLIC PANEL WITH GFR
ALT: 18 U/L (ref 0–44)
AST: 31 U/L (ref 15–41)
Albumin: 2.5 g/dL — ABNORMAL LOW (ref 3.5–5.0)
Alkaline Phosphatase: 54 U/L (ref 38–126)
Anion gap: 9 (ref 5–15)
BUN: 28 mg/dL — ABNORMAL HIGH (ref 8–23)
CO2: 31 mmol/L (ref 22–32)
Calcium: 8.4 mg/dL — ABNORMAL LOW (ref 8.9–10.3)
Chloride: 103 mmol/L (ref 98–111)
Creatinine, Ser: 1 mg/dL (ref 0.44–1.00)
GFR calc Af Amer: 54 mL/min — ABNORMAL LOW
GFR calc non Af Amer: 47 mL/min — ABNORMAL LOW
Glucose, Bld: 91 mg/dL (ref 70–99)
Potassium: 3.6 mmol/L (ref 3.5–5.1)
Sodium: 143 mmol/L (ref 135–145)
Total Bilirubin: 0.5 mg/dL (ref 0.3–1.2)
Total Protein: 5.6 g/dL — ABNORMAL LOW (ref 6.5–8.1)

## 2019-02-28 LAB — SURGICAL PCR SCREEN
MRSA, PCR: NEGATIVE
Staphylococcus aureus: NEGATIVE

## 2019-02-28 LAB — TYPE AND SCREEN
ABO/RH(D): A POS
Antibody Screen: NEGATIVE

## 2019-02-28 LAB — TRIGLYCERIDES: Triglycerides: 75 mg/dL

## 2019-02-28 LAB — PHOSPHORUS: Phosphorus: 3 mg/dL (ref 2.5–4.6)

## 2019-02-28 LAB — ABO/RH: ABO/RH(D): A POS

## 2019-02-28 LAB — PREALBUMIN: Prealbumin: 10.4 mg/dL — ABNORMAL LOW (ref 18–38)

## 2019-02-28 SURGERY — LAPAROSCOPY, DIAGNOSTIC
Anesthesia: General | Site: Abdomen

## 2019-02-28 MED ORDER — ARTIFICIAL TEARS OPHTHALMIC OINT
TOPICAL_OINTMENT | OPHTHALMIC | Status: DC | PRN
  Administered 2019-02-28: 2 via OPHTHALMIC

## 2019-02-28 MED ORDER — SUCCINYLCHOLINE CHLORIDE 20 MG/ML IJ SOLN
INTRAMUSCULAR | Status: DC | PRN
  Administered 2019-02-28: 80 mg via INTRAVENOUS

## 2019-02-28 MED ORDER — PHENYLEPHRINE HCL (PRESSORS) 10 MG/ML IV SOLN
INTRAVENOUS | Status: DC | PRN
  Administered 2019-02-28: 120 ug via INTRAVENOUS
  Administered 2019-02-28: 80 ug via INTRAVENOUS
  Administered 2019-02-28: 120 ug via INTRAVENOUS

## 2019-02-28 MED ORDER — GLYCOPYRROLATE PF 0.2 MG/ML IJ SOSY
PREFILLED_SYRINGE | INTRAMUSCULAR | Status: DC | PRN
  Administered 2019-02-28: .1 mg via INTRAVENOUS

## 2019-02-28 MED ORDER — DEXAMETHASONE SODIUM PHOSPHATE 10 MG/ML IJ SOLN
INTRAMUSCULAR | Status: AC
  Filled 2019-02-28: qty 1

## 2019-02-28 MED ORDER — ALBUMIN HUMAN 5 % IV SOLN
INTRAVENOUS | Status: DC | PRN
  Administered 2019-02-28: 09:00:00 via INTRAVENOUS

## 2019-02-28 MED ORDER — FENTANYL CITRATE (PF) 100 MCG/2ML IJ SOLN: 25.0000 ug | INTRAMUSCULAR | Status: DC | PRN

## 2019-02-28 MED ORDER — POTASSIUM CHLORIDE 10 MEQ/100ML IV SOLN
10.0000 meq | INTRAVENOUS | Status: AC
  Administered 2019-02-28 (×4): 10 meq via INTRAVENOUS
  Filled 2019-02-28: qty 100

## 2019-02-28 MED ORDER — SUGAMMADEX SODIUM 200 MG/2ML IV SOLN
INTRAVENOUS | Status: DC | PRN
  Administered 2019-02-28: 200 mg via INTRAVENOUS
  Administered 2019-02-28: 50 mg via INTRAVENOUS

## 2019-02-28 MED ORDER — ONDANSETRON HCL 4 MG/2ML IJ SOLN
INTRAMUSCULAR | Status: AC
  Filled 2019-02-28: qty 2

## 2019-02-28 MED ORDER — 0.9 % SODIUM CHLORIDE (POUR BTL) OPTIME
TOPICAL | Status: DC | PRN
  Administered 2019-02-28: 1000 mL

## 2019-02-28 MED ORDER — FENTANYL CITRATE (PF) 100 MCG/2ML IJ SOLN
INTRAMUSCULAR | Status: DC | PRN
  Administered 2019-02-28 (×2): 50 ug via INTRAVENOUS

## 2019-02-28 MED ORDER — PROPOFOL 10 MG/ML IV BOLUS
INTRAVENOUS | Status: AC
  Filled 2019-02-28: qty 20

## 2019-02-28 MED ORDER — BUPIVACAINE HCL (PF) 0.25 % IJ SOLN
INTRAMUSCULAR | Status: AC
  Filled 2019-02-28: qty 30

## 2019-02-28 MED ORDER — PROPOFOL 10 MG/ML IV BOLUS
INTRAVENOUS | Status: DC | PRN
  Administered 2019-02-28: 50 mg via INTRAVENOUS
  Administered 2019-02-28: 30 mg via INTRAVENOUS

## 2019-02-28 MED ORDER — CEFAZOLIN SODIUM-DEXTROSE 2-4 GM/100ML-% IV SOLN
INTRAVENOUS | Status: AC
  Filled 2019-02-28: qty 100

## 2019-02-28 MED ORDER — MORPHINE SULFATE (PF) 2 MG/ML IV SOLN
1.0000 mg | INTRAVENOUS | Status: DC | PRN
  Administered 2019-03-03 (×2): 1 mg via INTRAVENOUS
  Filled 2019-02-28 (×2): qty 1

## 2019-02-28 MED ORDER — LACTATED RINGERS IV SOLN
INTRAVENOUS | Status: DC
  Administered 2019-02-28: 08:00:00 via INTRAVENOUS

## 2019-02-28 MED ORDER — SODIUM CHLORIDE 0.9 % IV SOLN
INTRAVENOUS | Status: DC | PRN
  Administered 2019-02-28: 25 ug/min via INTRAVENOUS

## 2019-02-28 MED ORDER — LIDOCAINE 2% (20 MG/ML) 5 ML SYRINGE
INTRAMUSCULAR | Status: DC | PRN
  Administered 2019-02-28: 60 mg via INTRAVENOUS

## 2019-02-28 MED ORDER — FENTANYL CITRATE (PF) 250 MCG/5ML IJ SOLN
INTRAMUSCULAR | Status: AC
  Filled 2019-02-28: qty 5

## 2019-02-28 MED ORDER — TRAVASOL 10 % IV SOLN
INTRAVENOUS | Status: AC
  Administered 2019-02-28: 18:00:00 via INTRAVENOUS
  Filled 2019-02-28: qty 636

## 2019-02-28 MED ORDER — BUPIVACAINE HCL 0.25 % IJ SOLN
INTRAMUSCULAR | Status: DC | PRN
  Administered 2019-02-28: 4 mL

## 2019-02-28 MED ORDER — LACTATED RINGERS IV SOLN
INTRAVENOUS | Status: DC | PRN
  Administered 2019-02-28: 08:00:00 via INTRAVENOUS

## 2019-02-28 MED ORDER — ROCURONIUM BROMIDE 50 MG/5ML IV SOSY
PREFILLED_SYRINGE | INTRAVENOUS | Status: DC | PRN
  Administered 2019-02-28: 20 mg via INTRAVENOUS

## 2019-02-28 MED ORDER — CEFAZOLIN SODIUM-DEXTROSE 2-3 GM-%(50ML) IV SOLR
INTRAVENOUS | Status: DC | PRN
  Administered 2019-02-28: 2 g via INTRAVENOUS

## 2019-02-28 SURGICAL SUPPLY — 36 items
ADH SKN CLS APL DERMABOND .7 (GAUZE/BANDAGES/DRESSINGS) ×1
APL PRP STRL LF DISP 70% ISPRP (MISCELLANEOUS) ×1
BLADE CLIPPER SURG (BLADE) IMPLANT
CANISTER SUCT 3000ML PPV (MISCELLANEOUS) IMPLANT
CHLORAPREP W/TINT 26 (MISCELLANEOUS) ×2 IMPLANT
COVER SURGICAL LIGHT HANDLE (MISCELLANEOUS) ×2 IMPLANT
COVER WAND RF STERILE (DRAPES) ×2 IMPLANT
DEFOGGER SCOPE WARMER CLEARIFY (MISCELLANEOUS) IMPLANT
DERMABOND ADVANCED (GAUZE/BANDAGES/DRESSINGS) ×1
DERMABOND ADVANCED .7 DNX12 (GAUZE/BANDAGES/DRESSINGS) ×1 IMPLANT
DRAPE WARM FLUID 44X44 (DRAPES) ×2 IMPLANT
ELECT REM PT RETURN 9FT ADLT (ELECTROSURGICAL) ×2
ELECTRODE REM PT RTRN 9FT ADLT (ELECTROSURGICAL) ×1 IMPLANT
GLOVE BIO SURGEON STRL SZ7.5 (GLOVE) ×2 IMPLANT
GOWN STRL REUS W/ TWL LRG LVL3 (GOWN DISPOSABLE) ×2 IMPLANT
GOWN STRL REUS W/ TWL XL LVL3 (GOWN DISPOSABLE) ×1 IMPLANT
GOWN STRL REUS W/TWL LRG LVL3 (GOWN DISPOSABLE) ×4
GOWN STRL REUS W/TWL XL LVL3 (GOWN DISPOSABLE) ×2
KIT BASIN OR (CUSTOM PROCEDURE TRAY) ×2 IMPLANT
KIT TURNOVER KIT B (KITS) ×2 IMPLANT
NDL INSUFFLATION 14GA 120MM (NEEDLE) ×1 IMPLANT
NEEDLE INSUFFLATION 14GA 120MM (NEEDLE) ×2 IMPLANT
NS IRRIG 1000ML POUR BTL (IV SOLUTION) ×2 IMPLANT
PAD ARMBOARD 7.5X6 YLW CONV (MISCELLANEOUS) ×4 IMPLANT
SCISSORS LAP 5X35 DISP (ENDOMECHANICALS) IMPLANT
SET IRRIG TUBING LAPAROSCOPIC (IRRIGATION / IRRIGATOR) IMPLANT
SET TUBE SMOKE EVAC HIGH FLOW (TUBING) ×2 IMPLANT
SLEEVE ENDOPATH XCEL 5M (ENDOMECHANICALS) ×2 IMPLANT
SUT MNCRL AB 4-0 PS2 18 (SUTURE) ×2 IMPLANT
TOWEL GREEN STERILE (TOWEL DISPOSABLE) ×2 IMPLANT
TOWEL GREEN STERILE FF (TOWEL DISPOSABLE) ×2 IMPLANT
TRAY LAPAROSCOPIC MC (CUSTOM PROCEDURE TRAY) ×2 IMPLANT
TROCAR XCEL 12X100 BLDLESS (ENDOMECHANICALS) IMPLANT
TROCAR XCEL BLUNT TIP 100MML (ENDOMECHANICALS) IMPLANT
TROCAR XCEL NON-BLD 11X100MML (ENDOMECHANICALS) IMPLANT
TROCAR XCEL NON-BLD 5MMX100MML (ENDOMECHANICALS) ×3 IMPLANT

## 2019-02-28 NOTE — Op Note (Signed)
02/28/2019  8:47 AM  PATIENT:  Tina Weaver  85 y.o. female  PRE-OPERATIVE DIAGNOSIS: Small bowel obstruction  POST-OPERATIVE DIAGNOSIS: Small bowel obstruction due to adhesion  PROCEDURE:  Procedure(s): LAPAROSCOPY DIAGNOSTIC (N/A) LYSIS OF ADHESION (N/A)  SURGEON:  Surgeon(s) and Role:    Ralene Ok, MD - Primary  ASSISTANTS: Algis Greenhouse, RNFA   ANESTHESIA:   local and general  EBL:  minimal   BLOOD ADMINISTERED:none  DRAINS: none   LOCAL MEDICATIONS USED:  BUPIVICAINE   SPECIMEN:  No Specimen  DISPOSITION OF SPECIMEN:  N/A  COUNTS:  YES  TOURNIQUET:  * No tourniquets in log *  DICTATION: .Dragon Dictation Indicates procedure: Patient is a 85 year old female with a small bowel obstruction.  This was treated nonoperatively for several days and failed to resolve.  Secondary to continued bowel obstruction patient was counseled and decided to proceed with diagnostic laparoscopy.  Findings: Patient had some omental adhesions that were adherent to the anterior abdominal wall.  Patient had dilated loops of proximal small bowel.  There was distal loops of decompressed small bowel.  This was traced to the area of obstruction.  There was 1 mesenteric adhesion to the loop of small bowel that was lysed.  This freed up the small bowel and the bowel obstruction.  There is no ischemia or hemorrhagic appearing small bowel.  Details of procedure: After the patient was consented she was taken back to the OR and placed in the supine position with bilateral SCDs in place.  Patient underwent general trach intubation.  Patient was then prepped and draped in standard fashion.  A timeout was called and all facts verified.  A Veress needle technique was used to insufflate the abdomen to 15 mmHg in the right subcostal margin.  Subsequent to this a 5 mm trocar and camera were then placed intra-abdominally.  There is no injury to any intra-abdominal organs.  A second 5 mm trochars placed  in the right lower quadrant in the epigastrium under direct visualization.  The patient was then positioned in Trendelenburg position.  It was evident there is large amount of distended small bowel.  There is an area to the right lower quadrant had collapsed small bowel.  This was traced proximally.  Area of concern was readily evident.  There appeared to be an area of mesenteric adhesion x1 that was overlying the bowel.  This was lysed sharply.  The small bowel was then freed.  This appeared to be the area of concern.  There was no concern for any ischemia to the bowel.  At this time the insufflation was evacuated.  Trochars were removed.  The skin was reapproximated using 4-0 Monocryl subcuticular fashion.  The skin was dressed with Dermabond.  The patient tired procedure well was taken to the recovery in stable condition.   PLAN OF CARE: Admit to inpatient   PATIENT DISPOSITION:  PACU - hemodynamically stable.   Delay start of Pharmacological VTE agent (>24hrs) due to surgical blood loss or risk of bleeding: no

## 2019-02-28 NOTE — Progress Notes (Signed)
PROGRESS NOTE  OPEL LEJEUNE CLE:[id number] DOB: 05/31/1919 DOA: 02/20/2019 PCP: Lajean Manes, MD   LOS: 7 days   Brief narrative: Tina Kates Bookeris a 86 y.o.femalewith medical history significant ofHTN, chronic diastolic CHF with hypertensive cardiomyopathy, AS. Patient presentedto the ED on 7/27with c/o abd pain for 3 days associated with nausea, generalized weakness.  CT of the abdomen and pelvis demonstrated a high grade SBO. Patient was admitted for small bowel obstruction. General surgery was consulted andpatient was started on conservative management with IV fluid, bowel rest, pain medicine and NG tube suction. Repeat abdominal x-ray today 7/30 slight interval increase in the size of the small bowel loops. Despite multiple days of conservative methods,patient did not clinically or radiographically improved from bowel obstruction. General surgery had multiple discussions with patient and her son/POA. Patient ultimately underwent diagnostic laparoscopy with lysis of adhesions on 8/3.  Subjective: Patient was seen and examined this afternoon post procedure.  Lying down in bed.  Sedated.  Tries to open eyes on verbal command.  Son at bedside. Has TPN going.  Assessment/Plan:  Principal Problem:   SBO (small bowel obstruction) (HCC) Active Problems:   HTN (hypertension)   CKD (chronic kidney disease), stage III (HCC)   Chronic diastolic heart failure (HCC)   Malnutrition of moderate degree  High-grade small bowel obstruction -CT scan finding as above. -Failed conservative management. -Underwent diagnostic laparoscopy with lysis of adhesions today on 8/3. -Currently has postoperative ileus. -Continue TPN.  Continue fluid management and electrolyte replacement.  Hypernatremia/hyperchloremia/hypokalemia -Electrolyte abnormalities secondary to continuous NG tube suction. -During the hospitalization, her sodium level increased, potassium and phosphorus level dropped.   Corrected with IV fluid choice and electrolyte replacement. -Normal sodium, potassium and phosphorus level this morning. -On TPN.  Continue to monitor electrolytes.  Cardiovascular issues:HTN,HLD, chronic diastolic CHF,CKD stage III -Prior to admission, patient was on Coreg 18.75 mg twice daily, Lasix 20 mg daily and Crestor 5 mg daily. -Continue Coreg, statin. Lasix on hold.Creatininebetter at 1 today.  Hypothyroidism -Continue Synthroid.  Mobility:Encourage ambulation.  Was able to ambulate prior to hospitalization. Diet:N.p.o, sips with meds DVT prophylaxis:Lovenox Code Status:Code Status: DNR Family Communication: Expected Discharge:Pending recovery from postop ileus  Consultants:  General surgery  Procedures:  None  Antimicrobials:  Anti-infectives (From admission, onward)   Start     Dose/Rate Route Frequency Ordered Stop   02/28/19 0721  ceFAZolin (ANCEF) 2-4 GM/100ML-% IVPB    Note to Pharmacy: Laurita Quint   : cabinet override      02/28/19 0721 02/28/19 1929   02/27/19 1230  ceFAZolin (ANCEF) IVPB 2g/100 mL premix     2 g 200 mL/hr over 30 Minutes Intravenous  Once 02/27/19 1158 02/27/19 1456      Infusions:   sodium chloride 250 mL (02/23/19 0955)   ceFAZolin     TPN ADULT (ION) 50 mL/hr at 02/28/19 0600   TPN ADULT (ION)      Scheduled Meds:  enoxaparin (LOVENOX) injection  30 mg Subcutaneous Q24H   insulin aspart  0-9 Units Subcutaneous Q4H   levothyroxine  88 mcg Oral Daily   mouth rinse  15 mL Mouth Rinse BID   rosuvastatin  5 mg Oral q1800   sodium chloride flush  3 mL Intravenous Once    PRN meds: sodium chloride, acetaminophen **OR** acetaminophen, fentaNYL (SUBLIMAZE) injection, hydrALAZINE, ipratropium-albuterol, menthol-cetylpyridinium, metoprolol tartrate, morphine injection, ondansetron **OR** ondansetron (ZOFRAN) IV, phenol, sodium chloride flush   Objective: Vitals:   02/28/19 0940 02/28/19 1015  BP: (!) 124/55 (!) 148/52  Pulse: 77 70  Resp: 14 16  Temp:    SpO2: 93% 93%    Intake/Output Summary (Last 24 hours) at 02/28/2019 1620 Last data filed at 02/28/2019 1400 Gross per 24 hour  Intake 2456.56 ml  Output 1427 ml  Net 1029.56 ml   Filed Weights   02/21/19 2130 02/22/19 2106 02/25/19 0300  Weight: 50.5 kg 50.5 kg 45 kg   Weight change:  Body mass index is 19.38 kg/m.   Physical Exam: General exam: Remains sedated, residual effect of anesthesia Skin: No rashes, lesions or ulcers. HEENT: Atraumatic, normocephalic, supple neck, no obvious bleeding Lungs: Clear to auscultation bilaterally CVS: Regular rate and rhythm, no murmur GI/Abd soft, mild left upper tenderness, bowel sound not heard CNS: Sleepy, tries to open eyes on verbal command Psychiatry: Unable to assess because of sedation Extremities: No pedal edema, no calf tenderness  Data Review: I have personally reviewed the laboratory data and studies available.  Recent Labs  Lab 02/22/19 0722 02/25/19 0519 02/26/19 0345 02/27/19 0419 02/28/19 0502  WBC 9.8 10.2 9.3 9.9 11.2*  NEUTROABS 7.4 7.0 6.3 6.8 7.4  HGB 13.0 11.5* 12.6 12.7 12.8  HCT 39.5 35.7* 39.4 38.8 39.8  MCV 99.7 100.0 102.9* 104.6* 101.8*  PLT 211 180 181 147* 186   Recent Labs  Lab 02/23/19 0436  02/26/19 0345 02/26/19 2000 02/27/19 0419 02/27/19 0825 02/28/19 0502  NA 149*   < > 143 140 138 142 143  K 3.4*   < > 3.2* 3.0* 5.8* 3.5 3.6  CL 112*   < > 104 96* 97* 101 103  CO2 22   < > 30 31 32 33* 31  GLUCOSE 114*   < > 146* 126* 488* 138* 91  BUN 34*   < > 26* 23 24* 24* 28*  CREATININE 1.26*   < > 1.03* 0.88 1.01* 0.93 1.00  CALCIUM 8.4*   < > 8.3* 8.2* 8.4* 8.3* 8.4*  MG 2.3  --  1.9  --   --   --  1.8  PHOS  --   --  2.4* 3.2 4.3 3.2 3.0   < > = values in this interval not displayed.    Terrilee Croak, MD  [facility] 02/28/2019

## 2019-02-28 NOTE — Progress Notes (Signed)
Patient with 575 ml of urine via bladder scan. I & O cath x 1 yielded  575 ml of output. Patient tolerated well. Dorthey Sawyer, RN

## 2019-02-28 NOTE — Progress Notes (Signed)
PT Cancellation Note  Patient Details Name: Tina Weaver MRN: [id number] DOB: April 02, 1919   Cancelled Treatment:    Reason Eval/Treat Not Completed: Patient at procedure or test/unavailable. Will check back as schedule allows to continue with PT POC.    Thelma Comp 02/28/2019, 7:37 AM   Rolinda Roan, PT, DPT Acute Rehabilitation Services Pager: [phone] Office: [phone]

## 2019-02-28 NOTE — Progress Notes (Signed)
Day of Surgery   Subjective/Chief Complaint: Pt with no changes overnight   Objective: Vital signs in last 24 hours: Temp:  [97.8 F (36.6 C)-98.4 F (36.9 C)] 98 F (36.7 C) (08/03 0629) Pulse Rate:  [65-71] 70 (08/03 0629) Resp:  [18-22] 22 (08/03 0629) BP: (152-191)/(50-66) 172/66 (08/03 0629) SpO2:  [96 %-98 %] 98 % (08/03 0629) Last BM Date: 02/23/19  Intake/Output from previous day: 08/02 0701 - 08/03 0700 In: 1231.6 [I.V.:694.2; IV Piggyback:537.4] Out: 1725 [Urine:675; Emesis/NG output:1050] Intake/Output this shift: No intake/output data recorded.  Constitutional: No acute distress, conversant, appears states age. Eyes: Anicteric sclerae, moist conjunctiva, no lid lag Lungs: Clear to auscultation bilaterally, normal respiratory effort CV: regular rate and rhythm, no murmurs, no peripheral edema, pedal pulses 2+ GI: Soft, distended, no masses or hepatosplenomegaly, non-tender to palpation Skin: No rashes, palpation reveals normal turgor Psychiatric: appropriate judgment and insight, oriented to person, place, and time   Lab Results:  Recent Labs    02/27/19 0419 02/28/19 0502  WBC 9.9 11.2*  HGB 12.7 12.8  HCT 38.8 39.8  PLT 147* 186   BMET Recent Labs    02/27/19 0825 02/28/19 0502  NA 142 143  K 3.5 3.6  CL 101 103  CO2 33* 31  GLUCOSE 138* 91  BUN 24* 28*  CREATININE 0.93 1.00  CALCIUM 8.3* 8.4*   Anti-infectives: Anti-infectives (From admission, onward)   Start     Dose/Rate Route Frequency Ordered Stop   02/28/19 0721  ceFAZolin (ANCEF) 2-4 GM/100ML-% IVPB    Note to Pharmacy: Laurita Quint   : cabinet override      02/28/19 0721 02/28/19 1929   02/27/19 1230  ceFAZolin (ANCEF) IVPB 2g/100 mL premix     2 g 200 mL/hr over 30 Minutes Intravenous  Once 02/27/19 1158 02/27/19 1456      Assessment/Plan: HTN CHF  SBO - Hx appendectomy and abdominal hysterctomy  - Mobilize as able for bowel function.PTworking with patient  FEN  -TPN. VTE -SCD, Lovenox ID -None POC- Zariah Cavendish (Son) [phone].Leoni Goodness [facility]) [phone], [phone]  I discussed with pt's son Dorothyann Peng about surgery and our plan.  All questions were answered. I d/w the pt the plan in surgery and plan for dx lap and possible ex lap and she was in favor of surgery.  I d/w her the possible complications to include but not limited to: infection, bleeding, damage to surrounding structures, possible ileus, and possible need for further surgery.   LOS: 7 days    Ralene Ok 02/28/2019

## 2019-02-28 NOTE — Anesthesia Procedure Notes (Signed)
Procedure Name: Intubation Date/Time: 02/28/2019 8:15 AM Performed by: Kaylyn Layer, RN Pre-anesthesia Checklist: Patient identified, Emergency Drugs available, Suction available, Patient being monitored and Timeout performed Patient Re-evaluated:Patient Re-evaluated prior to induction Oxygen Delivery Method: Circle system utilized Preoxygenation: Pre-oxygenation with 100% oxygen Induction Type: IV induction, Rapid sequence and Cricoid Pressure applied Laryngoscope Size: Mac and 3 Grade View: Grade II Tube type: Oral Tube size: 7.0 mm Number of attempts: 1 Airway Equipment and Method: Stylet Secured at: 21 cm Tube secured with: Tape Dental Injury: Teeth and Oropharynx as per pre-operative assessment

## 2019-02-28 NOTE — Transfer of Care (Signed)
Immediate Anesthesia Transfer of Care Note  Patient: Tina Weaver  Procedure(s) Performed: LAPAROSCOPY DIAGNOSTIC (N/A Abdomen) LYSIS OF ADHESION (N/A Abdomen)  Patient Location: PACU  Anesthesia Type:General  Level of Consciousness: awake, sedated and patient cooperative  Airway & Oxygen Therapy: Patient Spontanous Breathing and Patient connected to face mask oxygen  Post-op Assessment: Report given to RN and Post -op Vital signs reviewed and stable  Post vital signs: Reviewed and stable  Last Vitals:  Vitals Value Taken Time  BP 151/53 02/28/19 0910  Temp    Pulse 79 02/28/19 0913  Resp 12 02/28/19 0913  SpO2 100 % 02/28/19 0913  Vitals shown include unvalidated device data.  Last Pain:  Vitals:   02/28/19 0629  TempSrc: Oral  PainSc:       Patients Stated Pain Goal: 0 (41/71/27 8718)  Complications: No apparent anesthesia complications

## 2019-02-28 NOTE — Anesthesia Procedure Notes (Signed)
Arterial Line Insertion Start/End8/09/2018 7:50 AM, 02/28/2019 8:00 AM  Patient location: Pre-op. Preanesthetic checklist: patient identified, IV checked, site marked, risks and benefits discussed, surgical consent, monitors and equipment checked, pre-op evaluation and timeout performed Lidocaine 1% used for infiltration Right, radial was placed Catheter size: 20 G Hand hygiene performed  and maximum sterile barriers used  Allen's test indicative of satisfactory collateral circulation Attempts: 1 Procedure performed without using ultrasound guided technique. Ultrasound Notes:anatomy identified Following insertion, Biopatch and dressing applied. Post procedure assessment: normal  Patient tolerated the procedure well with no immediate complications.

## 2019-02-28 NOTE — Anesthesia Postprocedure Evaluation (Signed)
Anesthesia Post Note  Patient: Tina Weaver  Procedure(s) Performed: LAPAROSCOPY DIAGNOSTIC (N/A Abdomen) LYSIS OF ADHESION (N/A Abdomen)     Patient location during evaluation: PACU Anesthesia Type: General Level of consciousness: awake and alert and oriented Pain management: pain level controlled Vital Signs Assessment: post-procedure vital signs reviewed and stable Respiratory status: spontaneous breathing, nonlabored ventilation and respiratory function stable Cardiovascular status: blood pressure returned to baseline Postop Assessment: no apparent nausea or vomiting Anesthetic complications: no    Last Vitals:  Vitals:   02/28/19 0925 02/28/19 0940  BP: (!) 151/57 (!) 124/55  Pulse: 79 77  Resp: 11 14  Temp:    SpO2: 100% 93%    Last Pain:  Vitals:   02/28/19 0629  TempSrc: Oral  PainSc:                  Brennan Bailey

## 2019-02-28 NOTE — Progress Notes (Signed)
PHARMACY - ADULT TOTAL PARENTERAL NUTRITION CONSULT NOTE   Pharmacy Consult for TPN Indication: bowel obstruction  Patient Measurements: Height: 5' (152.4 cm) Weight: 99 lb 3.3 oz (45 kg) IBW/kg (Calculated) : 45.5 TPN AdjBW (KG): 50.8 Body mass index is 19.38 kg/m.  Assessment:  85 year old female admitted 7/26 with increased abdominal pain and nausea for 3 days and found to have high-grade SBO with NG tube placed. Prior to admission, patient lived alone and completed all activities of daily living independently. NGT output increased on 7/30. Patient has been NPO for 6 days here and reports poor PO intake for 3 days prior to admission due to symptoms. Pharmacy consulted for TPN.    *Initial lab work today was contaminated with TPN. Using repeat labs.   GI: High grade SBO- plan to proceed with surgery. NGT output up at 1050 mL. LBM 7/29. Pre-albumin 11.3>>10.4.  Endo: No history of DM. BG 105-143.  Insulin requirements in the past 24 hours: 4 units Lytes: K 3.6 (goal >4) - will replace and increase in TPN. Phos 3 -stabilized. Mg 1.8. CoCa 9.2. CO2 up 31 s/p Lasix doses.  Renal: SCr 1, BUN 26>>24>>28.  Pulm: RA  Cards: BP elevated on Coreg. HR 60s.  Hepatobil: AST 52>>31, others wnl. Tbili wnl. TG 75.  Neuro: Pain controlled.  ID: Afebrile, WBC 11.2. Peri-op antibiotics.   TPN Access: PICC placement 7/31 TPN start date: 7/31 Nutritional Goals (per RD recommendation on 7/31): KCal: 1250-1450 Protein: 60-75 Fluid: >/= 1.3L  Goal TPN rate is ~50 ml/hr (provides 70g protein, 185g dextrose, and 39g of lipids and 1332 kcals meeting 100% of patient's needs)   Current Nutrition:  Sips with medications, ice chips.  Plan:  Continue TPN at goal 50 mL/hr. This TPN provides 70 g of protein, 185 g of dextrose, and 39 g of lipids which provides 1332 kCals per day, meeting 100% of patient needs Electrolytes in TPN: increase K, others standard. Cl:Ace 2:1 Add MVI, trace elements  M,W,F Continue sensitive SSI q4hr and adjust as needed Monitor BMP -watch lytes.   KCl 10 mEq IV x4 today.    Alanda Slim, PharmD, [facility] Clinical Pharmacist Please see AMION for all Pharmacists' Contact Phone Numbers 02/28/2019, 7:33 AM

## 2019-03-01 ENCOUNTER — Telehealth: Payer: Self-pay | Admitting: Cardiology

## 2019-03-01 ENCOUNTER — Encounter (HOSPITAL_COMMUNITY): Payer: Self-pay | Admitting: General Surgery

## 2019-03-01 DIAGNOSIS — I350 Nonrheumatic aortic (valve) stenosis: Secondary | ICD-10-CM

## 2019-03-01 DIAGNOSIS — I421 Obstructive hypertrophic cardiomyopathy: Secondary | ICD-10-CM

## 2019-03-01 LAB — BASIC METABOLIC PANEL
Anion gap: 9 (ref 5–15)
BUN: 35 mg/dL — ABNORMAL HIGH (ref 8–23)
CO2: 25 mmol/L (ref 22–32)
Calcium: 8.4 mg/dL — ABNORMAL LOW (ref 8.9–10.3)
Chloride: 107 mmol/L (ref 98–111)
Creatinine, Ser: 0.95 mg/dL (ref 0.44–1.00)
GFR calc Af Amer: 58 mL/min — ABNORMAL LOW (ref >60)
GFR calc non Af Amer: 50 mL/min — ABNORMAL LOW (ref >60)
Glucose, Bld: 133 mg/dL — ABNORMAL HIGH (ref 70–99)
Potassium: 4.8 mmol/L (ref 3.5–5.1)
Sodium: 141 mmol/L (ref 135–145)

## 2019-03-01 LAB — GLUCOSE, CAPILLARY
Glucose-Capillary: 115 mg/dL — ABNORMAL HIGH (ref 70–99)
Glucose-Capillary: 119 mg/dL — ABNORMAL HIGH (ref 70–99)
Glucose-Capillary: 120 mg/dL — ABNORMAL HIGH (ref 70–99)
Glucose-Capillary: 127 mg/dL — ABNORMAL HIGH (ref 70–99)
Glucose-Capillary: 133 mg/dL — ABNORMAL HIGH (ref 70–99)
Glucose-Capillary: 154 mg/dL — ABNORMAL HIGH (ref 70–99)

## 2019-03-01 MED ORDER — TRAVASOL 10 % IV SOLN
INTRAVENOUS | Status: AC
  Administered 2019-03-01: 18:00:00 via INTRAVENOUS
  Filled 2019-03-01: qty 636

## 2019-03-01 NOTE — Progress Notes (Signed)
Physical Therapy Treatment Patient Details Name: Tina Weaver MRN: [id number] DOB: 1918-10-19 Today's Date: 03/01/2019    History of Present Illness Pt is a 85 y/o female admitted secondary to increased abdominal pain. Found to have SBO and had NG tube placed; now s/p laparoscopy and lysis of adhesions on 8/3; . PMH includes HTN, cardiomyopathy, d HF, and aortic stenosis.     PT Comments    Continuing work on functional mobility and activity tolerance;  Encourgement to participate, but she did walk in the hallway, and seemed pleased at end of session;   I favor dc home, but would like to confirm that she has 24 hour reliable assist avaiable    Follow Up Recommendations  Home health PT;Supervision/Assistance - 24 hour(initially )     Equipment Recommendations  Rolling walker with 5" wheels(youth-sized)    Recommendations for Other Services       Precautions / Restrictions Precautions Precautions: Fall;Other (comment) Precaution Comments: NG tube    Mobility  Bed Mobility Overal bed mobility: Needs Assistance Bed Mobility: Rolling;Sidelying to Sit Rolling: Min guard Sidelying to sit: Min assist       General bed mobility comments: Used log roll technique in hopes of havign less abdominal discomfort with getting up  Transfers Overall transfer level: Needs assistance Equipment used: Rolling walker (2 wheeled) Transfers: Sit to/from Stand Sit to Stand: Mod assist         General transfer comment: Light mod assist to power up  Ambulation/Gait Ambulation/Gait assistance: Min assist;+2 safety/equipment(son pushed chair behind) Gait Distance (Feet): 60 Feet Assistive device: Rolling walker (2 wheeled) Gait Pattern/deviations: Step-through pattern;Decreased stride length;Trunk flexed     General Gait Details: Slow, cautious gait. No LOB noted with use of RW.    Stairs             Wheelchair Mobility    Modified Rankin (Stroke Patients Only)        Balance     Sitting balance-Leahy Scale: Good       Standing balance-Leahy Scale: Poor Standing balance comment: Reliant on BUE support                             Cognition Arousal/Alertness: Awake/alert Behavior During Therapy: WFL for tasks assessed/performed Overall Cognitive Status: Within Functional Limits for tasks assessed                                        Exercises      General Comments        Pertinent Vitals/Pain Pain Assessment: Faces Faces Pain Scale: Hurts a little bit Pain Location: stomach Pain Descriptors / Indicators: Aching Pain Intervention(s): Monitored during session    Home Living                      Prior Function            PT Goals (current goals can now be found in the care plan section) Acute Rehab PT Goals Patient Stated Goal: to go home PT Goal Formulation: With patient Time For Goal Achievement: 03/09/19 Potential to Achieve Goals: Good Progress towards PT goals: Progressing toward goals    Frequency    Min 3X/week      PT Plan Current plan remains appropriate    Co-evaluation  AM-PAC PT "6 Clicks" Mobility   Outcome Measure  Help needed turning from your back to your side while in a flat bed without using bedrails?: A Little Help needed moving from lying on your back to sitting on the side of a flat bed without using bedrails?: A Little Help needed moving to and from a bed to a chair (including a wheelchair)?: A Little Help needed standing up from a chair using your arms (e.g., wheelchair or bedside chair)?: A Lot Help needed to walk in hospital room?: A Little Help needed climbing 3-5 steps with a railing? : A Lot 6 Click Score: 16    End of Session Equipment Utilized During Treatment: Gait belt(at axillae) Activity Tolerance: Patient tolerated treatment well Patient left: in chair;with call bell/phone within reach Nurse Communication: Mobility  status PT Visit Diagnosis: Other abnormalities of gait and mobility (R26.89);Muscle weakness (generalized) (M62.81)     Time: 5183-3582 PT Time Calculation (min) (ACUTE ONLY): 32 min  Charges:  $Gait Training: 23-37 mins                     Roney Marion, [place] Pager [phone] Office [place] 03/01/2019, 1:50 PM

## 2019-03-01 NOTE — Care Management Important Message (Signed)
Important Message  Patient Details  Name: Tina Weaver MRN: [id number] Date of Birth: [birth year]   Medicare Important Message Given:  Yes     Tina Weaver 03/01/2019, 12:41 PM

## 2019-03-01 NOTE — Care Management Important Message (Signed)
Important Message  Patient Details  Name: Tina Weaver MRN: [id number] Date of Birth: 1919-06-15   Medicare Important Message Given:  Yes     Meade Hogeland 03/01/2019, 12:42 PM

## 2019-03-01 NOTE — Telephone Encounter (Signed)
New Message     Pt has TOC appt with Lyda Jester 03/14/19 @ 9:15am

## 2019-03-01 NOTE — Progress Notes (Signed)
Patient with 436 ml of urine per bladder scan. I & O cath performed per Dr. Pietro Cassis. 425 ml output produced. Patient tolerated well. Dorthey Sawyer, RN

## 2019-03-01 NOTE — Progress Notes (Signed)
PHARMACY - ADULT TOTAL PARENTERAL NUTRITION CONSULT NOTE   Pharmacy Consult for TPN Indication: bowel obstruction  Patient Measurements: Height: 5' (152.4 cm) Weight: 99 lb 3.3 oz (45 kg) IBW/kg (Calculated) : 45.5 TPN AdjBW (KG): 50.8 Body mass index is 19.38 kg/m.  Assessment:  85 year old female admitted 7/26 with increased abdominal pain and nausea for 3 days and found to have high-grade SBO with NG tube placed. Prior to admission, patient lived alone and completed all activities of daily living independently. NGT output increased on 7/30. Patient has been NPO for 6 days here and reports poor PO intake for 3 days prior to admission due to symptoms. Pharmacy consulted for TPN.    *Initial lab work today was contaminated with TPN. Using repeat labs.   GI: High grade SBO- surgery 8/3 with lysis of adhesion. NGT output at 0 mL. LBM 7/29. Pre-albumin 11.3>>10.4.  Endo: No history of DM. BG 119-142.  Insulin requirements in the past 24 hours: 1 units Lytes: K 4.8 (goal >4) - decrease in TPN. Phos 3 -stabilized. Mg 1.8. CoCa 9.2. CO2 25.  Renal: SCr 1, BUN 26>>24>>28>>35. No documented UOP  Pulm: RA  Cards: BP elevated on Coreg. HR 60s.  Hepatobil: AST 52>>31, others wnl. Tbili wnl. TG 75.  Neuro: Pain controlled.  ID: Afebrile, WBC 11.2. Peri-op antibiotics.   TPN Access: PICC placement 7/31 TPN start date: 7/31 Nutritional Goals (per RD recommendation on 7/31): KCal: 1250-1450 Protein: 60-75 Fluid: >/= 1.3L  Goal TPN rate is ~50 ml/hr (provides 70g protein, 185g dextrose, and 39g of lipids and 1332 kcals meeting 100% of patient's needs)   Current Nutrition:  Sips with medications, ice chips.  Plan:  Continue TPN at goal 50 mL/hr. This TPN provides 70 g of protein, 185 g of dextrose, and 39 g of lipids which provides 1332 kCals per day, meeting 100% of patient needs Electrolytes in TPN: decrease K, others standard. Cl:Ace 2:1 Add MVI, trace elements M,W,F Continue  sensitive SSI q4hr and adjust as needed Monitor TPN labs.   Alanda Slim, PharmD, [facility] Clinical Pharmacist Please see AMION for all Pharmacists' Contact Phone Numbers 03/01/2019, 9:30 AM

## 2019-03-01 NOTE — Telephone Encounter (Signed)
**Note De-Identified Tina Weaver Obfuscation** The pt is currently in the hospital. We will monitor her chart and call once discharged.

## 2019-03-01 NOTE — Progress Notes (Signed)
1 Day Post-Op    CC: Abdominal pain  Subjective: Patient is having issues with urinary retention to get I/O cath currently. She is fairly uncomfortable from the procedure.  Her port sites all look good she still distended no bowel sounds.  I irrigated and changed today NG sump port.  NG is working but very little out.   Objective: Vital signs in last 24 hours: Temp:  [97.7 F (36.5 C)-97.8 F (36.6 C)] 97.8 F (36.6 C) (08/04 0442) Pulse Rate:  [70-86] 84 (08/04 0442) Resp:  [11-18] 18 (08/04 0442) BP: (124-164)/(52-62) 137/61 (08/04 0442) SpO2:  [93 %-100 %] 97 % (08/04 0442) Arterial Line BP: (135-140)/(55-57) 135/57 (08/03 0940) Last BM Date: 02/23/19 N.p.o. 1990 TPN No urine recorded Stool recorded Afebrile vital signs are stable. BMP is stable.   Intake/Output from previous day: 08/03 0701 - 08/04 0700 In: 1990.6 [I.V.:1339; IV Piggyback:651.5] Out: 2 [Blood:2] Intake/Output this shift: No intake/output data recorded.  General appearance: alert, mild distress and She is very uncomfortable as a try and place a Foley. GI: Still distended, port sites look good, no bowel sounds.  Minimal drainage from the NG.  Lab Results:  Recent Labs    02/27/19 0419 02/28/19 0502  WBC 9.9 11.2*  HGB 12.7 12.8  HCT 38.8 39.8  PLT 147* 186    BMET Recent Labs    02/28/19 0502 03/01/19 0337  NA 143 141  K 3.6 4.8  CL 103 107  CO2 31 25  GLUCOSE 91 133*  BUN 28* 35*  CREATININE 1.00 0.95  CALCIUM 8.4* 8.4*   PT/INR No results for input(s): LABPROT, INR in the last 72 hours.  Recent Labs  Lab 02/26/19 0345 02/28/19 0502  AST 52* 31  ALT 21 18  ALKPHOS 53 54  BILITOT 0.5 0.5  PROT 5.7* 5.6*  ALBUMIN 2.9* 2.5*     Lipase     Component Value Date/Time   LIPASE 34 02/20/2019 2253     Medications: . enoxaparin (LOVENOX) injection  30 mg Subcutaneous Q24H  . insulin aspart  0-9 Units Subcutaneous Q4H  . levothyroxine  88 mcg Oral Daily  . mouth rinse   15 mL Mouth Rinse BID  . rosuvastatin  5 mg Oral q1800  . sodium chloride flush  3 mL Intravenous Once    Assessment/Plan Hypernatremia/hyperchloremia/hypokalemia Hx hypertension Hx chronic diastolic congestive heart failure Hx stage III kidney disease Hypothyroid Urinary retention -I/O cath currently  SBO due to adhesions Diagnostic laparoscopy with lysis of adhesions 02/28/2019 Dr. Ralene Ok.  FEN: N.p.o./TPN ID: Ancef preop DVT: Lovenox Follow-up: Dr. Rosendo Gros POC: Sherylann, [place] Son [phone]    Rukiya, Hodg[place] [phone]  [phone]  Lakeyia, [place] Other   [phone]     Plan: Continue the NG today.  We will start to mobilize her some today.  Agree with ongoing TPN.  Await return of bowel function.    LOS: 8 days    Vashaun Osmon 03/01/2019 [phone]

## 2019-03-01 NOTE — Progress Notes (Signed)
PROGRESS NOTE  Tina Weaver OVZ:[id number] DOB: 1919/03/25 DOA: 02/20/2019 PCP: Tina Manes, MD   LOS: 8 days   Brief narrative: Tina Sykora Bookeris a 85 y.o.femalewith medical history significant ofHTN, chronic diastolic CHF with hypertensive cardiomyopathy, AS. Patient presentedto the ED on 7/27with c/o abd pain for 3 days associated with nausea, generalized weakness.  CT of the abdomen and pelvis demonstrated a high grade SBO. Patient was admitted for small bowel obstruction. General surgery was consulted andpatient was started on conservative management with IV fluid, bowel rest, pain medicine and NG tube suction. Repeat abdominal x-ray today 7/30 slight interval increase in the size of the small bowel loops. Despite multiple days of conservative methods,patient did not clinically or radiographically improved from bowel obstruction. General surgery had multiple discussions with patient and her son/POA. Patient ultimately underwent diagnostic laparoscopy with lysis of adhesions on 8/3.  Subjective: Patient was seen and examined this morning.  Pleasant elderly female.  Lying down in bed.  Not in distress.  Has not passed flatus yet.  NG tube attached to suction.  Assessment/Plan:  Principal Problem:   SBO (small bowel obstruction) (HCC) Active Problems:   HTN (hypertension)   CKD (chronic kidney disease), stage III (HCC)   Chronic diastolic heart failure (HCC)   Malnutrition of moderate degree  High-grade small bowel obstruction -CT scan finding as above. -Failed conservative management. -Underwent diagnostic laparoscopy with lysis of adhesions today on 8/3. -Currently has postoperative ileus.  Awaiting bowel function. -Continue TPN.  Continue fluid management and electrolyte replacement.  Hypernatremia/hyperchloremia/hypokalemia -Electrolyteabnormalities secondary to continuous NG tube suction. -During the hospitalization, her sodium level increased, potassium and  phosphorus level dropped. Corrected with IV fluid choice and electrolyte replacement. -Normal sodium,potassium level this morning.  Repeat blood work tomorrow. -On TPN.Continue to monitor electrolytes.  Cardiovascular issues:HTN,HLD, chronic diastolic CHF,moderate aortic stenosis, coronary artery disease, HOCM, CKD stage III -Prior to admission, patient was on Coreg 18.75 mg twice daily, Lasix 20 mg dailyandCrestor 5 mg daily. -Not symptomatic from heart failure, aortic stenosis or HOCM. -Cardiology consult appreciated. -Restart Coreg statin and aspirin once oral intake is insured.    Hypothyroidism -Resume Synthroid when able to take oral.  Mobility:Encourage ambulation.  Was able to ambulate prior to hospitalization. Diet:N.p.o, sips with meds DVT prophylaxis:Lovenox Code Status:Code Status: DNR Family Communication: Expected Discharge:Pending recovery from postop ileus  Consultants:  General surgery  Procedures:  None  Antimicrobials:  Anti-infectives (From admission, onward)   Start     Dose/Rate Route Frequency Ordered Stop   02/28/19 0721  ceFAZolin (ANCEF) 2-4 GM/100ML-% IVPB    Note to Pharmacy: Tina Weaver   : cabinet override      02/28/19 0721 02/28/19 1929   02/27/19 1230  ceFAZolin (ANCEF) IVPB 2g/100 mL premix     2 g 200 mL/hr over 30 Minutes Intravenous  Once 02/27/19 1158 02/27/19 1456      Infusions:  . sodium chloride 250 mL (02/23/19 0955)  . TPN ADULT (ION) 50 mL/hr at 02/28/19 1758  . TPN ADULT (ION)      Scheduled Meds: . enoxaparin (LOVENOX) injection  30 mg Subcutaneous Q24H  . insulin aspart  0-9 Units Subcutaneous Q4H  . levothyroxine  88 mcg Oral Daily  . mouth rinse  15 mL Mouth Rinse BID  . rosuvastatin  5 mg Oral q1800  . sodium chloride flush  3 mL Intravenous Once    PRN meds: sodium chloride, acetaminophen **OR** acetaminophen, fentaNYL (SUBLIMAZE) injection, hydrALAZINE, ipratropium-albuterol,  menthol-cetylpyridinium,  metoprolol tartrate, morphine injection, ondansetron **OR** ondansetron (ZOFRAN) IV, phenol, sodium chloride flush   Objective: Vitals:   03/01/19 0442 03/01/19 0940  BP: 137/61 (!) 137/45  Pulse: 84 78  Resp: 18 18  Temp: 97.8 F (36.6 C) 98.3 F (36.8 C)  SpO2: 97% 98%    Intake/Output Summary (Last 24 hours) at 03/01/2019 1505 Last data filed at 03/01/2019 1400 Gross per 24 hour  Intake 1115.56 ml  Output 425 ml  Net 690.56 ml   Filed Weights   02/21/19 2130 02/22/19 2106 02/25/19 0300  Weight: 50.5 kg 50.5 kg 45 kg   Weight change:  Body mass index is 19.38 kg/m.   Physical Exam: General exam: Opens eyes on verbal command, postoperative status.  NG tube to suction Skin: No rashes, lesions or ulcers. HEENT: Atraumatic, normocephalic, supple neck, no obvious bleeding Lungs: Clear to auscultate bilaterally CVS: Regular rate and rhythm, no murmur GI/Abd soft, mild appropriate tenderness, nondistended, bowel sounds sluggish CNS: Alert, awake, slow to respond Psychiatry: Mood appropriate Extremities: No pedal edema, no calf tenderness  Data Review: I have personally reviewed the laboratory data and studies available.  Recent Labs  Lab 02/25/19 0519 02/26/19 0345 02/27/19 0419 02/28/19 0502  WBC 10.2 9.3 9.9 11.2*  NEUTROABS 7.0 6.3 6.8 7.4  HGB 11.5* 12.6 12.7 12.8  HCT 35.7* 39.4 38.8 39.8  MCV 100.0 102.9* 104.6* 101.8*  PLT 180 181 147* 186   Recent Labs  Lab 02/23/19 0436  02/26/19 0345 02/26/19 2000 02/27/19 0419 02/27/19 0825 02/28/19 0502 03/01/19 0337  NA 149*   < > 143 140 138 142 143 141  K 3.4*   < > 3.2* 3.0* 5.8* 3.5 3.6 4.8  CL 112*   < > 104 96* 97* 101 103 107  CO2 22   < > 30 31 32 33* 31 25  GLUCOSE 114*   < > 146* 126* 488* 138* 91 133*  BUN 34*   < > 26* 23 24* 24* 28* 35*  CREATININE 1.26*   < > 1.03* 0.88 1.01* 0.93 1.00 0.95  CALCIUM 8.4*   < > 8.3* 8.2* 8.4* 8.3* 8.4* 8.4*  MG 2.3  --  1.9  --   --   --   1.8  --   PHOS  --   --  2.4* 3.2 4.3 3.2 3.0  --    < > = values in this interval not displayed.    Terrilee Croak, MD  [facility] 03/01/2019

## 2019-03-01 NOTE — Progress Notes (Addendum)
Progress Note  Patient Name: Tina Weaver Date of Encounter: 03/01/2019  Primary Cardiologist: Larae Grooms, MD   Subjective   Doing well POD #1 from diagnostic laparoscopy for SBO with lysis of adhesions.  Denies any chest pain or SOB.    Inpatient Medications    Scheduled Meds: . enoxaparin (LOVENOX) injection  30 mg Subcutaneous Q24H  . insulin aspart  0-9 Units Subcutaneous Q4H  . levothyroxine  88 mcg Oral Daily  . mouth rinse  15 mL Mouth Rinse BID  . rosuvastatin  5 mg Oral q1800  . sodium chloride flush  3 mL Intravenous Once   Continuous Infusions: . sodium chloride 250 mL (02/23/19 0955)  . TPN ADULT (ION) 50 mL/hr at 02/28/19 1758  . TPN ADULT (ION)     PRN Meds: sodium chloride, acetaminophen **OR** acetaminophen, fentaNYL (SUBLIMAZE) injection, hydrALAZINE, ipratropium-albuterol, menthol-cetylpyridinium, metoprolol tartrate, morphine injection, ondansetron **OR** ondansetron (ZOFRAN) IV, phenol, sodium chloride flush   Vital Signs    Vitals:   02/28/19 1722 02/28/19 2009 03/01/19 0442 03/01/19 0940  BP: (!) 164/62 (!) 126/58 137/61 (!) 137/45  Pulse: 86 81 84 78  Resp: 18 18 18 18   Temp: 97.7 F (36.5 C) 97.7 F (36.5 C) 97.8 F (36.6 C) 98.3 F (36.8 C)  TempSrc: Oral   Oral  SpO2: 96% 97% 97% 98%  Weight:      Height:        Intake/Output Summary (Last 24 hours) at 03/01/2019 1049 Last data filed at 03/01/2019 0942 Gross per 24 hour  Intake 1267.13 ml  Output 425 ml  Net 842.13 ml   Filed Weights   02/21/19 2130 02/22/19 2106 02/25/19 0300  Weight: 50.5 kg 50.5 kg 45 kg    Telemetry    NSR - Personally Reviewed  ECG    Lateral ST/T wave abnormality more pronounced in I and aVL and new in V5 and V6 - Personally Reviewed  Physical Exam   GEN: No acute distress.   Neck: No JVD Cardiac: RRR, no murmurs, rubs, or gallops.  Respiratory: Clear to auscultation bilaterally. GI: Soft, nontender, non-distended  MS: No edema; No  deformity. Neuro:  Nonfocal  Psych: Normal affect   Labs    Chemistry Recent Labs  Lab 02/26/19 0345  02/27/19 0825 02/28/19 0502 03/01/19 0337  NA 143   < > 142 143 141  K 3.2*   < > 3.5 3.6 4.8  CL 104   < > 101 103 107  CO2 30   < > 33* 31 25  GLUCOSE 146*   < > 138* 91 133*  BUN 26*   < > 24* 28* 35*  CREATININE 1.03*   < > 0.93 1.00 0.95  CALCIUM 8.3*   < > 8.3* 8.4* 8.4*  PROT 5.7*  --   --  5.6*  --   ALBUMIN 2.9*  --   --  2.5*  --   AST 52*  --   --  31  --   ALT 21  --   --  18  --   ALKPHOS 53  --   --  54  --   BILITOT 0.5  --   --  0.5  --   GFRNONAA 45*   < > 51* 47* 50*  GFRAA 52*   < > 59* 54* 58*  ANIONGAP 9   < > 8 9 9    < > = values in this interval not displayed.  Hematology Recent Labs  Lab 02/26/19 0345 02/27/19 0419 02/28/19 0502  WBC 9.3 9.9 11.2*  RBC 3.83* 3.71* 3.91  HGB 12.6 12.7 12.8  HCT 39.4 38.8 39.8  MCV 102.9* 104.6* 101.8*  MCH 32.9 34.2* 32.7  MCHC 32.0 32.7 32.2  RDW 14.5 14.6 14.4  PLT 181 147* 186    Cardiac EnzymesNo results for input(s): TROPONINI in the last 168 hours. No results for input(s): TROPIPOC in the last 168 hours.   BNPNo results for input(s): BNP, PROBNP in the last 168 hours.   DDimer No results for input(s): DDIMER in the last 168 hours.   Radiology    No results found.  Cardiac Studies   none  Patient Profile     85 y.o. female with past medical history of hypertension, chronic diastolic heart failure, hypertensive cardiomyopathy, carotid artery disease and aortic stenosis.  She does not have any prior diagnosis of CAD.  Previous echocardiogram obtained on 03/08/2015 showed EF 65 to 70%, grade 1 DD, severe LVH, moderate aortic stenosis.  She has known prior history of occluded right ICA.  Previous carotid Doppler obtained in June 2017 showed left ICA 40 to 59% disease.  Unchanged when compared to the previous study in 2015.  Admitted with SBO.    Assessment & Plan    1.  HOCM -hx of LVH  on prior echo -she has been asymptomatic -she does have LVH on echo with repol changes in lateral leads more prominent in I and aVL than prior EKG and new in V5 and V6 - she is asymptomatic.  2.  Hypertension -BP controlled at 137/109mmHg this am -restart home meds including Carvedilol 18.75mg  BID once taking adequate PO  3.  Carotid artery disease -Known occluded right internal carotid artery, 40 to 59% disease on left on the previous carotid Doppler in 2017. -restart statin and ASA when taking PO and ok with surgery  4.  Moderate AS -Seen on previous echocardiogram in 2016.  -She does not have any symptoms consistent with critical aortic stenosis.  5.  Chronic diastolic CHF -she does not appear volume overloaded on exam -restart once taking PO Lasix 20mg  daily   [facility] will sign off.   Medication Recommendations:  Carvedilol 18.75mg  BID, ASA 81mg  daily, Lasix 20mg  daily and Crestor 5mg  daily. Other recommendations (labs, testing, etc):  none Follow up as an outpatient:  followup in our office in 3-4 weeks with extender  For questions or updates, please contact [facility] Please consult [email or web address] for contact info under Cardiology/STEMI.      Signed, Fransico Him, MD  03/01/2019, 10:49 AM

## 2019-03-02 LAB — GLUCOSE, CAPILLARY
Glucose-Capillary: 119 mg/dL — ABNORMAL HIGH (ref 70–99)
Glucose-Capillary: 156 mg/dL — ABNORMAL HIGH (ref 70–99)
Glucose-Capillary: 102 mg/dL — ABNORMAL HIGH (ref 70–99)
Glucose-Capillary: 117 mg/dL — ABNORMAL HIGH (ref 70–99)
Glucose-Capillary: 114 mg/dL — ABNORMAL HIGH (ref 70–99)
Glucose-Capillary: 134 mg/dL — ABNORMAL HIGH (ref 70–99)

## 2019-03-02 MED ORDER — TRAVASOL 10 % IV SOLN
INTRAVENOUS | Status: AC
  Administered 2019-03-02: 17:00:00 via INTRAVENOUS
  Filled 2019-03-02: qty 705.6

## 2019-03-02 NOTE — Progress Notes (Signed)
Nutrition Follow-up  DOCUMENTATION CODES:   Non-severe (moderate) malnutrition in context of chronic illness  INTERVENTION:   -Monitor for BM   Continue TPN to meet 100% of needs- management per pharmacy  Ensure Enlive po TID, each supplement provides 350 kcal and 20 grams of protein once diet advanced   NUTRITION DIAGNOSIS:   Moderate Malnutrition related to chronic illness(CHF) as evidenced by mild fat depletion, moderate fat depletion, mild muscle depletion, moderate muscle depletion.  Ongoing  GOAL:   Patient will meet greater than or equal to 90% of their needs  Met via TPN  MONITOR:   Labs, I & O's, Weight trends, Diet advancement, Other (Comment)(TPN)  REASON FOR ASSESSMENT:   Consult New TPN/TNA  ASSESSMENT:   85 year old female who presented to the ED on 7/26 with nausea, abdominal pain, and weakness. PMH of HTN with LVH, hypertrophic cardiomyopathy, CHF, and aortic stenosis. Pt was found to have a SBO and NG tube placed by fluoro.   7/31- TPN start 8/3- diagnositic lap, lysis of adhesions  Pt without bowel sounds and slight abdominal distention this am. NGT clampedl. Denies nausea. TPN increased to 60 ml/hr today- provides 71 g protein and 1396 kcal. Meets 100% of needs. RD to provide supplementation once diet is advanced.   Admission weight: 50.8 kg Current weight: 45 kg  I/O: -4,692 ml since admit UOP: 775 ml x 24 hrs  NGT: 850 ml x 24 hrs    Medications: SS novolog Labs: CBG 102-154   Diet Order:   Diet Order            Diet NPO time specified  Diet effective now              EDUCATION NEEDS:   Education needs have been addressed  Skin:  Skin Assessment: Reviewed RN Assessment  Last BM:  7/29  Height:   Ht Readings from Last 1 Encounters:  02/20/19 5' (1.524 m)    Weight:   Wt Readings from Last 1 Encounters:  02/25/19 45 kg    Ideal Body Weight:  45.5 kg  BMI:  Body mass index is 19.38 kg/m.  Estimated  Nutritional Needs:   Kcal:  1250-1450  Protein:  60-75 grams  Fluid:  >/= 1.3 L   Mariana Single RD, LDN Clinical Nutrition Pager # [phone]

## 2019-03-02 NOTE — Progress Notes (Signed)
Patient bladder scanned and 688cc shown. Had pt get OOB and sit on BSC. Pt was able to void 350cc dark concentrated urine. Placed pt back in bed and bladder scanned her again. This time, 344cc scanned.

## 2019-03-02 NOTE — Progress Notes (Signed)
2 Days Post-Op    CC: Abdominal pain   Subjective: OT is getting her up to walk now.  Her tube is clamped for this, she is not complaining of any nausea.  She is not really complaining of pain.  I do not see an incentive spirometer in the room.  Abdomen is normal soreness still a little bit distended, no bowel sounds.  Lungs are fairly clear.  Objective: Vital signs in last 24 hours: Temp:  [97.7 F (36.5 C)-98.8 F (37.1 C)] 97.8 F (36.6 C) (08/05 0800) Pulse Rate:  [81-88] 82 (08/05 0800) Resp:  [16-18] 17 (08/05 0800) BP: (125-140)/(60-69) 132/69 (08/05 0800) SpO2:  [96 %-99 %] 98 % (08/05 0800) Last BM Date: 02/23/19 30 p.o. 1150 IV 850 from the NG No BM Afebrile vital signs are stable No labs today. Intake/Output from previous day: 08/04 0701 - 08/05 0700 In: 1182 [P.O.:30; I.V.:1152] Out: 1625 [Urine:775; Emesis/NG output:850] Intake/Output this shift: No intake/output data recorded.  General appearance: alert, cooperative, no distress and Frail-appearing Resp: clear to auscultation bilaterally GI: Soft, sore, no bowel sounds or flatus so far.  Lab Results:  Recent Labs    02/28/19 0502  WBC 11.2*  HGB 12.8  HCT 39.8  PLT 186    BMET Recent Labs    02/28/19 0502 03/01/19 0337  NA 143 141  K 3.6 4.8  CL 103 107  CO2 31 25  GLUCOSE 91 133*  BUN 28* 35*  CREATININE 1.00 0.95  CALCIUM 8.4* 8.4*   PT/INR No results for input(s): LABPROT, INR in the last 72 hours.  Recent Labs  Lab 02/26/19 0345 02/28/19 0502  AST 52* 31  ALT 21 18  ALKPHOS 53 54  BILITOT 0.5 0.5  PROT 5.7* 5.6*  ALBUMIN 2.9* 2.5*     Lipase     Component Value Date/Time   LIPASE 34 02/20/2019 2253     Medications: . enoxaparin (LOVENOX) injection  30 mg Subcutaneous Q24H  . insulin aspart  0-9 Units Subcutaneous Q4H  . levothyroxine  88 mcg Oral Daily  . mouth rinse  15 mL Mouth Rinse BID  . rosuvastatin  5 mg Oral q1800  . sodium chloride flush  3 mL  Intravenous Once    Assessment/Plan Hypernatremia/hyperchloremia/hypokalemia Hx hypertension Hx chronic diastolic congestive heart failure Hx stage III kidney disease Hypothyroid Urinary retention -I/O cath currently  SBO due to adhesions Diagnostic laparoscopy with lysis of adhesions 02/28/2019 Dr. Ralene Ok.  FEN: N.p.o./TPN ID: Ancef preop DVT: Lovenox Follow-up: Dr. Rosendo Gros POC: Zyla, Dascenzo Son [phone]    Shaylyn, Bawa [phone]  [phone]  [place],Linda Other   [phone]   Plan: Clamping trials, continue TPN, OT and PT.       LOS: 9 days    Chelsie Burel 03/02/[id number]

## 2019-03-02 NOTE — Progress Notes (Signed)
At bedside to assess TPN tubing due to leaking . There was a small area where the filter was attached that was leaking. TPN was stopped and discontinued. New cap placed flushed with 10 ml NS the D10 hung at the rate of 29ml/hr until new bag of TPN goes up this evening.

## 2019-03-02 NOTE — Progress Notes (Signed)
PROGRESS NOTE  Tina Weaver:[id number] DOB: 1918/11/03 DOA: 02/20/2019 PCP: Lajean Manes, MD   LOS: 9 days   Brief narrative: Nikola Blackston Bookeris a 85 y.o.femalewith medical history significant ofHTN, chronic diastolic CHF with hypertensive cardiomyopathy, AS. Patient presentedto the ED on 7/27with c/o abd pain for 3 days associated with nausea, generalized weakness. CT of the abdomen and pelvis demonstrated a high grade SBO. Patient was admitted for small bowel obstruction. General surgery was consulted andpatient was started on conservative management with IV fluid, bowel rest, pain medicine and NG tube suction. Repeat abdominal x-ray 7/30 slight interval increase in the size of the small bowel loops. Despite multiple days of conservative methods,patient did not clinically or radiographically improve. General surgery had multiple discussions with patient and her son/POA. Patient ultimately underwent diagnostic laparoscopy with lysis of adhesions on 8/3.  Subjective: Patient seen and examined at bedside, noted to have abdominal tenderness on examination.  Denies any other complaints.  NG tube in place     Assessment/Plan:  Principal Problem:   SBO (small bowel obstruction) (HCC) Active Problems:   HTN (hypertension)   CKD (chronic kidney disease), stage III (HCC)   Chronic diastolic heart failure (HCC)   Malnutrition of moderate degree  High-grade small bowel obstruction status post laparoscopic adhesion lysis on 8/3 by general surgery CT scan finding as above. Failed conservative management. Currently has postoperative ileus.  Awaiting bowel function. Continue TPN, n.p.o. General surgery on board  Cardiovascular issues:HTN,HLD, chronic diastolic CHF,moderate aortic stenosis, coronary artery disease, HOCM, CKD stage III Prior to admission, patient was on Coreg 18.75 mg twice daily, Lasix 20 mg dailyandCrestor 5 mg daily. Not symptomatic from heart failure, aortic  stenosis or HOCM. Cardiology consult appreciated.  Hypothyroidism Continue Synthroid   Mobility:Encourage ambulation.  Was able to ambulate prior to hospitalization. Diet:N.p.o, sips with meds DVT prophylaxis:Lovenox Code Status:Code Status: DNR Family Communication:None at bedside Expected Discharge:To be determined, likely SNF  Consultants:  General surgery  Procedures:  Laparoscopic adhesional lysis on 8/3  Antimicrobials:  Anti-infectives (From admission, onward)   Start     Dose/Rate Route Frequency Ordered Stop   02/28/19 0721  ceFAZolin (ANCEF) 2-4 GM/100ML-% IVPB    Note to Pharmacy: Laurita Quint   : cabinet override      02/28/19 0721 02/28/19 1929   02/27/19 1230  ceFAZolin (ANCEF) IVPB 2g/100 mL premix     2 g 200 mL/hr over 30 Minutes Intravenous  Once 02/27/19 1158 02/27/19 1456      Infusions:  . sodium chloride 250 mL (02/23/19 0955)  . TPN ADULT (ION) 60 mL/hr at 03/02/19 1706    Scheduled Meds: . enoxaparin (LOVENOX) injection  30 mg Subcutaneous Q24H  . insulin aspart  0-9 Units Subcutaneous Q4H  . levothyroxine  88 mcg Oral Daily  . mouth rinse  15 mL Mouth Rinse BID  . rosuvastatin  5 mg Oral q1800  . sodium chloride flush  3 mL Intravenous Once    PRN meds: sodium chloride, acetaminophen **OR** acetaminophen, fentaNYL (SUBLIMAZE) injection, hydrALAZINE, ipratropium-albuterol, menthol-cetylpyridinium, metoprolol tartrate, morphine injection, ondansetron **OR** ondansetron (ZOFRAN) IV, phenol, sodium chloride flush   Objective: Vitals:   03/02/19 0800 03/02/19 1621  BP: 132/69 (!) 154/60  Pulse: 82 78  Resp: 17 18  Temp: 97.8 F (36.6 C) 97.8 F (36.6 C)  SpO2: 98% 98%    Intake/Output Summary (Last 24 hours) at 03/02/2019 1817 Last data filed at 03/02/2019 1441 Gross per 24 hour  Intake 762 ml  Output 1450 ml  Net -688 ml   Filed Weights   02/21/19 2130 02/22/19 2106 02/25/19 0300  Weight: 50.5 kg 50.5 kg 45 kg    Weight change:  Body mass index is 19.38 kg/m.   Physical Exam:  General: NAD, NG tube to suction  Cardiovascular: S1, S2 present  Respiratory: CTAB  Abdomen: Soft, mild generalized tenderness, nondistended, bowel sounds present  Musculoskeletal: No bilateral pedal edema noted  Skin: Normal  Psychiatry: Normal mood   Data Review: I have personally reviewed the laboratory data and studies available.  Recent Labs  Lab 02/25/19 0519 02/26/19 0345 02/27/19 0419 02/28/19 0502  WBC 10.2 9.3 9.9 11.2*  NEUTROABS 7.0 6.3 6.8 7.4  HGB 11.5* 12.6 12.7 12.8  HCT 35.7* 39.4 38.8 39.8  MCV 100.0 102.9* 104.6* 101.8*  PLT 180 181 147* 186   Recent Labs  Lab 02/26/19 0345 02/26/19 2000 02/27/19 0419 02/27/19 0825 02/28/19 0502 03/01/19 0337  NA 143 140 138 142 143 141  K 3.2* 3.0* 5.8* 3.5 3.6 4.8  CL 104 96* 97* 101 103 107  CO2 30 31 32 33* 31 25  GLUCOSE 146* 126* 488* 138* 91 133*  BUN 26* 23 24* 24* 28* 35*  CREATININE 1.03* 0.88 1.01* 0.93 1.00 0.95  CALCIUM 8.3* 8.2* 8.4* 8.3* 8.4* 8.4*  MG 1.9  --   --   --  1.8  --   PHOS 2.4* 3.2 4.3 3.2 3.0  --     Alma Friendly, MD  [facility] 03/02/2019

## 2019-03-02 NOTE — Progress Notes (Addendum)
PHARMACY - ADULT TOTAL PARENTERAL NUTRITION CONSULT NOTE   Pharmacy Consult for TPN Indication: bowel obstruction  Patient Measurements: Height: 5' (152.4 cm) Weight: 99 lb 3.3 oz (45 kg) IBW/kg (Calculated) : 45.5 TPN AdjBW (KG): 50.8 Body mass index is 19.38 kg/m.  Assessment:  85 year old female admitted 7/26 with increased abdominal pain and nausea for 3 days and found to have high-grade SBO with NG tube placed. Prior to admission, patient lived alone and completed all activities of daily living independently. NGT output increased on 7/30. Patient has been NPO for 6 days here and reports poor PO intake for 3 days prior to admission due to symptoms. Pharmacy consulted for TPN.    GI: High grade SBO- surgery 8/3 with lysis of adhesion. NGT output at 850 mL. LBM 7/29. Pre-albumin 11.3>>10.4.  Endo: No history of DM. BG 120 - 150s Insulin requirements in the past 24 hours: 4 units Lytes: No new labs today Renal: SCr 1 from 8/4. UOP 0.7 mL/kg/hr Pulm: RA  Cards: BP elevated on Coreg. HR 60s.  Hepatobil: AST 52>>31, others wnl. Tbili wnl. TG 75. (from 8/3) Neuro: Pain controlled.  ID: Afebrile, WBC 11.2. Peri-op antibiotics.   TPN Access: PICC placement 7/31 TPN start date: 7/31 Nutritional Goals (per RD recommendation on 7/31): KCal: 1250-1450 Protein: 60-75 Fluid: >/= 1.3L  Goal TPN rate is ~60 ml/hr (provides 70.5g protein, 190g dextrose, and 47g of lipids and 1396 kcals meeting 100% of patient's needs)   Current Nutrition:  Sips with medications, ice chips.  Plan:  Increase TPN to 60 mL/hr to meet fluid goal - macro components at goal  Electrolytes in TPN: continue decreased K, others standard. Cl:Ace 2:1 Continue MVI daily, Trace Elements MWF  Continue sensitive SSI q4hr and adjust as needed (could consider q6 if remain stable) Monitor TPN labs.   Levester Fresh, PharmD, BCPS, BCCCP Clinical Pharmacist [phone]  Please check AMION for all [place]  numbers  03/02/2019 7:27 AM  Addendum: TPN was leaking am 8/5, so D10 hung at 50 mL/hr from about 1045 until new bag of TPN hung

## 2019-03-02 NOTE — Progress Notes (Signed)
Occupational Therapy Treatment Patient Details Name: Tina Weaver MRN: [id number] DOB: 06/05/1919 Today's Date: 03/02/2019    History of present illness Pt is a 85 y/o female admitted secondary to increased abdominal pain. Found to have SBO and had NG tube placed; now s/p laparoscopy and lysis of adhesions on 8/3; . PMH includes HTN, cardiomyopathy, d HF, and aortic stenosis.    OT comments  Pt presents supine in bed willing to participate in therapy session; son present throughout session and providing encouragement/support throughout. Pt with gradual improvements in endurance and able to tolerate functional mobility into hallway using RW with overall light minA, son assisting with chair follow during mobility task. Pt requires cues to initiate seated rest breaks with activity today, DOE noted 2/4 post activity with cues provided for deep breathing techniques. Pt completing simple grooming ADL with setup assist while seated. Feel pt could progress to point of returning home with [facility] services, however recommend pt have 24hr supervision/assist if going home. If pt unable to have 24hr assist continue to recommend SNF at time of discharge. Will follow while acutely admitted.   Follow Up Recommendations  SNF;Supervision/Assistance - 24 hour;Home health OT(SNF vs Home with 24/hr and HHOT)    Equipment Recommendations  3 in 1 bedside commode;Other (comment)(RW)          Precautions / Restrictions Precautions Precautions: Fall;Other (comment) Precaution Comments: NG tube Restrictions Weight Bearing Restrictions: No       Mobility Bed Mobility Overal bed mobility: Needs Assistance Bed Mobility: Rolling;Sidelying to Sit Rolling: Min guard Sidelying to sit: Min guard       General bed mobility comments: modified log roll used for pain management; minguard for safety  Transfers Overall transfer level: Needs assistance Equipment used: Rolling walker (2 wheeled) Transfers: Sit to/from  Stand Sit to Stand: Min assist;Min guard         General transfer comment: pt stood x2 from EOB, initially with minguard assist and second time with light boosting/steadying assist at [employer] Overall balance assessment: Needs assistance   Sitting balance-Leahy Scale: Good       Standing balance-Leahy Scale: Poor Standing balance comment: Reliant on BUE support                            ADL either performed or assessed with clinical judgement   ADL Overall ADL's : Needs assistance/impaired Eating/Feeding: NPO   Grooming: Set up;Min guard;Sitting Grooming Details (indicate cue type and reason): minguard due to NG tube         Upper Body Dressing : Set up;Sitting Upper Body Dressing Details (indicate cue type and reason): donning second gown                 Functional mobility during ADLs: Minimal assistance;Min guard;Rolling walker General ADL Comments: pt and pt's son requesting to perform further mobility this session, so part of session included mobility into hallway for continued strengthening/endurance; pt requires cues to take rest break     Vision       Perception     Praxis      Cognition Arousal/Alertness: Awake/alert Behavior During Therapy: [facility] for tasks assessed/performed;Flat affect Overall Cognitive Status: Difficult to assess                                 General Comments: overall appears appropriate and following  commands, but pt not very talkative and soft spoken, so difficult to fully assess         Exercises     Shoulder Instructions       General Comments pt with DOE 2/4 post mobility in hallway, improves with seated rest and cues for slow, deep breathing techniques    Pertinent Vitals/ Pain       Pain Assessment: No/denies pain  Home Living                                          Prior Functioning/Environment              Frequency  Min 2X/week         Progress Toward Goals  OT Goals(current goals can now be found in the care plan section)  Progress towards OT goals: Progressing toward goals  Acute Rehab OT Goals Patient Stated Goal: to go home OT Goal Formulation: With patient Time For Goal Achievement: 03/10/19 Potential to Achieve Goals: Good  Plan Discharge plan remains appropriate    Co-evaluation                 AM-PAC OT "6 Clicks" Daily Activity     Outcome Measure   Help from another person eating meals?: Total(NPO) Help from another person taking care of personal grooming?: A Little Help from another person toileting, which includes using toliet, bedpan, or urinal?: A Little Help from another person bathing (including washing, rinsing, drying)?: A Little Help from another person to put on and taking off regular upper body clothing?: A Little Help from another person to put on and taking off regular lower body clothing?: A Little 6 Click Score: 16    End of Session Equipment Utilized During Treatment: Gait belt;Rolling walker  OT Visit Diagnosis: Muscle weakness (generalized) (M62.81);Other abnormalities of gait and mobility (R26.89)   Activity Tolerance Patient tolerated treatment well   Patient Left in chair;with call bell/phone within reach;with family/visitor present   Nurse Communication Mobility status(PA request IS, but therapist unable to locate one)        Time: 5597-4163 OT Time Calculation (min): 34 min  Charges: OT General Charges $OT Visit: 1 Visit OT Treatments $Self Care/Home Management : 23-37 mins  Lou Cal, OT Supplemental Rehabilitation Services Pager [phone] Office [phone]    Raymondo Band 03/02/2019, 3:19 PM

## 2019-03-02 NOTE — TOC Initial Note (Addendum)
Transition of Care [facility]) - Initial/Assessment Note    Patient Details  Name: Tina Weaver MRN: [id number] Date of Birth: 12/12/1918  Transition of Care [facility]) CM/SW Contact:    Bartholomew Crews, RN Phone Number: [phone] 03/02/2019, 12:24 PM  Clinical Narrative:                 Spoke with patient and son Glenda Spelman) at the bedside. Richardson Landry called his brother, Laveyah Oriol, who is local. 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 Discussed PT recommendations for [facility] PT and 24 hour supervision. Patient is making good progress with ambulating. Patient does have a walker at home, but could benefit from 3n1. Discussed [place] agency choice - referral placed to [facility] for RN, PT, Aide, and SW -  Accepted. Will need HH orders with Face to Face. Requested 3n1 from [facility]. Will need DME order. Provided resource for [employer] [facility] [place]. Advised to research [facility] agencies by going to medicare.gov and search home health compare. Family to arrange 24 hour supervision in the home. No medication needs - uses [facility] which delivers. PCP - Dr. Felipa Eth. CM to follow for transition of care needs.   Expected Discharge Plan: [place] Barriers to Discharge: Continued Medical Work up   Patient Goals and CMS Choice Patient states their goals for this hospitalization and ongoing recovery are:: patient CMS Medicare.gov Compare Post Acute Care list provided to:: Patient Represenative (must comment)(stan Drinkard) Choice offered to / list presented to : Patient, Adult Children  Expected Discharge Plan and Services Expected Discharge Plan: [place] In-house Referral: [facility] Discharge Planning Services: CM Consult Post Acute Care Choice: Durable Medical Equipment, Home Health Living arrangements for the past 2 months: Single Family Home                 DME Arranged: 3-N-1 DME Agency: [facility] Date DME Agency Contacted:  03/02/19 Time DME Agency Contacted: 1222 Representative spoke with at DME Agency: [facility] Arranged: RN, PT, Nurse's Aide [place] Agency: [place] ([place]) Date [place]: 03/02/19 Time [place]: 1223 Representative spoke with at [place]: [place] Arrangements/Services Living arrangements for the past 2 months: [place] with:: Self Patient language and need for interpreter reviewed:: Yes Do you feel safe going back to the place where you live?: Yes      Need for Family Participation in Patient Care: Yes (Comment) Care giver support system in place?: Yes (comment) Current home services: DME Criminal Activity/Legal Involvement Pertinent to Current Situation/Hospitalization: No - Comment as needed  Activities of Daily Living Home Assistive Devices/Equipment: None ADL Screening (condition at time of admission) Patient's cognitive ability adequate to safely complete daily activities?: Yes Is the patient deaf or have difficulty hearing?: No Does the patient have difficulty seeing, even when wearing glasses/contacts?: No Does the patient have difficulty concentrating, remembering, or making decisions?: No Patient able to express need for assistance with ADLs?: Yes Does the patient have difficulty dressing or bathing?: No Independently performs ADLs?: Yes (appropriate for developmental age) Does the patient have difficulty walking or climbing stairs?: No Weakness of Legs: Both Weakness of Arms/Hands: None  Permission Sought/Granted Permission sought to share information with : Family Supports Permission granted to share information with : Yes, Verbal Permission Granted  Share Information with NAME: Aaniyah Strohm     Permission granted to share info w Relationship:  son  Permission granted to share info w Contact Information: [phone]  Emotional Assessment Appearance:: Appears stated age Attitude/Demeanor/Rapport:  Engaged Affect (typically observed): Accepting Orientation: : Oriented to Situation, Oriented to  Time, Oriented to Place, Oriented to Self Alcohol / Substance Use: Not Applicable Psych Involvement: No (comment)  Admission diagnosis:  SBO (small bowel obstruction) ([place]) [K56.609] Patient Active Problem List   Diagnosis Date Noted  . Malnutrition of moderate degree 02/25/2019  . SBO (small bowel obstruction) ([place]) 02/21/2019  . Hypertrophic cardiomyopathy ([place]) 01/04/2019  . Hypertensive heart disease 12/24/2016  . Chronic diastolic heart failure ([place]) 09/11/2015  . DOE (dyspnea on exertion) 09/11/2015  . Dyspnea   . Encounter for palliative care   . Acute diastolic heart failure secondary to hypertrophic cardiomyopathy ([place]) 03/06/2015  . Mild tricuspid regurgitation 03/06/2015  . Moderate mitral regurgitation 03/06/2015  . Acute on chronic hyponatremia 03/06/2015  . Hypothyroidism 03/06/2015  . HTN (hypertension) 03/06/2015  . Anemia of chronic disease 03/06/2015  . CKD (chronic kidney disease), stage III ([place]) 03/06/2015  . Moderate aortic stenosis 03/06/2015  . Acute respiratory failure with hypoxia ([place]) 03/06/2015   PCP:  Lajean Manes, MD Pharmacy:   [facility], [place] - [address] [place] [address] Phone: [phone] Fax: [phone]     Social Determinants of Health (SDOH) Interventions    Readmission Risk Interventions No flowsheet data found.

## 2019-03-02 NOTE — Telephone Encounter (Signed)
Dr. Radford Pax reached out to let us know that patient's appt should be 3-4 weeks after DC so will not be TOC. I rescheduled to Gerrianne Scale 8/26. It does appear she had previously gotten consent for a prior virtual visit, so that's taken care of. We are signing off today so it's not clear me what her destination will be after this visit so closer to time of visit will need updated phone number added to her appt notes. Thanks,  Melina Copa PA-C

## 2019-03-02 NOTE — Progress Notes (Addendum)
Pt tolerated the NG tube clamping for 4 hours and back to Intermittent wall suction for 2 hours. Pt denies any nausea.   1625 Pt voided 200 mL urine, bladder scanned showed 260 mL. Notified MD [place] via secure message. MD put in order for in and out cath PRN for bladder scans over 200 mL.    Paulla Fore, RN

## 2019-03-02 NOTE — Plan of Care (Signed)
Continue PT,ambulation to decrease activity intolerance and aid in gastric motility.

## 2019-03-03 ENCOUNTER — Inpatient Hospital Stay (HOSPITAL_COMMUNITY): Payer: Medicare HMO

## 2019-03-03 LAB — CBC WITH DIFFERENTIAL/PLATELET
Abs Immature Granulocytes: 0.13 K/uL — ABNORMAL HIGH (ref 0.00–0.07)
Basophils Absolute: 0 K/uL (ref 0.0–0.1)
Basophils Relative: 0 %
Eosinophils Absolute: 0.2 K/uL (ref 0.0–0.5)
Eosinophils Relative: 1 %
HCT: 36.2 % (ref 36.0–46.0)
Hemoglobin: 11.6 g/dL — ABNORMAL LOW (ref 12.0–15.0)
Immature Granulocytes: 1 %
Lymphocytes Relative: 6 %
Lymphs Abs: 0.8 K/uL (ref 0.7–4.0)
MCH: 32.9 pg (ref 26.0–34.0)
MCHC: 32 g/dL (ref 30.0–36.0)
MCV: 102.5 fL — ABNORMAL HIGH (ref 80.0–100.0)
Monocytes Absolute: 0.7 K/uL (ref 0.1–1.0)
Monocytes Relative: 5 %
Neutro Abs: 11.5 K/uL — ABNORMAL HIGH (ref 1.7–7.7)
Neutrophils Relative %: 87 %
Platelets: 177 K/uL (ref 150–400)
RBC: 3.53 MIL/uL — ABNORMAL LOW (ref 3.87–5.11)
RDW: 14.6 % (ref 11.5–15.5)
WBC: 13.3 K/uL — ABNORMAL HIGH (ref 4.0–10.5)
nRBC: 0 % (ref 0.0–0.2)

## 2019-03-03 LAB — BLOOD GAS, ARTERIAL
Acid-Base Excess: 0 mmol/L (ref 0.0–2.0)
Bicarbonate: 22.4 mmol/L (ref 20.0–28.0)
Drawn by: 39899
O2 Content: 2 L/min
O2 Saturation: 97.7 %
Patient temperature: 98.6
pCO2 arterial: 26 mmHg — ABNORMAL LOW (ref 32.0–48.0)
pH, Arterial: 7.545 — ABNORMAL HIGH (ref 7.350–7.450)
pO2, Arterial: 94.6 mmHg (ref 83.0–108.0)

## 2019-03-03 LAB — GLUCOSE, CAPILLARY
Glucose-Capillary: 120 mg/dL — ABNORMAL HIGH (ref 70–99)
Glucose-Capillary: 125 mg/dL — ABNORMAL HIGH (ref 70–99)
Glucose-Capillary: 132 mg/dL — ABNORMAL HIGH (ref 70–99)
Glucose-Capillary: 133 mg/dL — ABNORMAL HIGH (ref 70–99)
Glucose-Capillary: 150 mg/dL — ABNORMAL HIGH (ref 70–99)
Glucose-Capillary: 153 mg/dL — ABNORMAL HIGH (ref 70–99)

## 2019-03-03 LAB — COMPREHENSIVE METABOLIC PANEL
ALT: 20 U/L (ref 0–44)
AST: 34 U/L (ref 15–41)
Albumin: 2.1 g/dL — ABNORMAL LOW (ref 3.5–5.0)
Alkaline Phosphatase: 78 U/L (ref 38–126)
Anion gap: 9 (ref 5–15)
BUN: 37 mg/dL — ABNORMAL HIGH (ref 8–23)
CO2: 24 mmol/L (ref 22–32)
Calcium: 8.2 mg/dL — ABNORMAL LOW (ref 8.9–10.3)
Chloride: 111 mmol/L (ref 98–111)
Creatinine, Ser: 0.97 mg/dL (ref 0.44–1.00)
GFR calc Af Amer: 56 mL/min — ABNORMAL LOW (ref >60)
GFR calc non Af Amer: 49 mL/min — ABNORMAL LOW (ref >60)
Glucose, Bld: 157 mg/dL — ABNORMAL HIGH (ref 70–99)
Potassium: 4 mmol/L (ref 3.5–5.1)
Sodium: 144 mmol/L (ref 135–145)
Total Bilirubin: 0.4 mg/dL (ref 0.3–1.2)
Total Protein: 5.7 g/dL — ABNORMAL LOW (ref 6.5–8.1)

## 2019-03-03 LAB — PHOSPHORUS: Phosphorus: 3.2 mg/dL (ref 2.5–4.6)

## 2019-03-03 LAB — MAGNESIUM: Magnesium: 1.9 mg/dL (ref 1.7–2.4)

## 2019-03-03 MED ORDER — SODIUM CHLORIDE 0.9 % IV SOLN
1.5000 g | Freq: Three times a day (TID) | INTRAVENOUS | Status: DC
  Administered 2019-03-03 – 2019-03-06 (×10): 1.5 g via INTRAVENOUS
  Filled 2019-03-03 (×6): qty 1.5
  Filled 2019-03-03 (×2): qty 4
  Filled 2019-03-03 (×2): qty 1.5
  Filled 2019-03-03: qty 4
  Filled 2019-03-03: qty 1.5

## 2019-03-03 MED ORDER — TRAVASOL 10 % IV SOLN
INTRAVENOUS | Status: AC
  Administered 2019-03-03: 18:00:00 via INTRAVENOUS
  Filled 2019-03-03: qty 705.6

## 2019-03-03 MED ORDER — INSULIN ASPART 100 UNIT/ML ~~LOC~~ SOLN
0.0000 [IU] | Freq: Four times a day (QID) | SUBCUTANEOUS | Status: DC
  Administered 2019-03-03: 1 [IU] via SUBCUTANEOUS
  Administered 2019-03-03: 2 [IU] via SUBCUTANEOUS
  Administered 2019-03-04: 1 [IU] via SUBCUTANEOUS

## 2019-03-03 MED ORDER — IPRATROPIUM-ALBUTEROL 0.5-2.5 (3) MG/3ML IN SOLN
3.0000 mL | Freq: Four times a day (QID) | RESPIRATORY_TRACT | Status: DC
  Administered 2019-03-03: 3 mL via RESPIRATORY_TRACT

## 2019-03-03 NOTE — Progress Notes (Signed)
Pharmacy Antibiotic Note  Tina Weaver is a 85 y.o. female admitted on 02/20/2019 with SBO and prolonged NPO - now with concern for aspiration PNA.  Pharmacy has been consulted for Unasyn dosing.  Plan: - Start Unasyn 1.5g IV every 8 hours - Will continue to follow renal function, culture results, LOT, and antibiotic de-escalation plans   Height: 5' (152.4 cm) Weight: 99 lb 3.3 oz (45 kg) IBW/kg (Calculated) : 45.5  Temp (24hrs), Avg:98.1 F (36.7 C), Min:97.8 F (36.6 C), Max:98.3 F (36.8 C)  Recent Labs  Lab 02/25/19 0519 02/26/19 0345  02/27/19 0419 02/27/19 0825 02/28/19 0502 03/01/19 0337 03/03/19 0449  WBC 10.2 9.3  --  9.9  --  11.2*  --  13.3*  CREATININE 1.31* 1.03*   < > 1.01* 0.93 1.00 0.95 0.97   < > = values in this interval not displayed.    Estimated Creatinine Clearance: 23 mL/min (by C-G formula based on SCr of 0.97 mg/dL).    Allergies  Allergen Reactions  . Norvasc [Amlodipine] Swelling    Lower extremity edema with 10 mg dose, tolerates 5mg  without problems. Lower extremity edema with 10 mg dose, tolerates 5mg  without problems.  . Alendronate Other (See Comments)    Pain to ext's  . Fosamax [Alendronate Sodium] Other (See Comments)    "Pain to legs" per pt's family member    Antimicrobials this admission: Unasyn 8/6 >>  Dose adjustments this admission: n/a  Microbiology results: 7/27 COVID >> neg 8/3 MRSA PCR >> neg  Thank you for allowing pharmacy to be a part of this patient's care.  Alycia Rossetti, PharmD, BCPS Clinical Pharmacist Clinical phone for 03/03/2019: [phone] 03/03/2019 11:05 AM   **Pharmacist phone directory can now be found on [email or web address] (PW [email or web address]).  Listed under [place].

## 2019-03-03 NOTE — Progress Notes (Signed)
3 Days Post-Op  Subjective: CC: Abdominal pain Patient complains of diffuse abdominal pain. Currently NGT is clamped. Tolerated this overnight. No N/V. Passing flatus and had a soft BM this AM. She has an increased respiratory rate while I am in the room but denies CP or SOB.   Objective: Vital signs in last 24 hours: Temp:  [97.8 F (36.6 C)-98.3 F (36.8 C)] 98 F (36.7 C) (08/06 0933) Pulse Rate:  [78-97] 89 (08/06 0933) Resp:  [18-20] 18 (08/06 0933) BP: (152-184)/(60-79) 184/79 (08/06 1026) SpO2:  [95 %-98 %] 98 % (08/06 0933) Last BM Date: 02/23/19  Intake/Output from previous day: 08/05 0701 - 08/06 0700 In: 771.4 [I.V.:771.4] Out: 1350 [Urine:950; Emesis/NG output:400] Intake/Output this shift: No intake/output data recorded.  PE: Gen:  Alert, pleasant,  Card:  Regular Pulm:  tachypneic. Distant breath sounds upper lung fields. Clear at bases.  Abd: Soft, minimal distended. Generalized tenderness without r/r/g. +BS. NGT clamped. Ext:  No edema  Lab Results:  Recent Labs    03/03/19 0449  WBC 13.3*  HGB 11.6*  HCT 36.2  PLT 177   BMET Recent Labs    03/01/19 0337 03/03/19 0449  NA 141 144  K 4.8 4.0  CL 107 111  CO2 25 24  GLUCOSE 133* 157*  BUN 35* 37*  CREATININE 0.95 0.97  CALCIUM 8.4* 8.2*   PT/INR No results for input(s): LABPROT, INR in the last 72 hours. CMP     Component Value Date/Time   NA 144 03/03/2019 0449   K 4.0 03/03/2019 0449   CL 111 03/03/2019 0449   CO2 24 03/03/2019 0449   GLUCOSE 157 (H) 03/03/2019 0449   BUN 37 (H) 03/03/2019 0449   CREATININE 0.97 03/03/2019 0449   CREATININE 1.00 (H) 05/02/2016 0830   CALCIUM 8.2 (L) 03/03/2019 0449   PROT 5.7 (L) 03/03/2019 0449   ALBUMIN 2.1 (L) 03/03/2019 0449   AST 34 03/03/2019 0449   ALT 20 03/03/2019 0449   ALKPHOS 78 03/03/2019 0449   BILITOT 0.4 03/03/2019 0449   GFRNONAA 49 (L) 03/03/2019 0449   GFRAA 56 (L) 03/03/2019 0449   Lipase     Component Value  Date/Time   LIPASE 34 02/20/2019 2253       Studies/Results: Dg Chest Port 1 View  Result Date: 03/03/2019 CLINICAL DATA:  Leukocytosis and short of breath EXAM: PORTABLE CHEST 1 VIEW COMPARISON:  05/15/2015 FINDINGS: NG tube in the stomach. Left arm PICC tip in the SVC at the cavoatrial junction. Elevated right hemidiaphragm with decreased lung volume compared to the prior study. Negative for edema or effusion. Possible right upper lobe airspace disease.  Follow-up recommended. IMPRESSION: Possible right upper lobe infiltrate. Follow-up chest x-ray recommended. Electronically Signed   By: Franchot Gallo M.D.   On: 03/03/2019 08:39    Anti-infectives: Anti-infectives (From admission, onward)   Start     Dose/Rate Route Frequency Ordered Stop   02/28/19 0721  ceFAZolin (ANCEF) 2-4 GM/100ML-% IVPB    Note to Pharmacy: Laurita Quint   : cabinet override      02/28/19 0721 02/28/19 1929   02/27/19 1230  ceFAZolin (ANCEF) IVPB 2g/100 mL premix     2 g 200 mL/hr over 30 Minutes Intravenous  Once 02/27/19 1158 02/27/19 1456       Assessment/Plan Hypernatremia/hyperchloremia/hypokalemia - resolved Hx hypertension Hx chronic diastolic congestive heart failure Hx stage III kidney disease Hypothyroid Urinary retention- Getting I/O cath currently  SBO due to adhesions  Diagnostic laparoscopy with lysis of adhesions 02/28/2019 Dr. Ralene Ok. - POD 3 - WBC up to 13 (from 11). CXR appears to have RUL infiltrate which is likely the source.  - Leave NGT clamped and see if she tolerates CLD.  - Will recheck this afternoon, possible pull NGT and advance to FLD - Mobilize with PT/OT  FEN: CLD. TPN ID: Ancef preop DVT: Lovenox Follow-up: Dr. Rosendo Gros POC: [place], Pauli Son [phone]    Mellody, Masri [phone]  [phone]  Emely, Fahy Other   [phone]      LOS: 10 days    [name] , [facility] Surgery 03/03/2019, 10:30 AM Pager:  [phone]

## 2019-03-03 NOTE — Progress Notes (Signed)
PT Cancellation Note  Patient Details Name: Tina Weaver MRN: [id number] DOB: [birth year]   Cancelled Treatment:     RN requesting therapy hold at this time, patient with SOB noted. Will cont to follow.   Reinaldo Berber, PT, DPT Acute Rehabilitation Services Pager: [phone] Office: [phone]     Reinaldo Berber 03/03/2019, 10:37 AM

## 2019-03-03 NOTE — Progress Notes (Signed)
PROGRESS NOTE  MORGANN WOODBURN VQM:[id number] DOB: 1919-01-11 DOA: 02/20/2019 PCP: Lajean Manes, MD   LOS: 10 days   Brief narrative: Braedyn Riggle Bookeris a 85 y.o.femalewith medical history significant ofHTN, chronic diastolic CHF with hypertensive cardiomyopathy, AS. Patient presentedto the ED on 7/27with c/o abd pain for 3 days associated with nausea, generalized weakness. CT of the abdomen and pelvis demonstrated a high grade SBO. Patient was admitted for small bowel obstruction. General surgery was consulted andpatient was started on conservative management with IV fluid, bowel rest, pain medicine and NG tube suction. Repeat abdominal x-ray 7/30 slight interval increase in the size of the small bowel loops. Despite multiple days of conservative methods,patient did not clinically or radiographically improve. General surgery had multiple discussions with patient and her son/POA. Patient ultimately underwent diagnostic laparoscopy with lysis of adhesions on 8/3.  Subjective: Patient seen and examined at bedside today, met patient significantly tachypneic after she was just given a bath.  Patient denies any chest pain, cough, fever/chills.  Noted to be in significant pain, likely the cause of her tachypnea.  Vital signs stable satting 98 on room air, heart rate in the 80s, blood pressure a little bit elevated likely due to pain.  Patient was placed on oxygen for comfort, breathing treatments given.     Assessment/Plan:  Principal Problem:   SBO (small bowel obstruction) (HCC) Active Problems:   HTN (hypertension)   CKD (chronic kidney disease), stage III (HCC)   Chronic diastolic heart failure (HCC)   Malnutrition of moderate degree  High-grade small bowel obstruction status post laparoscopic adhesion lysis on 8/3 by general surgery CT scan finding as above. Failed conservative management. Currently has postoperative ileus.  Awaiting bowel function. Continue TPN, advance to clear  liquid diet as per general surgery General surgery on board  ??Possible aspiration pneumonia/HCAP Noted to have sudden tachypnea, although saturating well on room air Afebrile, with leukocytosis ABG with pH of 7.54, CO2 26, PO2 94.6, bicarb 22.4 Chest x-ray with right upper lobe infiltrate SLP consulted Nebulizers, supplemental oxygen PRN Start IV Unasyn Monitor closely  Cardiovascular issues:HTN,HLD, chronic diastolic CHF,moderate aortic stenosis, coronary artery disease, HOCM, CKD stage III Prior to admission, patient was on Coreg 18.75 mg twice daily, Lasix 20 mg dailyandCrestor 5 mg daily. Not symptomatic from heart failure, aortic stenosis or HOCM. Cardiology consult appreciated.  Hypothyroidism Continue Synthroid   Mobility:Encourage ambulation.  Was able to ambulate prior to hospitalization. Diet:CLD DVT prophylaxis:Lovenox Code Status:Code Status: DNR Family Communication:None at bedside Expected Discharge:To be determined, likely SNF  Consultants:  General surgery  Procedures:  Laparoscopic adhesional lysis on 8/3  Antimicrobials:  Anti-infectives (From admission, onward)   Start     Dose/Rate Route Frequency Ordered Stop   03/03/19 1200  ampicillin-sulbactam (UNASYN) 1.5 g in sodium chloride 0.9 % 100 mL IVPB     1.5 g 200 mL/hr over 30 Minutes Intravenous Every 8 hours 03/03/19 1103     02/28/19 0721  ceFAZolin (ANCEF) 2-4 GM/100ML-% IVPB    Note to Pharmacy: Laurita Quint   : cabinet override      02/28/19 0721 02/28/19 1929   02/27/19 1230  ceFAZolin (ANCEF) IVPB 2g/100 mL premix     2 g 200 mL/hr over 30 Minutes Intravenous  Once 02/27/19 1158 02/27/19 1456      Infusions:  . sodium chloride 250 mL (02/23/19 0955)  . ampicillin-sulbactam (UNASYN) IV 1.5 g (03/03/19 1233)  . TPN ADULT (ION) 60 mL/hr at 03/02/19 1706  .  TPN ADULT (ION)      Scheduled Meds: . enoxaparin (LOVENOX) injection  30 mg Subcutaneous Q24H  . insulin  aspart  0-9 Units Subcutaneous Q6H  . levothyroxine  88 mcg Oral Daily  . mouth rinse  15 mL Mouth Rinse BID  . sodium chloride flush  3 mL Intravenous Once    PRN meds: sodium chloride, acetaminophen **OR** acetaminophen, fentaNYL (SUBLIMAZE) injection, hydrALAZINE, ipratropium-albuterol, menthol-cetylpyridinium, metoprolol tartrate, morphine injection, ondansetron **OR** ondansetron (ZOFRAN) IV, phenol, sodium chloride flush   Objective: Vitals:   03/03/19 1046 03/03/19 1235  BP:  (!) 144/60  Pulse: 88 92  Resp: (!) 30 18  Temp:    SpO2: 99% 98%    Intake/Output Summary (Last 24 hours) at 03/03/2019 1534 Last data filed at 03/03/2019 1400 Gross per 24 hour  Intake 1585.43 ml  Output 1700 ml  Net -114.57 ml   Filed Weights   02/21/19 2130 02/22/19 2106 02/25/19 0300  Weight: 50.5 kg 50.5 kg 45 kg   Weight change:  Body mass index is 19.38 kg/m.   Physical Exam:  General: Mild distress, tachypneic  Cardiovascular: S1, S2 present  Respiratory: CTAB  Abdomen: Soft, generalized tenderness, nondistended, bowel sounds present  Musculoskeletal: No bilateral pedal edema noted  Skin: Normal  Psychiatry: Normal mood   Data Review: I have personally reviewed the laboratory data and studies available.  Recent Labs  Lab 02/25/19 0519 02/26/19 0345 02/27/19 0419 02/28/19 0502 03/03/19 0449  WBC 10.2 9.3 9.9 11.2* 13.3*  NEUTROABS 7.0 6.3 6.8 7.4 11.5*  HGB 11.5* 12.6 12.7 12.8 11.6*  HCT 35.7* 39.4 38.8 39.8 36.2  MCV 100.0 102.9* 104.6* 101.8* 102.5*  PLT 180 181 147* 186 177   Recent Labs  Lab 02/26/19 0345 02/26/19 2000 02/27/19 0419 02/27/19 0825 02/28/19 0502 03/01/19 0337 03/03/19 0449  NA 143 140 138 142 143 141 144  K 3.2* 3.0* 5.8* 3.5 3.6 4.8 4.0  CL 104 96* 97* 101 103 107 111  CO2 30 31 32 33* _0 GLUCOSE 146* 126* 488* 138* 91 133* 157*  BUN 26* 23 24* 24* 28* 35* 37*  CREATININE 1.03* 0.88 1.01* 0.93 1.00 0.95 0.97  CALCIUM 8.3*  8.2* 8.4* 8.3* 8.4* 8.4* 8.2*  MG 1.9  --   --   --  1.8  --  1.9  PHOS 2.4* 3.2 4.3 3.2 3.0  --  3.2    Alma Friendly, MD  [facility] 03/03/2019

## 2019-03-03 NOTE — Progress Notes (Signed)
RT NOTE: RT obtained ordered ABG from patient with following results: pH 7.50, CO2 26, PaO2 94.6, and HCO3 22.4. Patient is sating 100% on 2L [place]. Patient is in a great amount of pain which seems to be causing her tachypnea. RN was working on getting patient pain medication when RT left room. Vitals are stable. RT will continue to monitor.

## 2019-03-03 NOTE — Progress Notes (Signed)
PHARMACY - ADULT TOTAL PARENTERAL NUTRITION CONSULT NOTE   Pharmacy Consult for TPN Indication: bowel obstruction  Patient Measurements: Height: 5' (152.4 cm) Weight: 99 lb 3.3 oz (45 kg) IBW/kg (Calculated) : 45.5 TPN AdjBW (KG): 50.8 Body mass index is 19.38 kg/m.  Assessment:  85 year old female admitted 7/26 with increased abdominal pain and nausea for 3 days and found to have high-grade SBO with NG tube placed. Prior to admission, patient lived alone and completed all activities of daily living independently. NGT output increased on 7/30. Patient has been NPO for 6 days here and reports poor PO intake for 3 days prior to admission due to symptoms. Pharmacy consulted for TPN.    GI: High grade SBO - surgery 8/3 with lysis of adhesion. LBM 7/29. Pre-albumin 11.3>>10.4. NGT output 725 cc/24hrs. Tolerated NGT clamp 8/5, to trial CLD today, possibly advance to FLD and pull NGT if tolerates this afternoon -TPN was leaking am 8/5, so D10 hung at 50 mL/hr from about 1045 until new bag of TPN hung Endo: No history of DM. CBGs 125-157 Insulin requirements in the past 24 hours: 2 units On PTA synthroid Lytes: All WNL Renal: SCr stable 0.97, BUN up to 37. UOP 0.9 mL/kg/hr Pulm: RA  Cards: BP elevated - PRN hydralazine. HR controlled Hepatobil: LFTs / Tbili / TG WNL Neuro: Pain controlled ID: Afebrile, WBC up to 13.3. S/p peri-op antibiotics.   TPN Access: PICC placement 7/31 TPN start date: 7/31 Nutritional Goals (per RD recommendation on 8/5): KCal: 1250-1450 Protein: 60-75 g Fluid: >/= 1.3L  Goal TPN rate is ~60 ml/hr (provides 71g protein, 190g dextrose, and 47g of lipids and 1397 kcals, meeting 100% of patient's needs)   Current Nutrition:  TPN CLD  Plan:  Continue TPN at goal rate 60 mL/hr  Electrolytes in TPN: no change today; continue Cl:Ac 2:1 Continue MVI daily, Trace elements MWF due to national shortage  Adjust sensitive SSI to q6hr and monitor CBGs Monitor TPN  labs, f/u toleration of diet and ability to wean TPN  Elicia Lamp, PharmD, BCPS Please check AMION for all [facility] contact numbers Clinical Pharmacist 03/03/2019 8:02 AM

## 2019-03-03 NOTE — Progress Notes (Signed)
When [place], NT and I entered the room this morning, patients was breathing normal and vital sins were stable. Bath was performed along with bladder scan. Bladder scan showed 600 cc of urine in patients bladder. Patient assisted to [facility] and urinated 100 cc. In and out cathter performed per standing order and 500 cc of urine removed. At approximately 1000, patient devolped tachypnea with respirations of 52. Willodean Rosenthal, MD at bedside. MD gave verbal order to obtain vital signs, which are charted, and place patient on 2L of oxygen for comfort. Orders followed. Respiratory at bedside and obtained ABG's. Results in chart. Patient complaining of pain. PRN morphine given. Will continue to monitor patient.  Aneta Mins BSN, RN3

## 2019-03-04 ENCOUNTER — Inpatient Hospital Stay (HOSPITAL_COMMUNITY): Payer: Medicare HMO

## 2019-03-04 LAB — CBC WITH DIFFERENTIAL/PLATELET
Abs Immature Granulocytes: 0.09 10*3/uL — ABNORMAL HIGH (ref 0.00–0.07)
Basophils Absolute: 0 10*3/uL (ref 0.0–0.1)
Basophils Relative: 0 %
Eosinophils Absolute: 0.2 10*3/uL (ref 0.0–0.5)
Eosinophils Relative: 2 %
HCT: 30.2 % — ABNORMAL LOW (ref 36.0–46.0)
Hemoglobin: 9.9 g/dL — ABNORMAL LOW (ref 12.0–15.0)
Immature Granulocytes: 1 %
Lymphocytes Relative: 7 %
Lymphs Abs: 0.9 10*3/uL (ref 0.7–4.0)
MCH: 33.2 pg (ref 26.0–34.0)
MCHC: 32.8 g/dL (ref 30.0–36.0)
MCV: 101.3 fL — ABNORMAL HIGH (ref 80.0–100.0)
Monocytes Absolute: 1 10*3/uL (ref 0.1–1.0)
Monocytes Relative: 8 %
Neutro Abs: 10.9 10*3/uL — ABNORMAL HIGH (ref 1.7–7.7)
Neutrophils Relative %: 82 %
Platelets: 164 10*3/uL (ref 150–400)
RBC: 2.98 MIL/uL — ABNORMAL LOW (ref 3.87–5.11)
RDW: 14.7 % (ref 11.5–15.5)
WBC: 13.1 10*3/uL — ABNORMAL HIGH (ref 4.0–10.5)
nRBC: 0 % (ref 0.0–0.2)

## 2019-03-04 LAB — HEMOGLOBIN A1C
Hgb A1c MFr Bld: 6 % — ABNORMAL HIGH (ref 4.8–5.6)
Mean Plasma Glucose: 125.5 mg/dL

## 2019-03-04 LAB — GLUCOSE, CAPILLARY
Glucose-Capillary: 132 mg/dL — ABNORMAL HIGH (ref 70–99)
Glucose-Capillary: 162 mg/dL — ABNORMAL HIGH (ref 70–99)
Glucose-Capillary: 124 mg/dL — ABNORMAL HIGH (ref 70–99)
Glucose-Capillary: 212 mg/dL — ABNORMAL HIGH (ref 70–99)

## 2019-03-04 LAB — D-DIMER, QUANTITATIVE: D-Dimer, Quant: 6.41 ug/mL-FEU — ABNORMAL HIGH (ref 0.00–0.50)

## 2019-03-04 MED ORDER — IOHEXOL 350 MG/ML SOLN
50.0000 mL | Freq: Once | INTRAVENOUS | Status: AC | PRN
  Administered 2019-03-04: 50 mL via INTRAVENOUS

## 2019-03-04 MED ORDER — INSULIN ASPART 100 UNIT/ML ~~LOC~~ SOLN
0.0000 [IU] | Freq: Three times a day (TID) | SUBCUTANEOUS | Status: DC
  Administered 2019-03-04: 2 [IU] via SUBCUTANEOUS
  Administered 2019-03-04 – 2019-03-08 (×6): 1 [IU] via SUBCUTANEOUS
  Administered 2019-03-08: 2 [IU] via SUBCUTANEOUS
  Administered 2019-03-09: 1 [IU] via SUBCUTANEOUS
  Administered 2019-03-09 – 2019-03-11 (×2): 2 [IU] via SUBCUTANEOUS

## 2019-03-04 MED ORDER — RESOURCE THICKENUP CLEAR PO POWD
ORAL | Status: DC | PRN
  Filled 2019-03-04: qty 125

## 2019-03-04 NOTE — Progress Notes (Signed)
Physical Therapy Treatment Patient Details Name: Tina Weaver MRN: [id number] DOB: 1919/01/20 Today's Date: 03/04/2019    History of Present Illness Pt is a 85 y/o female admitted secondary to increased abdominal pain. Found to have SBO and had NG tube placed; now s/p laparoscopy and lysis of adhesions on 8/3; . PMH includes HTN, cardiomyopathy, d HF, and aortic stenosis.     PT Comments    Pt seen again at family request, ambualting slightly further distances this visit from AM session. Did not become tachypneic as quickly, HR 100, SpO2 96% on RA with 70' of walking.   Follow Up Recommendations  Home health PT;Supervision/Assistance - 24 hour     Equipment Recommendations  Rolling walker with 5" wheels(youth sized RW)    Recommendations for Other Services       Precautions / Restrictions Precautions Precautions: Fall Precaution Comments: watch RR Restrictions Weight Bearing Restrictions: No    Mobility  Bed Mobility Overal bed mobility: Modified Independent                Transfers Overall transfer level: Needs assistance Equipment used: Rolling walker (2 wheeled) Transfers: Sit to/from Stand Sit to Stand: Min assist         General transfer comment: min A to stand EOB with RW. unable to do so on her own from neutral height  Ambulation/Gait Ambulation/Gait assistance: Min guard Gait Distance (Feet): 60 Feet Assistive device: Rolling walker (2 wheeled) Gait Pattern/deviations: Step-through pattern;Decreased stride length;Trunk flexed Gait velocity: decreased   General Gait Details: increasd distance, VSS on RA, no LOB with RW   Stairs             Wheelchair Mobility    Modified Rankin (Stroke Patients Only)       Balance Overall balance assessment: Needs assistance Sitting-balance support: No upper extremity supported;Feet supported Sitting balance-Leahy Scale: Good     Standing balance support: Bilateral upper extremity  supported;During functional activity Standing balance-Leahy Scale: Poor Standing balance comment: Reliant on BUE support                             Cognition Arousal/Alertness: Awake/alert Behavior During Therapy: WFL for tasks assessed/performed;Flat affect Overall Cognitive Status: Difficult to assess                                 General Comments: overall appears appropriate and following commands, but pt not very talkative and soft spoken, so difficult to fully assess       Exercises      General Comments        Pertinent Vitals/Pain Faces Pain Scale: Hurts a little bit Pain Location: stomach Pain Descriptors / Indicators: Aching    Home Living                      Prior Function            PT Goals (current goals can now be found in the care plan section) Acute Rehab PT Goals Patient Stated Goal: to go home PT Goal Formulation: With patient Time For Goal Achievement: 03/09/19 Potential to Achieve Goals: Good    Frequency    Min 3X/week      PT Plan Current plan remains appropriate    Co-evaluation              AM-PAC PT "6  Clicks" Mobility   Outcome Measure  Help needed turning from your back to your side while in a flat bed without using bedrails?: A Little Help needed moving from lying on your back to sitting on the side of a flat bed without using bedrails?: A Little Help needed moving to and from a bed to a chair (including a wheelchair)?: A Little Help needed standing up from a chair using your arms (e.g., wheelchair or bedside chair)?: A Lot Help needed to walk in hospital room?: A Little Help needed climbing 3-5 steps with a railing? : A Lot 6 Click Score: 16    End of Session Equipment Utilized During Treatment: Gait belt Activity Tolerance: Patient tolerated treatment well Patient left: in chair;with call bell/phone within reach Nurse Communication: Mobility status PT Visit Diagnosis: Other  abnormalities of gait and mobility (R26.89);Muscle weakness (generalized) (M62.81)     Time: 1700-1749 PT Time Calculation (min) (ACUTE ONLY): 17 min  Charges:  $Gait Training: 8-22 mins                     Reinaldo Berber, PT, DPT Acute Rehabilitation Services Pager: [phone] Office: [phone]     Reinaldo Berber 03/04/2019, 6:18 PM

## 2019-03-04 NOTE — Progress Notes (Signed)
PROGRESS NOTE  Tina Weaver [id number]:[id number] DOB: Dec 15, 1918 DOA: 02/20/2019 PCP: Lajean Manes, MD   LOS: 11 days   Brief narrative: Kenslei Hearty Bookeris a 85 y.o.femalewith medical history significant ofHTN, chronic diastolic CHF with hypertensive cardiomyopathy, AS. Patient presentedto the ED on 7/27with c/o abd pain for 3 days associated with nausea, generalized weakness. CT of the abdomen and pelvis demonstrated a high grade SBO. Patient was admitted for small bowel obstruction. General surgery was consulted andpatient was started on conservative management with IV fluid, bowel rest, pain medicine and NG tube suction. Repeat abdominal x-ray 7/30 slight interval increase in the size of the small bowel loops. Despite multiple days of conservative methods,patient did not clinically or radiographically improve. General surgery had multiple discussions with patient and her son/POA. Patient ultimately underwent diagnostic laparoscopy with lysis of adhesions on 8/3.  Subjective: Patient seen and examined at bedside.  Remains tachypneic, denies any significant abdominal pain, nausea/vomiting, chest pain, fever/chills.  Noted to be aspirating as per SLP.  Son at bedside, discussed extensively about management     Assessment/Plan:  Principal Problem:   SBO (small bowel obstruction) ([place]) Active Problems:   HTN (hypertension)   CKD (chronic kidney disease), stage III (HCC)   Chronic diastolic heart failure (HCC)   Malnutrition of moderate degree  High-grade small bowel obstruction status post laparoscopic adhesion lysis on 8/3 by general surgery CT scan finding as above. Failed conservative management. Currently had postoperative ileus Continue TPN, advance to dysphagia diet, nectar thick General surgery on board  ??Possible aspiration pneumonia/HCAP Noted to have sudden tachypnea, although saturating well on room air Afebrile, with leukocytosis ABG with pH of 7.54, CO2 26, PO2 94.6,  bicarb 22.4 Chest x-ray with right upper lobe infiltrate SLP consulted Nebulizers, supplemental oxygen PRN Continue IV Unasyn Monitor closely  Elevated d-dimer Could be due to recent surgery, currently tachypneic, although not hypoxic or tachycardic Rule out PE, CTA chest pending Monitor closely  Dysphagia SLP on board Change diet to dysphagia 3, nectar thick liquid Aspiration precautions  Cardiovascular issues:HTN,HLD, chronic diastolic CHF,moderate aortic stenosis, coronary artery disease, HOCM, CKD stage III Prior to admission, patient was on Coreg 18.75 mg twice daily, Lasix 20 mg dailyandCrestor 5 mg daily. Not symptomatic from heart failure, aortic stenosis or HOCM. Cardiology consult appreciated.  Hypothyroidism Continue Synthroid   Mobility:Encourage ambulation.  Was able to ambulate prior to hospitalization. Diet:CLD DVT prophylaxis:Lovenox Code Status:Code Status: DNR Family Communication:Son at bedside Expected Discharge:To be determined, likely home with home health  Consultants:  General surgery  Procedures:  Laparoscopic adhesional lysis on 8/3  Antimicrobials:  Anti-infectives (From admission, onward)   Start     Dose/Rate Route Frequency Ordered Stop   03/03/19 1200  ampicillin-sulbactam (UNASYN) 1.5 g in sodium chloride 0.9 % 100 mL IVPB     1.5 g 200 mL/hr over 30 Minutes Intravenous Every 8 hours 03/03/19 1103     02/28/19 0721  ceFAZolin (ANCEF) 2-4 GM/100ML-% IVPB    Note to Pharmacy: Laurita Quint   : cabinet override      02/28/19 0721 02/28/19 1929   02/27/19 1230  ceFAZolin (ANCEF) IVPB 2g/100 mL premix     2 g 200 mL/hr over 30 Minutes Intravenous  Once 02/27/19 1158 02/27/19 1456      Infusions:  . sodium chloride 250 mL (02/23/19 0955)  . ampicillin-sulbactam (UNASYN) IV 1.5 g (03/04/19 1121)  . TPN ADULT (ION) 60 mL/hr at 03/03/19 1802    Scheduled Meds: . enoxaparin (  LOVENOX) injection  30 mg  Subcutaneous Q24H  . insulin aspart  0-9 Units Subcutaneous TID WC  . levothyroxine  88 mcg Oral Daily  . mouth rinse  15 mL Mouth Rinse BID  . sodium chloride flush  3 mL Intravenous Once    PRN meds: sodium chloride, acetaminophen **OR** acetaminophen, fentaNYL (SUBLIMAZE) injection, hydrALAZINE, ipratropium-albuterol, menthol-cetylpyridinium, metoprolol tartrate, morphine injection, ondansetron **OR** ondansetron (ZOFRAN) IV, phenol, Resource ThickenUp Clear, sodium chloride flush   Objective: Vitals:   03/04/19 0443 03/04/19 0929  BP: (!) 155/57 (!) 159/51  Pulse: 74 76  Resp: 16 18  Temp: 97.8 F (36.6 C) 98.3 F (36.8 C)  SpO2: 99% 95%    Intake/Output Summary (Last 24 hours) at 03/04/2019 1642 Last data filed at 03/04/2019 1600 Gross per 24 hour  Intake 2290.89 ml  Output 720 ml  Net 1570.89 ml   Filed Weights   02/22/19 2106 02/25/19 0300 03/03/19 2218  Weight: 50.5 kg 45 kg 44.3 kg   Weight change:  Body mass index is 19.07 kg/m.   Physical Exam:  General: NAD, tachypneic  Cardiovascular: S1, S2 present  Respiratory: CTAB  Abdomen: Soft, mild generalized tenderness, nondistended, bowel sounds present  Musculoskeletal: No bilateral pedal edema noted  Skin: Normal  Psychiatry: Normal mood   Data Review: I have personally reviewed the laboratory data and studies available.  Recent Labs  Lab 02/26/19 0345 02/27/19 0419 02/28/19 0502 03/03/19 0449 03/04/19 0320  WBC 9.3 9.9 11.2* 13.3* 13.1*  NEUTROABS 6.3 6.8 7.4 11.5* 10.9*  HGB 12.6 12.7 12.8 11.6* 9.9*  HCT 39.4 38.8 39.8 36.2 30.2*  MCV 102.9* 104.6* 101.8* 102.5* 101.3*  PLT 181 147* 186 177 164   Recent Labs  Lab 02/26/19 0345 02/26/19 2000 02/27/19 0419 02/27/19 0825 02/28/19 0502 03/01/19 0337 03/03/19 0449  NA 143 140 138 142 143 141 144  K 3.2* 3.0* 5.8* 3.5 3.6 4.8 4.0  CL 104 96* 97* 101 103 107 111  CO2 30 31 32 33* 31 25 24   GLUCOSE 146* 126* 488* 138* 91 133* 157*   BUN 26* 23 24* 24* 28* 35* 37*  CREATININE 1.03* 0.88 1.01* 0.93 1.00 0.95 0.97  CALCIUM 8.3* 8.2* 8.4* 8.3* 8.4* 8.4* 8.2*  MG 1.9  --   --   --  1.8  --  1.9  PHOS 2.4* 3.2 4.3 3.2 3.0  --  3.2    Alma Friendly, MD  [facility] 03/04/2019

## 2019-03-04 NOTE — Progress Notes (Signed)
Tina Latino, MD notified of patients D-Dimer result of 6.41.

## 2019-03-04 NOTE — Progress Notes (Signed)
Modified Barium Swallow Progress Note  Patient Details  Name: CAROLLYN ETCHEVERRY MRN: [id number] Date of Birth: 1919/01/10  Today's Date: 03/04/2019  Modified Barium Swallow completed.  Full report located under Chart Review in the Imaging Section.  Brief recommendations include the following:  Clinical Impression  Pt demonstrates a mild oropharygneal dyspahgia with mild silent aspiration events. Pt noted to have palatal torus and calicification on bilateral arytenoids prior to barium being given.  Oral phase characterized by slightly prolonged oral phase and mild base of tongue and vallecular residue. Deficits are likely a combination of missing dentition, dry oral mucosa and baseline behavior given torus. Pt does not appear to have significant weakness. Timing of careflly administered single sips with total asisst were adequate, but when pt consumed self admistered straw or larger consecutive cup sips silent aspiration of thin liquids occured before/during the swallow. A chin tuck increased aspiration. Nectar thick liquids were consumed without any penetration or aspiration and though residue was slightly more significant, pt elicited a second swallow to clear. Given silent nature of aspiration and pts overall frailty, recommend a mechnical soft diet with nectar thick liquids as pt recovers respiratory endurance. Will follow for tolerance.    Swallow Evaluation Recommendations       SLP Diet Recommendations: Dysphagia 3 (Mech soft) solids;Nectar thick liquid   Liquid Administration via: Cup;Straw   Medication Administration: Whole meds with liquid   Supervision: Patient able to self feed   Compensations: Slow rate;Small sips/bites;Follow solids with liquid;Multiple dry swallows after each bite/sip   Postural Changes: Seated upright at 90 degrees          Herbie Baltimore, MA CCC-SLP  Acute Rehabilitation Services Pager [phone] Office [phone]   Jatavius Ellenwood, Katherene Ponto 03/04/2019,3:17 PM

## 2019-03-04 NOTE — Progress Notes (Signed)
Bladder scanned pt, she had 543mL of urine. Pt was able to urinate out over 200 mL out in her BSC. Bladder scan pt again, currently 305. RN will I/O cath pt.

## 2019-03-04 NOTE — Progress Notes (Signed)
Physical Therapy Treatment Patient Details Name: Tina Weaver MRN: [id number] DOB: Jun 10, 1919 Today's Date: 03/04/2019    History of Present Illness Pt is a 85 y/o female admitted secondary to increased abdominal pain. Found to have SBO and had NG tube placed; now s/p laparoscopy and lysis of adhesions on 8/3; . PMH includes HTN, cardiomyopathy, d HF, and aortic stenosis.     PT Comments    Patient ambulating short distances into hallway and back with contact guard, did become tachypneic quickly.  SpO2 98, HR 96. Will cont to follow.   Follow Up Recommendations  Home health PT;Supervision/Assistance - 24 hour     Equipment Recommendations  Rolling walker with 5" wheels(youth sized RW)    Recommendations for Other Services       Precautions / Restrictions Precautions Precautions: Fall Precaution Comments: watch RR Restrictions Weight Bearing Restrictions: No    Mobility  Bed Mobility Overal bed mobility: Modified Independent                Transfers Overall transfer level: Needs assistance Equipment used: Rolling walker (2 wheeled) Transfers: Sit to/from Stand Sit to Stand: Min assist         General transfer comment: min A to stand EOB with RW. unable to do so on her own from neutral height  Ambulation/Gait Ambulation/Gait assistance: Min guard Gait Distance (Feet): 40 Feet Assistive device: Rolling walker (2 wheeled) Gait Pattern/deviations: Step-through pattern;Decreased stride length;Trunk flexed Gait velocity: decreased   General Gait Details: no LOB, VSS, did decome tachypenic. SpO2 98, HR 96 on RA   Stairs             Wheelchair Mobility    Modified Rankin (Stroke Patients Only)       Balance Overall balance assessment: Needs assistance Sitting-balance support: No upper extremity supported;Feet supported Sitting balance-Leahy Scale: Good     Standing balance support: Bilateral upper extremity supported;During functional  activity Standing balance-Leahy Scale: Poor Standing balance comment: Reliant on BUE support                             Cognition Arousal/Alertness: Awake/alert Behavior During Therapy: WFL for tasks assessed/performed;Flat affect Overall Cognitive Status: Difficult to assess                                 General Comments: overall appears appropriate and following commands, but pt not very talkative and soft spoken, so difficult to fully assess       Exercises      General Comments        Pertinent Vitals/Pain Pain Assessment: Faces Faces Pain Scale: Hurts a little bit Pain Location: stomach Pain Descriptors / Indicators: Aching    Home Living                      Prior Function            PT Goals (current goals can now be found in the care plan section) Acute Rehab PT Goals Patient Stated Goal: to go home PT Goal Formulation: With patient Time For Goal Achievement: 03/09/19 Potential to Achieve Goals: Good Progress towards PT goals: Progressing toward goals    Frequency    Min 3X/week      PT Plan Current plan remains appropriate    Co-evaluation  AM-PAC PT "6 Clicks" Mobility   Outcome Measure  Help needed turning from your back to your side while in a flat bed without using bedrails?: A Little Help needed moving from lying on your back to sitting on the side of a flat bed without using bedrails?: A Little Help needed moving to and from a bed to a chair (including a wheelchair)?: A Little Help needed standing up from a chair using your arms (e.g., wheelchair or bedside chair)?: A Lot Help needed to walk in hospital room?: A Little Help needed climbing 3-5 steps with a railing? : A Lot 6 Click Score: 16    End of Session Equipment Utilized During Treatment: Gait belt Activity Tolerance: Patient tolerated treatment well Patient left: in chair;with call bell/phone within reach Nurse  Communication: Mobility status PT Visit Diagnosis: Other abnormalities of gait and mobility (R26.89);Muscle weakness (generalized) (M62.81)     Time: 3435-6861 PT Time Calculation (min) (ACUTE ONLY): 16 min  Charges:  $Gait Training: 8-22 mins                    Reinaldo Berber, PT, DPT Acute Rehabilitation Services Pager: [phone] Office: [phone]     Reinaldo Berber 03/04/2019, 11:59 AM

## 2019-03-04 NOTE — Progress Notes (Signed)
PHARMACY - ADULT TOTAL PARENTERAL NUTRITION CONSULT NOTE   Pharmacy Consult for TPN Indication: bowel obstruction  Patient Measurements: Height: 5' (152.4 cm) Weight: 97 lb 10.6 oz (44.3 kg) IBW/kg (Calculated) : 45.5 TPN AdjBW (KG): 50.8 Body mass index is 19.07 kg/m.  Assessment:  86 year old female admitted 7/26 with increased abdominal pain and nausea for 3 days and found to have high-grade SBO with NG tube placed. Prior to admission, patient lived alone and completed all activities of daily living independently. NGT output increased on 7/30. Patient has been NPO for 6 days here and reports poor PO intake for 3 days prior to admission due to symptoms. Pharmacy consulted for TPN.    GI: High grade SBO - surgery 8/3 with lysis of adhesion. Abdominal pain resolving. LBM 8/6. Pre-albumin 11.3>>10.4. NGT d/c'd. Tolerated CLD 8/6 >> FLD Endo: No history of DM. CBGs controlled Insulin requirements in the past 24 hours: 5 units On PTA synthroid Lytes: All WNL - no new labs 8/7 Renal: SCr stable 0.97, BUN up to 37. UOP 1.1 mL/kg/hr Pulm: RA  Cards: BP elevated - PRN hydralazine. HR controlled Hepatobil: LFTs / Tbili / TG WNL Neuro: morphine PRN ID: Unasyn (8/6>>) for aspiration PNA. Afebrile, WBC 13.1 stable  TPN Access: PICC placement 7/31 TPN start date: 7/31 Nutritional Goals (per RD recommendation on 8/5): KCal: 1250-1450 Protein: 60-75 g Fluid: >/= 1.3L  Goal TPN rate is ~60 ml/hr (provides 71g protein, 190g dextrose, and 47g of lipids and 1397 kcals, meeting 100% of patient's needs)   Current Nutrition:  TPN Advancing CLD to FLD  Plan:  Per discussion with Dr. Rosendo Gros, wean off TPN today Discussed with RN - at 1600 today, wean TPN to 30 ml/hr (1/2 rate) x 2 hrs, then d/c Change SSI to qAC D/c TPN labs/orders  Elicia Lamp, PharmD, BCPS Please check AMION for all [place] contact numbers Clinical Pharmacist 03/04/2019 9:03 AM

## 2019-03-04 NOTE — Progress Notes (Signed)
4 Days Post-Op  Subjective: CC: Doing well. Tolerated CLD. No N/V. Abdominal pain practically resolved, some mild pain around incisions. Passing flatus. Soft BM yesterday morning and yesterday night.   Objective: Vital signs in last 24 hours: Temp:  [97.8 F (36.6 C)-98.2 F (36.8 C)] 97.8 F (36.6 C) (08/07 0443) Pulse Rate:  [74-92] 74 (08/07 0443) Resp:  [16-52] 16 (08/07 0443) BP: (139-184)/(50-79) 155/57 (08/07 0443) SpO2:  [98 %-99 %] 99 % (08/07 0443) FiO2 (%):  [28 %] 28 % (08/06 1046) Weight:  [44.3 kg] 44.3 kg (08/06 2218) Last BM Date: 03/03/19  Intake/Output from previous day: 08/06 0701 - 08/07 0700 In: 1348.4 [P.O.:240; I.V.:908.4; IV Piggyback:200] Out: 1120 [Urine:1120] Intake/Output this shift: No intake/output data recorded.  PE: Gen:  Alert, pleasant,  Card:  Regular Pulm:  Distant breath sounds upper lung fields. Clear at bases. Normal rate and effort Abd: Soft, minimal distended. Appropriately tender around incions without r/r/g. +BS. Ext:  No edema  Lab Results:  Recent Labs    03/03/19 0449 03/04/19 0320  WBC 13.3* 13.1*  HGB 11.6* 9.9*  HCT 36.2 30.2*  PLT 177 164   BMET Recent Labs    03/03/19 0449  NA 144  K 4.0  CL 111  CO2 24  GLUCOSE 157*  BUN 37*  CREATININE 0.97  CALCIUM 8.2*   PT/INR No results for input(s): LABPROT, INR in the last 72 hours. CMP     Component Value Date/Time   NA 144 03/03/2019 0449   K 4.0 03/03/2019 0449   CL 111 03/03/2019 0449   CO2 24 03/03/2019 0449   GLUCOSE 157 (H) 03/03/2019 0449   BUN 37 (H) 03/03/2019 0449   CREATININE 0.97 03/03/2019 0449   CREATININE 1.00 (H) 05/02/2016 0830   CALCIUM 8.2 (L) 03/03/2019 0449   PROT 5.7 (L) 03/03/2019 0449   ALBUMIN 2.1 (L) 03/03/2019 0449   AST 34 03/03/2019 0449   ALT 20 03/03/2019 0449   ALKPHOS 78 03/03/2019 0449   BILITOT 0.4 03/03/2019 0449   GFRNONAA 49 (L) 03/03/2019 0449   GFRAA 56 (L) 03/03/2019 0449   Lipase     Component  Value Date/Time   LIPASE 34 02/20/2019 2253       Studies/Results: Dg Chest Port 1 View  Result Date: 03/03/2019 CLINICAL DATA:  Leukocytosis and short of breath EXAM: PORTABLE CHEST 1 VIEW COMPARISON:  05/15/2015 FINDINGS: NG tube in the stomach. Left arm PICC tip in the SVC at the cavoatrial junction. Elevated right hemidiaphragm with decreased lung volume compared to the prior study. Negative for edema or effusion. Possible right upper lobe airspace disease.  Follow-up recommended. IMPRESSION: Possible right upper lobe infiltrate. Follow-up chest x-ray recommended. Electronically Signed   By: Franchot Gallo M.D.   On: 03/03/2019 08:39    Anti-infectives: Anti-infectives (From admission, onward)   Start     Dose/Rate Route Frequency Ordered Stop   03/03/19 1200  ampicillin-sulbactam (UNASYN) 1.5 g in sodium chloride 0.9 % 100 mL IVPB     1.5 g 200 mL/hr over 30 Minutes Intravenous Every 8 hours 03/03/19 1103     02/28/19 0721  ceFAZolin (ANCEF) 2-4 GM/100ML-% IVPB    Note to Pharmacy: Laurita Quint   : cabinet override      02/28/19 0721 02/28/19 1929   02/27/19 1230  ceFAZolin (ANCEF) IVPB 2g/100 mL premix     2 g 200 mL/hr over 30 Minutes Intravenous  Once 02/27/19 1158 02/27/19 1456  Assessment/Plan Hypernatremia/hyperchloremia/hypokalemia - resolved Hx hypertension Hx chronic diastolic congestive heart failure Hx stage III kidney disease Hypothyroid Urinary retention- Getting I/O cath currently  SBO due to adhesions Diagnostic laparoscopy with lysis of adhesions 02/28/2019 Dr. Ralene Ok. - POD 4 - WBC up to 13 (from 11). CXR appears to have RUL infiltrate which is likely the source.  - FLD. AAT to soft diet. When tolerating a soft diet she is clear for d/c from general surgery standpoint.  - Mobilize with PT/OT  FEN: FLD, AAT to soft. TPN ID: Unasyn (PNA - per primary) DVT: SCDs, Lovenox Follow-up: Dr. Rosendo Gros POC: Tina Weaver, Tina Weaver Son [phone]     Tina Weaver, Tina Weaver [phone]  [phone]  Tina Weaver, Tina Weaver Other   [phone]     LOS: 11 days    Tina Weaver , [facility] Surgery 03/04/2019, 8:49 AM Pager: [phone]

## 2019-03-04 NOTE — Evaluation (Signed)
Clinical/Bedside Swallow Evaluation Patient Details  Name: Tina Weaver MRN: [id number] Date of Birth: 1919/04/06  Today's Date: 03/04/2019 Time: SLP Start Time (ACUTE ONLY): 0950 SLP Stop Time (ACUTE ONLY): 1012 SLP Time Calculation (min) (ACUTE ONLY): 22 min  Past Medical History:  Past Medical History:  Diagnosis Date  . CHF (congestive heart failure) ([place])   . DOE (dyspnea on exertion) 09/11/2015  . Hypertension    Past Surgical History:  Past Surgical History:  Procedure Laterality Date  . ABDOMINAL HYSTERECTOMY    . APPENDECTOMY    . LAPAROSCOPY N/A 02/28/2019   Procedure: LAPAROSCOPY DIAGNOSTIC;  Surgeon: Ralene Ok, MD;  Location: [place];  Service: General;  Laterality: N/A;  . LYSIS OF ADHESION N/A 02/28/2019   Procedure: LYSIS OF ADHESION;  Surgeon: Ralene Ok, MD;  Location: [place];  Service: General;  Laterality: N/A;   HPI:  13 M Bookeris a 85 y.o.femalewith medical history significant ofHTN, chronic diastolic CHF with hypertensive cardiomyopathy, AS. Patient presentedto the ED on 7/27with c/o abd pain for 3 days associated with nausea, generalized weakness. CT of the abdomen and pelvis demonstrated a high grade SBO. Patient ultimately underwent diagnostic laparoscopy with lysis of adhesions on 8/3.  Noted to have sudden tachypnea on 8/6, although saturating well on room air Afebrile, with leukocytosis. MD concerned about aspiration. CXR shows possible RUL infiltrate.    Assessment / Plan / Recommendation Clinical Impression  Pt demonstrates signs concerning for possible aspiration. Pt has very dry lingual mucosa, slightly improved with oral care. She is able to follow most commands, but when offered a straw, sips continuously despite verbal instruction to slow rate. Timing of breathing and swallow suspected to be impaired. RR increases visibly and pt has almost a suppressed cough. Recommend instrumental assessment to investigate further, son in agreement. WIll  also thicken liquids to nectar and advance diet to purees in the meantime.  SLP Visit Diagnosis: Dysphagia, oropharyngeal phase (R13.12)    Aspiration Risk  Moderate aspiration risk    Diet Recommendation Dysphagia 1 (Puree);Nectar-thick liquid   Liquid Administration via: Cup;Straw Medication Administration: Whole meds with puree Supervision: Staff to assist with self feeding Compensations: Slow rate;Small sips/bites Postural Changes: Seated upright at 90 degrees    Other  Recommendations Oral Care Recommendations: Oral care BID Other Recommendations: Order thickener from pharmacy;Have oral suction available   Follow up Recommendations        Frequency and Duration            Prognosis        Swallow Study   General HPI: 79 M Bookeris a 85 y.o.femalewith medical history significant ofHTN, chronic diastolic CHF with hypertensive cardiomyopathy, AS. Patient presentedto the ED on 7/27with c/o abd pain for 3 days associated with nausea, generalized weakness. CT of the abdomen and pelvis demonstrated a high grade SBO. Patient ultimately underwent diagnostic laparoscopy with lysis of adhesions on 8/3.  Noted to have sudden tachypnea on 8/6, although saturating well on room air Afebrile, with leukocytosis. MD concerned about aspiration. CXR shows possible RUL infiltrate.  Type of Study: Bedside Swallow Evaluation Previous Swallow Assessment: 2016 - WNL Diet Prior to this Study: Thin liquids Temperature Spikes Noted: No Respiratory Status: Nasal cannula History of Recent Intubation: No Behavior/Cognition: Alert;Cooperative;Pleasant mood Oral Cavity Assessment: Dry Oral Care Completed by SLP: No Oral Cavity - Dentition: Dentures, top;Dentures, bottom Self-Feeding Abilities: Total assist Patient Positioning: Upright in bed Baseline Vocal Quality: Low vocal intensity Volitional Cough: Weak    Oral/Motor/Sensory  Function Overall Oral Motor/Sensory Function: Generalized oral  weakness(lingual - ROM improved after oral care)   Ice Chips     Thin Liquid Thin Liquid: Impaired Presentation: Cup;Straw Pharyngeal  Phase Impairments: Multiple swallows;Suspected delayed Swallow;Change in Vital Signs(in RR, no cough)    Nectar Thick Nectar Thick Liquid: Not tested   Honey Thick Honey Thick Liquid: Not tested   Puree Puree: Within functional limits Presentation: Spoon   Solid     Solid: Impaired Oral Phase Functional Implications: Prolonged oral transit;Oral residue     Herbie Baltimore, MA [place] Pager [phone] Office [phone]  Lynann Beaver 03/04/2019,10:27 AM

## 2019-03-05 ENCOUNTER — Inpatient Hospital Stay (HOSPITAL_COMMUNITY): Payer: Medicare HMO

## 2019-03-05 LAB — BASIC METABOLIC PANEL
Anion gap: 9 (ref 5–15)
BUN: 38 mg/dL — ABNORMAL HIGH (ref 8–23)
CO2: 22 mmol/L (ref 22–32)
Calcium: 7.8 mg/dL — ABNORMAL LOW (ref 8.9–10.3)
Chloride: 110 mmol/L (ref 98–111)
Creatinine, Ser: 1.06 mg/dL — ABNORMAL HIGH (ref 0.44–1.00)
GFR calc Af Amer: 51 mL/min — ABNORMAL LOW (ref >60)
GFR calc non Af Amer: 44 mL/min — ABNORMAL LOW (ref >60)
Glucose, Bld: 128 mg/dL — ABNORMAL HIGH (ref 70–99)
Potassium: 3.7 mmol/L (ref 3.5–5.1)
Sodium: 141 mmol/L (ref 135–145)

## 2019-03-05 LAB — CBC WITH DIFFERENTIAL/PLATELET
Abs Immature Granulocytes: 0.13 10*3/uL — ABNORMAL HIGH (ref 0.00–0.07)
Basophils Absolute: 0 10*3/uL (ref 0.0–0.1)
Basophils Relative: 0 %
Eosinophils Absolute: 0.3 10*3/uL (ref 0.0–0.5)
Eosinophils Relative: 3 %
HCT: 32 % — ABNORMAL LOW (ref 36.0–46.0)
Hemoglobin: 10.2 g/dL — ABNORMAL LOW (ref 12.0–15.0)
Immature Granulocytes: 1 %
Lymphocytes Relative: 8 %
Lymphs Abs: 0.8 10*3/uL (ref 0.7–4.0)
MCH: 32.7 pg (ref 26.0–34.0)
MCHC: 31.9 g/dL (ref 30.0–36.0)
MCV: 102.6 fL — ABNORMAL HIGH (ref 80.0–100.0)
Monocytes Absolute: 0.8 10*3/uL (ref 0.1–1.0)
Monocytes Relative: 7 %
Neutro Abs: 8.8 10*3/uL — ABNORMAL HIGH (ref 1.7–7.7)
Neutrophils Relative %: 81 %
Platelets: 197 10*3/uL (ref 150–400)
RBC: 3.12 MIL/uL — ABNORMAL LOW (ref 3.87–5.11)
RDW: 14.6 % (ref 11.5–15.5)
WBC: 10.9 10*3/uL — ABNORMAL HIGH (ref 4.0–10.5)
nRBC: 0 % (ref 0.0–0.2)

## 2019-03-05 LAB — GLUCOSE, CAPILLARY
Glucose-Capillary: 112 mg/dL — ABNORMAL HIGH (ref 70–99)
Glucose-Capillary: 120 mg/dL — ABNORMAL HIGH (ref 70–99)
Glucose-Capillary: 121 mg/dL — ABNORMAL HIGH (ref 70–99)
Glucose-Capillary: 110 mg/dL — ABNORMAL HIGH (ref 70–99)

## 2019-03-05 MED ORDER — FUROSEMIDE 20 MG PO TABS
20.0000 mg | ORAL_TABLET | Freq: Every day | ORAL | Status: DC
  Administered 2019-03-06 – 2019-03-09 (×4): 20 mg via ORAL
  Filled 2019-03-05 (×4): qty 1

## 2019-03-05 MED ORDER — FUROSEMIDE 20 MG PO TABS: 20.0000 mg | ORAL_TABLET | Freq: Every day | ORAL | Status: DC

## 2019-03-05 MED ORDER — FUROSEMIDE 10 MG/ML IJ SOLN
20.0000 mg | Freq: Once | INTRAMUSCULAR | Status: AC
  Administered 2019-03-05: 20 mg via INTRAVENOUS
  Filled 2019-03-05: qty 2

## 2019-03-05 NOTE — Progress Notes (Signed)
Patient ID: Tina Weaver, female   DOB: Jan 29, 1919, 85 y.o.   MRN: [id number]    5 Days Post-Op  Subjective: Patient sitting up in her chair.  Ate breakfast this morning she says and passing flatus.    Objective: Vital signs in last 24 hours: Temp:  [98.2 F (36.8 C)-99.3 F (37.4 C)] 98.7 F (37.1 C) (08/08 0849) Pulse Rate:  [87-95] 91 (08/08 0849) Resp:  [14-46] 46 (08/08 0849) BP: (147-172)/(54-72) 153/68 (08/08 0849) SpO2:  [95 %-96 %] 96 % (08/08 0849) Weight:  [44.1 kg] 44.1 kg (08/07 2020) Last BM Date: 03/04/19  Intake/Output from previous day: 08/07 0701 - 08/08 0700 In: 2256.5 [P.O.:780; I.V.:1076.5; IV Piggyback:400] Out: 800 [Urine:800] Intake/Output this shift: Total I/O In: 240 [P.O.:240] Out: -   PE: Heart: regular Lungs: CTAB, RR does not seem to be as high as documented in mid 40s.  Patient is comfortable and not working to breath Abd: soft, some distention, but +BS, minimal tenderness around incisions as expected  Lab Results:  Recent Labs    03/04/19 0320 03/05/19 0420  WBC 13.1* 10.9*  HGB 9.9* 10.2*  HCT 30.2* 32.0*  PLT 164 197   BMET Recent Labs    03/03/19 0449 03/05/19 0420  NA 144 141  K 4.0 3.7  CL 111 110  CO2 24 22  GLUCOSE 157* 128*  BUN 37* 38*  CREATININE 0.97 1.06*  CALCIUM 8.2* 7.8*   PT/INR No results for input(s): LABPROT, INR in the last 72 hours. CMP     Component Value Date/Time   NA 141 03/05/2019 0420   K 3.7 03/05/2019 0420   CL 110 03/05/2019 0420   CO2 22 03/05/2019 0420   GLUCOSE 128 (H) 03/05/2019 0420   BUN 38 (H) 03/05/2019 0420   CREATININE 1.06 (H) 03/05/2019 0420   CREATININE 1.00 (H) 05/02/2016 0830   CALCIUM 7.8 (L) 03/05/2019 0420   PROT 5.7 (L) 03/03/2019 0449   ALBUMIN 2.1 (L) 03/03/2019 0449   AST 34 03/03/2019 0449   ALT 20 03/03/2019 0449   ALKPHOS 78 03/03/2019 0449   BILITOT 0.4 03/03/2019 0449   GFRNONAA 44 (L) 03/05/2019 0420   GFRAA 51 (L) 03/05/2019 0420   Lipase       Component Value Date/Time   LIPASE 34 02/20/2019 2253       Studies/Results: Ct Angio Chest Pe W Or Wo Contrast  Result Date: 03/04/2019 CLINICAL DATA:  Intermediate probability for pulmonary embolus. PE suspected. Shortness of breath. Positive D-dimer. EXAM: CT ANGIOGRAPHY CHEST WITH CONTRAST TECHNIQUE: Multidetector CT imaging of the chest was performed using the standard protocol during bolus administration of intravenous contrast. Multiplanar CT image reconstructions and MIPs were obtained to evaluate the vascular anatomy. CONTRAST:  33mL OMNIPAQUE IOHEXOL 350 MG/ML SOLN COMPARISON:  Chest x-ray March 03, 2019 FINDINGS: Cardiovascular: Cardiomegaly is identified. Coronary artery calcifications are noted. Atherosclerotic change is seen throughout the thoracic aorta. No dissection or aneurysm is identified. Evaluation of peripheral pulmonary arteries in the bases is limited due to respiratory motion. Within this limitation, no pulmonary emboli identified. Mediastinum/Nodes: There appears to be a small amount of fluid along the length of the esophagus. The esophagus is otherwise normal. No adenopathy. No effusions. Lungs/Pleura: Central airways are normal. No pneumothorax. Platelike opacity in the right base is consistent with atelectasis. No suspicious infiltrates. A calcified granuloma is seen in the superior segment of the left lower lobe. No suspicious nodules or masses are seen in either lung. Upper  Abdomen: No acute abnormality. Musculoskeletal: A compression fracture of a midthoracic vertebral body is unchanged compared to the chest x-ray from May 15, 2015. Review of the MIP images confirms the above findings. IMPRESSION: 1. No pulmonary emboli. 2. Cardiomegaly. 3. Coronary artery calcifications. 4. Atherosclerotic change in the thoracic aorta without aneurysm. 5. There appears to be a small amount of fluid in the esophagus, possibly due to reflux. 6. Atelectasis in the right base. 7. Chronic  compression fracture of a midthoracic vertebral body. Aortic Atherosclerosis (ICD10-I70.0). Electronically Signed   By: Dorise Bullion III M.D   On: 03/04/2019 20:24    Anti-infectives: Anti-infectives (From admission, onward)   Start     Dose/Rate Route Frequency Ordered Stop   03/03/19 1200  ampicillin-sulbactam (UNASYN) 1.5 g in sodium chloride 0.9 % 100 mL IVPB     1.5 g 200 mL/hr over 30 Minutes Intravenous Every 8 hours 03/03/19 1103     02/28/19 0721  ceFAZolin (ANCEF) 2-4 GM/100ML-% IVPB    Note to Pharmacy: Laurita Quint   : cabinet override      02/28/19 0721 02/28/19 1929   02/27/19 1230  ceFAZolin (ANCEF) IVPB 2g/100 mL premix     2 g 200 mL/hr over 30 Minutes Intravenous  Once 02/27/19 1158 02/27/19 1456       Assessment/Plan Hypernatremia/hyperchloremia/hypokalemia- resolved Hx hypertension Hx chronic diastolic congestive heart failure Hx stage III kidney disease Hypothyroid Urinary retention-appears foley catheter had to be replaced  SBO due to adhesions Diagnostic laparoscopy with lysis of adhesions 02/28/2019 Dr. Ralene Ok.- POD 5 - WBC 10 from 13.  PNA suspected to be original source, per medicine - D3 diet and seems to be tolerating this.  - Mobilize with PT/OT -surgically stable  FEN:D3 diet ID: Unasyn (PNA - per primary) DVT: SCDs, Lovenox Follow-up: Dr. Rosendo Gros   LOS: 12 days    Henreitta Cea , [facility] Surgery 03/05/2019, 11:55 AM Pager: [phone]

## 2019-03-05 NOTE — Progress Notes (Signed)
Occupational Therapy Treatment Patient Details Name: SHELLA LAHMAN MRN: [id number] DOB: 13-Apr-1919 Today's Date: 03/05/2019    History of present illness Pt is a 85 y/o female admitted secondary to increased abdominal pain. Found to have SBO and had NG tube placed; now s/p laparoscopy and lysis of adhesions on 8/3; . PMH includes HTN, cardiomyopathy, d HF, and aortic stenosis.    OT comments  Pt. Seen for skilled OT treatment session.  Able to complete lb dressing task and toileting min a.  Cues for rw management.  Overall moving well.    Follow Up Recommendations  SNF;Supervision/Assistance - 24 hour;Home health OT    Equipment Recommendations       Recommendations for Other Services      Precautions / Restrictions Precautions Precautions: Fall       Mobility Bed Mobility               General bed mobility comments: seated in recliner beginning and end of session  Transfers Overall transfer level: Needs assistance Equipment used: Rolling walker (2 wheeled) Transfers: Sit to/from [facility] Sit to Stand: Min guard Stand pivot transfers: Min guard            Balance                                           ADL either performed or assessed with clinical judgement   ADL Overall ADL's : Needs assistance/impaired                     Lower Body Dressing: Supervision/safety;Sitting/lateral leans Lower Body Dressing Details (indicate cue type and reason): don/doff B socks Toilet Transfer: Warden/ranger;Ambulation   Toileting- Clothing Manipulation and Hygiene: Minimal assistance;Sit to/from stand         General ADL Comments: did well with ambulation to/from b.room, cues for RW management     Vision       Perception     Praxis      Cognition Arousal/Alertness: Awake/alert Behavior During Therapy: WFL for tasks assessed/performed Overall Cognitive Status: Difficult to assess                                  General Comments: pt. answering questions appropriately talking about her daily exercise and foods she likes to cook        Exercises     Shoulder Instructions       General Comments      Pertinent Vitals/ Pain       Pain Assessment: No/denies pain  Home Living                                          Prior Functioning/Environment              Frequency  Min 2X/week        Progress Toward Goals  OT Goals(current goals can now be found in the care plan section)  Progress towards OT goals: Progressing toward goals     Plan Discharge plan remains appropriate    Co-evaluation                 AM-PAC OT "6 Clicks" Daily Activity  Outcome Measure   Help from another person eating meals?: Total Help from another person taking care of personal grooming?: A Little Help from another person toileting, which includes using toliet, bedpan, or urinal?: A Little Help from another person bathing (including washing, rinsing, drying)?: A Little Help from another person to put on and taking off regular upper body clothing?: A Little Help from another person to put on and taking off regular lower body clothing?: A Little 6 Click Score: 16    End of Session Equipment Utilized During Treatment: Gait belt;Rolling walker  OT Visit Diagnosis: Muscle weakness (generalized) (M62.81);Other abnormalities of gait and mobility (R26.89)   Activity Tolerance Patient tolerated treatment well   Patient Left in chair;with call bell/phone within reach   Nurse Communication          Time: 2536-6440 OT Time Calculation (min): 13 min  Charges: OT General Charges $OT Visit: 1 Visit OT Treatments $Self Care/Home Management : 8-22 mins  Janice Coffin, COTA/L 03/05/2019, 12:28 PM

## 2019-03-05 NOTE — Progress Notes (Signed)
PROGRESS NOTE  Tina Weaver WPV:[id number] DOB: 28-Oct-1918 DOA: 02/20/2019 PCP: Lajean Manes, MD   LOS: 12 days   Brief narrative: Tina Mcfarland Bookeris a 85 y.o.femalewith medical history significant ofHTN, chronic diastolic CHF with hypertensive cardiomyopathy, AS. Patient presentedto the ED on 7/27with c/o abd pain for 3 days associated with nausea, generalized weakness. CT of the abdomen and pelvis demonstrated a high grade SBO. Patient was admitted for small bowel obstruction. General surgery was consulted andpatient was started on conservative management with IV fluid, bowel rest, pain medicine and NG tube suction. Repeat abdominal x-ray 7/30 slight interval increase in the size of the small bowel loops. Despite multiple days of conservative methods,patient did not clinically or radiographically improve. General surgery had multiple discussions with patient and her son/POA. Patient ultimately underwent diagnostic laparoscopy with lysis of adhesions on 8/3.  Subjective: Patient noted to be intermittently tachypneic, although sats normal on room air.  Other vital signs stable.  Patient denies any worsening abdominal pain, denies any chest pain, shortness of breath, fever/chills.   Assessment/Plan:  Principal Problem:   SBO (small bowel obstruction) (HCC) Active Problems:   HTN (hypertension)   CKD (chronic kidney disease), stage III (HCC)   Chronic diastolic heart failure (HCC)   Malnutrition of moderate degree  High-grade small bowel obstruction status post laparoscopic adhesion lysis on 8/3 by general surgery CT scan finding as above. Failed conservative management. Had postoperative ileus, currently resolved S/P TPN, advanced to dysphagia diet, nectar thick General surgery on board  ??Possible aspiration pneumonia/HCAP Noted to have sudden tachypnea, although saturating well on room air Afebrile, with resolving leukocytosis ABG with pH of 7.54, CO2 26, PO2 94.6, bicarb  22.4 Chest x-ray with right upper lobe infiltrate SLP consulted Nebulizers, supplemental oxygen PRN Continue IV Unasyn Monitor closely  Elevated d-dimer Could be due to recent surgery, currently tachypneic, although not hypoxic or tachycardic Rule out PE, CTA chest unremarkable Monitor closely  Dysphagia SLP on board Change diet to dysphagia 3, nectar thick liquid Aspiration precautions  Cardiovascular issues:HTN,HLD, chronic diastolic CHF,moderate aortic stenosis, coronary artery disease, HOCM, CKD stage III ??  Acute on chronic diastolic HF Prior to admission, patient was on Coreg 18.75 mg twice daily, Lasix 20 mg dailyandCrestor 5 mg daily. Restart Lasix Cardiology consult appreciated.  Hypothyroidism Continue Synthroid   Mobility:Encourage ambulation.  Was able to ambulate prior to hospitalization. Diet:Dysphagia diet, thick nectar liquid DVT prophylaxis:Lovenox Code Status:Code Status: DNR Family Communication:None at bedside Expected Discharge:To be determined, likely home with home health  Consultants:  General surgery  Procedures:  Laparoscopic adhesional lysis on 8/3  Antimicrobials:  Anti-infectives (From admission, onward)   Start     Dose/Rate Route Frequency Ordered Stop   03/03/19 1200  ampicillin-sulbactam (UNASYN) 1.5 g in sodium chloride 0.9 % 100 mL IVPB     1.5 g 200 mL/hr over 30 Minutes Intravenous Every 8 hours 03/03/19 1103     02/28/19 0721  ceFAZolin (ANCEF) 2-4 GM/100ML-% IVPB    Note to Pharmacy: Laurita Quint   : cabinet override      02/28/19 0721 02/28/19 1929   02/27/19 1230  ceFAZolin (ANCEF) IVPB 2g/100 mL premix     2 g 200 mL/hr over 30 Minutes Intravenous  Once 02/27/19 1158 02/27/19 1456      Infusions:  . sodium chloride 250 mL (02/23/19 0955)  . ampicillin-sulbactam (UNASYN) IV 1.5 g (03/05/19 0501)    Scheduled Meds: . enoxaparin (LOVENOX) injection  30 mg Subcutaneous Q24H  .  furosemide  20  mg Intravenous Once  . [START ON 03/06/2019] furosemide  20 mg Oral Daily  . insulin aspart  0-9 Units Subcutaneous TID WC  . levothyroxine  88 mcg Oral Daily  . mouth rinse  15 mL Mouth Rinse BID  . sodium chloride flush  3 mL Intravenous Once    PRN meds: sodium chloride, acetaminophen **OR** acetaminophen, fentaNYL (SUBLIMAZE) injection, hydrALAZINE, ipratropium-albuterol, menthol-cetylpyridinium, metoprolol tartrate, morphine injection, ondansetron **OR** ondansetron (ZOFRAN) IV, phenol, Resource ThickenUp Clear, sodium chloride flush   Objective: Vitals:   03/05/19 0630 03/05/19 0849  BP: (!) 172/72 (!) 153/68  Pulse: 88 91  Resp: 20 (!) 46  Temp: 99.3 F (37.4 C) 98.7 F (37.1 C)  SpO2: 95% 96%    Intake/Output Summary (Last 24 hours) at 03/05/2019 1253 Last data filed at 03/05/2019 0857 Gross per 24 hour  Intake 1560.1 ml  Output 800 ml  Net 760.1 ml   Filed Weights   02/25/19 0300 03/03/19 2218 03/04/19 2020  Weight: 45 kg 44.3 kg 44.1 kg   Weight change: -0.2 kg Body mass index is 18.99 kg/m.   Physical Exam:  General: NAD   Cardiovascular: S1, S2 present  Respiratory: CTAB  Abdomen: Soft, mild generalized tenderness, +distended, bowel sounds present  Musculoskeletal: No bilateral pedal edema noted  Skin: Normal  Psychiatry: Normal mood   Data Review: I have personally reviewed the laboratory data and studies available.  Recent Labs  Lab 02/27/19 0419 02/28/19 0502 03/03/19 0449 03/04/19 0320 03/05/19 0420  WBC 9.9 11.2* 13.3* 13.1* 10.9*  NEUTROABS 6.8 7.4 11.5* 10.9* 8.8*  HGB 12.7 12.8 11.6* 9.9* 10.2*  HCT 38.8 39.8 36.2 30.2* 32.0*  MCV 104.6* 101.8* 102.5* 101.3* 102.6*  PLT 147* 186 177 164 197   Recent Labs  Lab 02/26/19 2000 02/27/19 0419 02/27/19 0825 02/28/19 0502 03/01/19 0337 03/03/19 0449 03/05/19 0420  NA 140 138 142 143 141 144 141  K 3.0* 5.8* 3.5 3.6 4.8 4.0 3.7  CL 96* 97* 101 103 107 111 110  CO2 31 32 33* 31 25  24 22   GLUCOSE 126* 488* 138* 91 133* 157* 128*  BUN 23 24* 24* 28* 35* 37* 38*  CREATININE 0.88 1.01* 0.93 1.00 0.95 0.97 1.06*  CALCIUM 8.2* 8.4* 8.3* 8.4* 8.4* 8.2* 7.8*  MG  --   --   --  1.8  --  1.9  --   PHOS 3.2 4.3 3.2 3.0  --  3.2  --     Alma Friendly, MD  [facility] 03/05/2019

## 2019-03-05 NOTE — Progress Notes (Signed)
   03/05/19 9892  Provider Notification  Provider Name/Title Dr. Horris Latino  Date Provider Notified 03/05/19  Time Provider Notified [phone]  Notification Type Page  Notification Reason Other (Comment) (Increased respirations = 46)

## 2019-03-06 LAB — GLUCOSE, CAPILLARY
Glucose-Capillary: 97 mg/dL (ref 70–99)
Glucose-Capillary: 148 mg/dL — ABNORMAL HIGH (ref 70–99)
Glucose-Capillary: 120 mg/dL — ABNORMAL HIGH (ref 70–99)
Glucose-Capillary: 142 mg/dL — ABNORMAL HIGH (ref 70–99)

## 2019-03-06 LAB — CBC WITH DIFFERENTIAL/PLATELET
Abs Immature Granulocytes: 0.06 10*3/uL (ref 0.00–0.07)
Basophils Absolute: 0 10*3/uL (ref 0.0–0.1)
Basophils Relative: 0 %
Eosinophils Absolute: 0.4 10*3/uL (ref 0.0–0.5)
Eosinophils Relative: 3 %
HCT: 31.9 % — ABNORMAL LOW (ref 36.0–46.0)
Hemoglobin: 10.3 g/dL — ABNORMAL LOW (ref 12.0–15.0)
Immature Granulocytes: 1 %
Lymphocytes Relative: 8 %
Lymphs Abs: 0.9 10*3/uL (ref 0.7–4.0)
MCH: 32.7 pg (ref 26.0–34.0)
MCHC: 32.3 g/dL (ref 30.0–36.0)
MCV: 101.3 fL — ABNORMAL HIGH (ref 80.0–100.0)
Monocytes Absolute: 0.8 10*3/uL (ref 0.1–1.0)
Monocytes Relative: 7 %
Neutro Abs: 8.9 10*3/uL — ABNORMAL HIGH (ref 1.7–7.7)
Neutrophils Relative %: 81 %
Platelets: 229 10*3/uL (ref 150–400)
RBC: 3.15 MIL/uL — ABNORMAL LOW (ref 3.87–5.11)
RDW: 14.6 % (ref 11.5–15.5)
WBC: 11.1 10*3/uL — ABNORMAL HIGH (ref 4.0–10.5)
nRBC: 0 % (ref 0.0–0.2)

## 2019-03-06 LAB — BASIC METABOLIC PANEL
Anion gap: 11 (ref 5–15)
BUN: 33 mg/dL — ABNORMAL HIGH (ref 8–23)
CO2: 21 mmol/L — ABNORMAL LOW (ref 22–32)
Calcium: 8 mg/dL — ABNORMAL LOW (ref 8.9–10.3)
Chloride: 108 mmol/L (ref 98–111)
Creatinine, Ser: 1.02 mg/dL — ABNORMAL HIGH (ref 0.44–1.00)
GFR calc Af Amer: 53 mL/min — ABNORMAL LOW (ref >60)
GFR calc non Af Amer: 46 mL/min — ABNORMAL LOW (ref >60)
Glucose, Bld: 111 mg/dL — ABNORMAL HIGH (ref 70–99)
Potassium: 3.5 mmol/L (ref 3.5–5.1)
Sodium: 140 mmol/L (ref 135–145)

## 2019-03-06 MED ORDER — CARVEDILOL 12.5 MG PO TABS
12.5000 mg | ORAL_TABLET | Freq: Two times a day (BID) | ORAL | Status: DC
  Administered 2019-03-06 – 2019-03-07 (×2): 12.5 mg via ORAL
  Filled 2019-03-06 (×2): qty 1

## 2019-03-06 MED ORDER — SODIUM CHLORIDE 0.9 % IV SOLN
1.5000 g | Freq: Two times a day (BID) | INTRAVENOUS | Status: DC
  Administered 2019-03-07 – 2019-03-08 (×3): 1.5 g via INTRAVENOUS
  Filled 2019-03-06 (×2): qty 4
  Filled 2019-03-06: qty 1.5
  Filled 2019-03-06: qty 4

## 2019-03-06 MED ORDER — PANTOPRAZOLE SODIUM 40 MG PO TBEC
40.0000 mg | DELAYED_RELEASE_TABLET | Freq: Every day | ORAL | Status: DC
  Administered 2019-03-06 – 2019-03-11 (×6): 40 mg via ORAL
  Filled 2019-03-06 (×5): qty 1

## 2019-03-06 NOTE — Progress Notes (Signed)
PROGRESS NOTE  Tina Weaver EXN:[id number] DOB: 08-31-1918 DOA: 02/20/2019 PCP: Tina Manes, MD   LOS: 13 days   Brief narrative: Tina Lozon Bookeris a 85 y.o.femalewith medical history significant ofHTN, chronic diastolic CHF with hypertensive cardiomyopathy, AS. Patient presentedto the ED on 7/27with c/o abd pain for 3 days associated with nausea, generalized weakness. CT of the abdomen and pelvis demonstrated a high grade SBO. Patient was admitted for small bowel obstruction. General surgery was consulted andpatient was started on conservative management with IV fluid, bowel rest, pain medicine and NG tube suction. Repeat abdominal x-ray 7/30 slight interval increase in the size of the small bowel loops. Despite multiple days of conservative methods,patient did not clinically or radiographically improve. General surgery had multiple discussions with patient and her son/POA. Patient ultimately underwent diagnostic laparoscopy with lysis of adhesions on 8/3.  Subjective: Patient still noted to be intermittently tachypneic, denies any chest pain, shortness of breath, abdominal pain, fever/chills, dizziness.  Having bowel movements.   Assessment/Plan:  Principal Problem:   SBO (small bowel obstruction) (HCC) Active Problems:   HTN (hypertension)   CKD (chronic kidney disease), stage III (HCC)   Chronic diastolic heart failure (HCC)   Malnutrition of moderate degree  High-grade small bowel obstruction status post laparoscopic adhesion lysis on 8/3 by general surgery CT scan finding as above. Failed conservative management. Had postoperative ileus, currently resolved S/P TPN, advanced to dysphagia diet, nectar thick General surgery on board  ??Possible aspiration pneumonia/HCAP/reflux Noted to have sudden tachypnea, although saturating well on room air Afebrile, with resolving leukocytosis ABG with pH of 7.54, CO2 26, PO2 94.6, bicarb 22.4 Chest x-ray with right upper lobe  infiltrate CTA chest showed no PE, atelectasis in the right base, small amount of fluid in the esophagus likely due to reflux noted SLP consulted Nebulizers, supplemental oxygen PRN Continue IV Unasyn Start p.o. pantoprazole Monitor closely  Elevated d-dimer Could be due to recent surgery, currently tachypneic, although not hypoxic or tachycardic Ruled out PE, CTA chest unremarkable Monitor closely  Dysphagia SLP on board Change diet to dysphagia 3, nectar thick liquid Aspiration precautions  Cardiovascular issues:HTN,HLD, chronic diastolic CHF,moderate aortic stenosis, coronary artery disease, HOCM, CKD stage III ??  Acute on chronic diastolic HF Prior to admission, patient was on Coreg 18.75 mg twice daily, Lasix 20 mg dailyandCrestor 5 mg daily. Restart Lasix Cardiology consult appreciated.  Hypothyroidism Continue Synthroid   Mobility:Encourage ambulation.  Was able to ambulate prior to hospitalization. Diet:Dysphagia diet, thick nectar liquid DVT prophylaxis:Lovenox Code Status:Code Status: DNR Family Communication:None at bedside Expected Discharge:To be determined, likely home with home health  Consultants:  General surgery  Procedures:  Laparoscopic adhesional lysis on 8/3  Antimicrobials:  Anti-infectives (From admission, onward)   Start     Dose/Rate Route Frequency Ordered Stop   03/07/19 0150  ampicillin-sulbactam (UNASYN) 1.5 g in sodium chloride 0.9 % 100 mL IVPB     1.5 g 200 mL/hr over 30 Minutes Intravenous Every 12 hours 03/06/19 1524     03/03/19 1200  ampicillin-sulbactam (UNASYN) 1.5 g in sodium chloride 0.9 % 100 mL IVPB  Status:  Discontinued     1.5 g 200 mL/hr over 30 Minutes Intravenous Every 8 hours 03/03/19 1103 03/06/19 1524   02/28/19 0721  ceFAZolin (ANCEF) 2-4 GM/100ML-% IVPB    Note to Pharmacy: Laurita Quint   : cabinet override      02/28/19 0721 02/28/19 1929   02/27/19 1230  ceFAZolin (ANCEF) IVPB 2g/100  mL premix  2 g 200 mL/hr over 30 Minutes Intravenous  Once 02/27/19 1158 02/27/19 1456      Infusions:  . sodium chloride 250 mL (02/23/19 0955)  . [START ON 03/07/2019] ampicillin-sulbactam (UNASYN) IV      Scheduled Meds: . carvedilol  12.5 mg Oral BID WC  . enoxaparin (LOVENOX) injection  30 mg Subcutaneous Q24H  . furosemide  20 mg Oral Daily  . insulin aspart  0-9 Units Subcutaneous TID WC  . levothyroxine  88 mcg Oral Daily  . mouth rinse  15 mL Mouth Rinse BID  . sodium chloride flush  3 mL Intravenous Once    PRN meds: sodium chloride, acetaminophen **OR** acetaminophen, fentaNYL (SUBLIMAZE) injection, hydrALAZINE, ipratropium-albuterol, menthol-cetylpyridinium, metoprolol tartrate, morphine injection, ondansetron **OR** ondansetron (ZOFRAN) IV, phenol, Resource ThickenUp Clear, sodium chloride flush   Objective: Vitals:   03/06/19 0650 03/06/19 0916  BP: (!) 126/57 (!) 141/57  Pulse: 77 79  Resp: (!) 24 (!) 32  Temp: 98 F (36.7 C) 98 F (36.7 C)  SpO2: 100% 100%    Intake/Output Summary (Last 24 hours) at 03/06/2019 1605 Last data filed at 03/06/2019 0916 Gross per 24 hour  Intake 660 ml  Output 975 ml  Net -315 ml   Filed Weights   02/25/19 0300 03/03/19 2218 03/04/19 2020  Weight: 45 kg 44.3 kg 44.1 kg   Weight change:  Body mass index is 18.99 kg/m.   Physical Exam:  General: NAD   Cardiovascular: S1, S2 present  Respiratory: CTAB  Abdomen: Soft, nontender, +distended, bowel sounds present  Musculoskeletal: No bilateral pedal edema noted  Skin: Normal  Psychiatry: Normal mood   Data Review: I have personally reviewed the laboratory data and studies available.  Recent Labs  Lab 02/28/19 0502 03/03/19 0449 03/04/19 0320 03/05/19 0420 03/06/19 0409  WBC 11.2* 13.3* 13.1* 10.9* 11.1*  NEUTROABS 7.4 11.5* 10.9* 8.8* 8.9*  HGB 12.8 11.6* 9.9* 10.2* 10.3*  HCT 39.8 36.2 30.2* 32.0* 31.9*  MCV 101.8* 102.5* 101.3* 102.6* 101.3*  PLT  186 177 164 197 229   Recent Labs  Lab 02/28/19 0502 03/01/19 0337 03/03/19 0449 03/05/19 0420 03/06/19 0409  NA 143 141 144 141 140  K 3.6 4.8 4.0 3.7 3.5  CL 103 107 111 110 108  CO2 31 25 24 22  21*  GLUCOSE 91 133* 157* 128* 111*  BUN 28* 35* 37* 38* 33*  CREATININE 1.00 0.95 0.97 1.06* 1.02*  CALCIUM 8.4* 8.4* 8.2* 7.8* 8.0*  MG 1.8  --  1.9  --   --   PHOS 3.0  --  3.2  --   --     Alma Friendly, MD  [facility] 03/06/2019

## 2019-03-06 NOTE — Progress Notes (Addendum)
Pharmacy Antibiotic Note  Tina Weaver is a 85 y.o. female admitted on 02/20/2019 with SBO and prolonged NPO - now with concern for aspiration PNA. Pharmacy has been consulted for Unasyn dosing.  WBC 11.1, the patient is afebrile, and renal function is stable. She has had some tachypnea with SpO2 95-96% on room air, now 100% with 3L O2 nasal cannula. This is day 4 of antibiotics.   Plan: - Continue Unasyn 1.5g IV, switch from every 8 hours to every 12 hours based on renal function - Will continue to follow renal function, culture results, LOT, and antibiotic de-escalation plans   Height: 5' (152.4 cm) Weight: 97 lb 3.6 oz (44.1 kg) IBW/kg (Calculated) : 45.5  Temp (24hrs), Avg:98.2 F (36.8 C), Min:98 F (36.7 C), Max:98.4 F (36.9 C)  Recent Labs  Lab 02/28/19 0502 03/01/19 0337 03/03/19 0449 03/04/19 0320 03/05/19 0420 03/06/19 0409  WBC 11.2*  --  13.3* 13.1* 10.9* 11.1*  CREATININE 1.00 0.95 0.97  --  1.06* 1.02*    Estimated Creatinine Clearance: 21.4 mL/min (A) (by C-G formula based on SCr of 1.02 mg/dL (H)).    Allergies  Allergen Reactions  . Norvasc [Amlodipine] Swelling    Lower extremity edema with 10 mg dose, tolerates 5mg  without problems. Lower extremity edema with 10 mg dose, tolerates 5mg  without problems.  . Alendronate Other (See Comments)    Pain to ext's  . Fosamax [Alendronate Sodium] Other (See Comments)    "Pain to legs" per pt's family member    Antimicrobials this admission: Unasyn 8/6 >>  Dose adjustments this admission: n/a  Microbiology results: 7/27 COVID >> neg 8/3 MRSA PCR >> neg  Thank you for allowing pharmacy to be a part of this patient's care.  Agnes Lawrence, PharmD PGY1 Pharmacy Resident

## 2019-03-06 NOTE — Progress Notes (Signed)
  Speech Language Pathology Treatment: Dysphagia  Patient Details Name: Tina Weaver MRN: [id number] DOB: 1919-03-28 Today's Date: 03/06/2019 Time: 2952-8413 SLP Time Calculation (min) (ACUTE ONLY): 12 min  Assessment / Plan / Recommendation Clinical Impression  Patient's son is in the room. He reports his mother is very sleepy and he has not been able to wake her. RN reports patient has not been eating meats due to inability to properly masticate. She is observed to tolerate purees well. Clear vocal quality throughout sessions. Recommend Dys 1 (edentulous and dentures are not present), nectar thickened liquids. Medication to be given whole in puree. ST to follow for diet tolerance, upgrade as pt improves.    HPI HPI: 54 M Bookeris a 85 y.o.femalewith medical history significant ofHTN, chronic diastolic CHF with hypertensive cardiomyopathy, AS. Patient presentedto the ED on 7/27with c/o abd pain for 3 days associated with nausea, generalized weakness. CT of the abdomen and pelvis demonstrated a high grade SBO. Patient ultimately underwent diagnostic laparoscopy with lysis of adhesions on 8/3.  Noted to have sudden tachypnea on 8/6, although saturating well on room air Afebrile, with leukocytosis. MD concerned about aspiration. CXR shows possible RUL infiltrate.       SLP Plan  Continue with current plan of care       Recommendations  Diet recommendations: Dysphagia 1 (puree);Nectar-thick liquid Liquids provided via: Cup Medication Administration: Whole meds with puree Supervision: Staff to assist with self feeding Compensations: Slow rate;Small sips/bites;Follow solids with liquid;Multiple dry swallows after each bite/sip Postural Changes and/or Swallow Maneuvers: Seated upright 90 degrees                Oral Care Recommendations: Oral care BID SLP Visit Diagnosis: Dysphagia, oropharyngeal phase (R13.12) Plan: Continue with current plan of care       [place], MA, CCC-SLP 03/06/2019 1:47 PM

## 2019-03-06 NOTE — Progress Notes (Signed)
Physical Therapy Treatment Patient Details Name: Tina Weaver MRN: [id number] DOB: 02-13-1919 Today's Date: 03/06/2019    History of Present Illness Pt is a 85 y/o female admitted secondary to increased abdominal pain. Found to have SBO and had NG tube placed; now s/p laparoscopy and lysis of adhesions on 8/3; . PMH includes HTN, cardiomyopathy, d HF, and aortic stenosis.     PT Comments    Patient with more fatigue than prior visits, transferred to chair with min a. If patient has support at home 24/7, HHPT appropriate.    Follow Up Recommendations  Home health PT;Supervision/Assistance - 24 hour     Equipment Recommendations  Rolling walker with 5" wheels(youth sized RW)    Recommendations for Other Services       Precautions / Restrictions Precautions Precautions: Fall Precaution Comments: watch RR Restrictions Weight Bearing Restrictions: No    Mobility  Bed Mobility Overal bed mobility: Modified Independent                Transfers Overall transfer level: Needs assistance Equipment used: Rolling walker (2 wheeled) Transfers: Sit to/from Stand Sit to Stand: Min assist         General transfer comment: min A to stand EOB with RW. unable to do so on her own from neutral height  Ambulation/Gait Ambulation/Gait assistance: Min guard   Assistive device: Rolling walker (2 wheeled) Gait Pattern/deviations: Step-through pattern;Decreased stride length;Trunk flexed Gait velocity: decreased   General Gait Details: increasd distance, VSS on RA, no LOB with RW   Stairs             Wheelchair Mobility    Modified Rankin (Stroke Patients Only)       Balance Overall balance assessment: Needs assistance Sitting-balance support: No upper extremity supported;Feet supported Sitting balance-Leahy Scale: Good     Standing balance support: Bilateral upper extremity supported;During functional activity Standing balance-Leahy Scale: Poor Standing balance  comment: Reliant on BUE support                             Cognition Arousal/Alertness: Awake/alert Behavior During Therapy: WFL for tasks assessed/performed;Flat affect Overall Cognitive Status: Difficult to assess                                 General Comments: overall appears appropriate and following commands, but pt not very talkative and soft spoken, so difficult to fully assess       Exercises      General Comments        Pertinent Vitals/Pain Faces Pain Scale: Hurts a little bit Pain Location: stomach Pain Descriptors / Indicators: Aching    Home Living                      Prior Function            PT Goals (current goals can now be found in the care plan section) Acute Rehab PT Goals Patient Stated Goal: to go home PT Goal Formulation: With patient Time For Goal Achievement: 03/09/19 Potential to Achieve Goals: Good    Frequency    Min 3X/week      PT Plan Current plan remains appropriate    Co-evaluation              AM-PAC PT "6 Clicks" Mobility   Outcome Measure  Help needed turning from  your back to your side while in a flat bed without using bedrails?: A Little Help needed moving from lying on your back to sitting on the side of a flat bed without using bedrails?: A Little Help needed moving to and from a bed to a chair (including a wheelchair)?: A Little Help needed standing up from a chair using your arms (e.g., wheelchair or bedside chair)?: A Lot Help needed to walk in hospital room?: A Little Help needed climbing 3-5 steps with a railing? : A Lot 6 Click Score: 16    End of Session Equipment Utilized During Treatment: Gait belt Activity Tolerance: Patient tolerated treatment well Patient left: in chair;with call bell/phone within reach Nurse Communication: Mobility status PT Visit Diagnosis: Other abnormalities of gait and mobility (R26.89);Muscle weakness (generalized) (M62.81)      Time: 1600-1620 PT Time Calculation (min) (ACUTE ONLY): 20 min  Charges:  $Gait Training: 8-22 mins                    Reinaldo Berber, PT, DPT Acute Rehabilitation Services Pager: [phone] Office: [phone]    Reinaldo Berber 03/06/2019, 5:00 PM

## 2019-03-07 LAB — CBC WITH DIFFERENTIAL/PLATELET
Abs Immature Granulocytes: 0.05 10*3/uL (ref 0.00–0.07)
Basophils Absolute: 0 10*3/uL (ref 0.0–0.1)
Basophils Relative: 0 %
Eosinophils Absolute: 0.3 10*3/uL (ref 0.0–0.5)
Eosinophils Relative: 4 %
HCT: 28.1 % — ABNORMAL LOW (ref 36.0–46.0)
Hemoglobin: 9.1 g/dL — ABNORMAL LOW (ref 12.0–15.0)
Immature Granulocytes: 1 %
Lymphocytes Relative: 15 %
Lymphs Abs: 1.2 10*3/uL (ref 0.7–4.0)
MCH: 32.5 pg (ref 26.0–34.0)
MCHC: 32.4 g/dL (ref 30.0–36.0)
MCV: 100.4 fL — ABNORMAL HIGH (ref 80.0–100.0)
Monocytes Absolute: 0.7 10*3/uL (ref 0.1–1.0)
Monocytes Relative: 8 %
Neutro Abs: 5.7 10*3/uL (ref 1.7–7.7)
Neutrophils Relative %: 72 %
Platelets: 251 10*3/uL (ref 150–400)
RBC: 2.8 MIL/uL — ABNORMAL LOW (ref 3.87–5.11)
RDW: 14.5 % (ref 11.5–15.5)
WBC: 8.1 10*3/uL (ref 4.0–10.5)
nRBC: 0 % (ref 0.0–0.2)

## 2019-03-07 LAB — BASIC METABOLIC PANEL
Anion gap: 9 (ref 5–15)
BUN: 26 mg/dL — ABNORMAL HIGH (ref 8–23)
CO2: 23 mmol/L (ref 22–32)
Calcium: 7.3 mg/dL — ABNORMAL LOW (ref 8.9–10.3)
Chloride: 106 mmol/L (ref 98–111)
Creatinine, Ser: 1.02 mg/dL — ABNORMAL HIGH (ref 0.44–1.00)
GFR calc Af Amer: 53 mL/min — ABNORMAL LOW (ref >60)
GFR calc non Af Amer: 46 mL/min — ABNORMAL LOW (ref >60)
Glucose, Bld: 90 mg/dL (ref 70–99)
Potassium: 3.5 mmol/L (ref 3.5–5.1)
Sodium: 138 mmol/L (ref 135–145)

## 2019-03-07 LAB — GLUCOSE, CAPILLARY
Glucose-Capillary: 84 mg/dL (ref 70–99)
Glucose-Capillary: 126 mg/dL — ABNORMAL HIGH (ref 70–99)
Glucose-Capillary: 123 mg/dL — ABNORMAL HIGH (ref 70–99)
Glucose-Capillary: 210 mg/dL — ABNORMAL HIGH (ref 70–99)

## 2019-03-07 MED ORDER — ADULT MULTIVITAMIN W/MINERALS CH
1.0000 | ORAL_TABLET | Freq: Every day | ORAL | Status: DC
  Administered 2019-03-07 – 2019-03-11 (×5): 1 via ORAL
  Filled 2019-03-07 (×5): qty 1

## 2019-03-07 MED ORDER — ENSURE ENLIVE PO LIQD
237.0000 mL | Freq: Three times a day (TID) | ORAL | Status: DC
  Administered 2019-03-07 – 2019-03-11 (×13): 237 mL via ORAL

## 2019-03-07 MED ORDER — CARVEDILOL 6.25 MG PO TABS
6.2500 mg | ORAL_TABLET | Freq: Two times a day (BID) | ORAL | Status: DC
  Administered 2019-03-07 – 2019-03-12 (×10): 6.25 mg via ORAL
  Filled 2019-03-07 (×10): qty 1

## 2019-03-07 NOTE — Progress Notes (Signed)
Nutrition Follow-up  DOCUMENTATION CODES:   Non-severe (moderate) malnutrition in context of chronic illness  INTERVENTION:   -Ensure Enlive po TID, each supplement provides 350 kcal and 20 grams of protein -Magic cup TID with meals, each supplement provides 290 kcal and 9 grams of protein -Hormel Shake TID with meals, each supplement provides 520 kcals and 22 grams protein -MVI with minerals daily -Feeding assistance with meals  NUTRITION DIAGNOSIS:   Moderate Malnutrition related to chronic illness(CHF) as evidenced by mild fat depletion, moderate fat depletion, mild muscle depletion, moderate muscle depletion.  Ongoing  GOAL:   Patient will meet greater than or equal to 90% of their needs  Progressing   MONITOR:   PO intake, Supplement acceptance, Diet advancement, Labs, Weight trends, Skin, I & O's  REASON FOR ASSESSMENT:   Consult New TPN/TNA  ASSESSMENT:   85 year old female who presented to the ED on 7/26 with nausea, abdominal pain, and weakness. PMH of HTN with LVH, hypertrophic cardiomyopathy, CHF, and aortic stenosis. Pt was found to have a SBO and NG tube placed by fluoro.  7/31- TPN start 8/3- diagnositic lap, lysis of adhesions 8/6- NGT d/c 8/7- TPN d/c, advanced to dysphagia 3 diet with nectar thick liquids 8/9- downgraded to dysphagia 1 diet with nectar thick liquids due to poor mastication  Reviewed I/O's: -260 ml x 24 hours and -3.7 L since admission  UOP: 900 ml x 24 hours  Pt resting quietly at time of visit. Pt's son has been assisting her with feeding lunch, however, lunch tray appeared untouched. Intake has been variable; noted documented meal completion 25-75%. Pt would greatly benefit from addition of nutritional supplements.   Therapies recommending home health services at discharge.   Labs reviewed: CBGS: 84-142 (inpatient orders for glycemic control are 0-9 units insulin aspart TID with meals).   Diet Order:   Diet Order           DIET - DYS 1 Room service appropriate? Yes; Fluid consistency: Nectar Thick  Diet effective now              EDUCATION NEEDS:   Education needs have been addressed  Skin:  Skin Assessment: Skin Integrity Issues: Skin Integrity Issues:: Incisions Incisions: closed abdomen  Last BM:  03/06/19  Height:   Ht Readings from Last 1 Encounters:  02/20/19 5' (1.524 m)    Weight:   Wt Readings from Last 1 Encounters:  03/04/19 44.1 kg    Ideal Body Weight:  45.5 kg  BMI:  Body mass index is 18.99 kg/m.  Estimated Nutritional Needs:   Kcal:  1250-1450  Protein:  60-75 grams  Fluid:  >/= 1.3 L    Durell Lofaso A. Jimmye Norman, RD, LDN, [place] Registered Dietitian II Certified Diabetes Care and Education Specialist Pager: [phone] After hours Pager: [phone]

## 2019-03-07 NOTE — Care Management Important Message (Signed)
Important Message  Patient Details  Name: ILIA DIMAANO MRN: [id number] Date of Birth: June 12, 1919   Medicare Important Message Given:  Yes     Geet Hosking 03/07/2019, 3:59 PM

## 2019-03-07 NOTE — Progress Notes (Signed)
Physical Therapy Treatment Patient Details Name: Tina Weaver MRN: [id number] DOB: 03/07/1919 Today's Date: 03/07/2019    History of Present Illness Pt is a 85 y/o female admitted secondary to increased abdominal pain. Found to have SBO and had NG tube placed; now s/p laparoscopy and lysis of adhesions on 8/3; . PMH includes HTN, cardiomyopathy, d HF, and aortic stenosis.     PT Comments    Patient ambulating short distances, requires hand on guarding and assistance. If family unable to provide will need to consider SNF. If patient can stay with family after d/c HHPT appropriate.       Follow Up Recommendations  Home health PT;Supervision/Assistance - 24 hour     Equipment Recommendations  Rolling walker with 5" wheels(youth sized RW)    Recommendations for Other Services       Precautions / Restrictions Precautions Precautions: Fall Precaution Comments: watch RR Restrictions Weight Bearing Restrictions: No    Mobility  Bed Mobility Overal bed mobility: Modified Independent                Transfers Overall transfer level: Needs assistance Equipment used: Rolling walker (2 wheeled) Transfers: Sit to/from Stand Sit to Stand: Min assist         General transfer comment: min A to stand EOB with RW. unable to do so on her own from neutral height  Ambulation/Gait Ambulation/Gait assistance: Min guard Gait Distance (Feet): 30 Feet Assistive device: Rolling walker (2 wheeled) Gait Pattern/deviations: Step-through pattern;Decreased stride length;Trunk flexed Gait velocity: decreased   General Gait Details: increasd distance, VSS on RA, no LOB with RW   Stairs             Wheelchair Mobility    Modified Rankin (Stroke Patients Only)       Balance Overall balance assessment: Needs assistance Sitting-balance support: No upper extremity supported;Feet supported Sitting balance-Leahy Scale: Good     Standing balance support: Bilateral upper  extremity supported;During functional activity Standing balance-Leahy Scale: Poor Standing balance comment: Reliant on BUE support                             Cognition Arousal/Alertness: Awake/alert Behavior During Therapy: WFL for tasks assessed/performed;Flat affect Overall Cognitive Status: Difficult to assess                                 General Comments: overall appears appropriate and following commands, but pt not very talkative and soft spoken, so difficult to fully assess       Exercises      General Comments        Pertinent Vitals/Pain Faces Pain Scale: Hurts a little bit Pain Location: stomach Pain Descriptors / Indicators: Aching    Home Living                      Prior Function            PT Goals (current goals can now be found in the care plan section) Acute Rehab PT Goals Patient Stated Goal: to go home PT Goal Formulation: With patient Time For Goal Achievement: 03/09/19 Potential to Achieve Goals: Good Progress towards PT goals: Progressing toward goals    Frequency    Min 3X/week      PT Plan Current plan remains appropriate    Co-evaluation  AM-PAC PT "6 Clicks" Mobility   Outcome Measure  Help needed turning from your back to your side while in a flat bed without using bedrails?: A Little Help needed moving from lying on your back to sitting on the side of a flat bed without using bedrails?: A Little Help needed moving to and from a bed to a chair (including a wheelchair)?: A Little Help needed standing up from a chair using your arms (e.g., wheelchair or bedside chair)?: A Lot Help needed to walk in hospital room?: A Little Help needed climbing 3-5 steps with a railing? : A Lot 6 Click Score: 16    End of Session Equipment Utilized During Treatment: Gait belt Activity Tolerance: Patient tolerated treatment well Patient left: in chair;with call bell/phone within  reach Nurse Communication: Mobility status PT Visit Diagnosis: Other abnormalities of gait and mobility (R26.89);Muscle weakness (generalized) (M62.81)     Time: 1340-1400 PT Time Calculation (min) (ACUTE ONLY): 20 min  Charges:  $Gait Training: 8-22 mins                    Reinaldo Berber, PT, DPT Acute Rehabilitation Services Pager: [phone] Office: [phone]     Reinaldo Berber 03/07/2019, 2:32 PM

## 2019-03-07 NOTE — Progress Notes (Signed)
PROGRESS NOTE  Tina Weaver JHE:[id number] DOB: May 05, 1919 DOA: 02/20/2019 PCP: Lajean Manes, MD   LOS: 14 days   Brief narrative: Tina Weaver a 85 y.o.femalewith medical history significant ofHTN, chronic diastolic CHF with hypertensive cardiomyopathy, AS. Patient presentedto the ED on 7/27with c/o abd pain for 3 days associated with nausea, generalized weakness. CT of the abdomen and pelvis demonstrated a high grade SBO. Patient was admitted for small bowel obstruction. General surgery was consulted andpatient was started on conservative management with IV fluid, bowel rest, pain medicine and NG tube suction. Repeat abdominal x-ray 7/30 slight interval increase in the size of the small bowel loops. Despite multiple days of conservative methods,patient did not clinically or radiographically improve. General surgery had multiple discussions with patient and her son/POA. Patient ultimately underwent diagnostic laparoscopy with lysis of adhesions on 8/3.  Subjective: Patient denies any new complaints.  Discussed extensively with her son, who stated he would not be able to provide 24-hour care for patient.  In agreement for patients to be transferred to SNF for rehab needs.  Patient awaiting SNF placement   Assessment/Plan:  Principal Problem:   SBO (small bowel obstruction) (HCC) Active Problems:   HTN (hypertension)   CKD (chronic kidney disease), stage III (HCC)   Chronic diastolic heart failure (HCC)   Malnutrition of moderate degree  High-grade small bowel obstruction status post laparoscopic adhesion lysis on 8/3 by general surgery CT scan finding as above. Failed conservative management. Had postoperative ileus, currently resolved S/P TPN, advanced to dysphagia diet, nectar thick General surgery on board, okay for discharge  ??Possible aspiration pneumonia/HCAP/reflux Noted to have sudden tachypnea, although saturating well on room air Afebrile, with resolved  leukocytosis ABG with pH of 7.54, CO2 26, PO2 94.6, bicarb 22.4 Chest x-ray with right upper lobe infiltrate CTA chest showed no PE, atelectasis in the right base, small amount of fluid in the esophagus likely due to reflux noted SLP consulted Nebulizers, supplemental oxygen PRN Continue IV Unasyn for total of 5 days Continue p.o. pantoprazole Monitor closely  Elevated d-dimer Likely due to recent surgery, currently tachypneic, although not hypoxic or tachycardic Ruled out PE, CTA chest unremarkable Monitor closely  Dysphagia SLP on board Change diet to dysphagia 3, nectar thick liquid Aspiration precautions  Cardiovascular issues:HTN,HLD, chronic diastolic CHF,moderate aortic stenosis, coronary artery disease, HOCM, CKD stage III ??  Acute on chronic diastolic HF Prior to admission, patient was on Coreg 18.75 mg twice daily, Lasix 20 mg dailyandCrestor 5 mg daily. Restart Lasix Cardiology consult appreciated.  Hypothyroidism Continue Synthroid   Mobility:Encourage ambulation.  Was able to ambulate prior to hospitalization. Diet:Dysphagia diet, thick nectar liquid DVT prophylaxis:Lovenox Code Status:Code Status: DNR Family Communication:Spoke to son extensively on 03/07/2019 Expected [facility] SN[facility] placement  Consultants:  General surgery  Procedures:  Laparoscopic adhesional lysis on 8/3  Antimicrobials:  Anti-infectives (From admission, onward)   Start     Dose/Rate Route Frequency Ordered Stop   03/07/19 0150  ampicillin-sulbactam (UNASYN) 1.5 g in sodium chloride 0.9 % 100 mL IVPB     1.5 g 200 mL/hr over 30 Minutes Intravenous Every 12 hours 03/06/19 1524     03/03/19 1200  ampicillin-sulbactam (UNASYN) 1.5 g in sodium chloride 0.9 % 100 mL IVPB  Status:  Discontinued     1.5 g 200 mL/hr over 30 Minutes Intravenous Every 8 hours 03/03/19 1103 03/06/19 1524   02/28/19 0721  ceFAZolin (ANCEF) 2-4 GM/100ML-% IVPB    Note to  Pharmacy: Laurita Quint   :  cabinet override      02/28/19 0721 02/28/19 1929   02/27/19 1230  ceFAZolin (ANCEF) IVPB 2g/100 mL premix     2 g 200 mL/hr over 30 Minutes Intravenous  Once 02/27/19 1158 02/27/19 1456      Infusions:  . sodium chloride 1,000 mL (03/06/19 2137)  . ampicillin-sulbactam (UNASYN) IV 1.5 g (03/07/19 1358)    Scheduled Meds: . carvedilol  6.25 mg Oral BID WC  . enoxaparin (LOVENOX) injection  30 mg Subcutaneous Q24H  . feeding supplement (ENSURE ENLIVE)  237 mL Oral TID BM  . furosemide  20 mg Oral Daily  . insulin aspart  0-9 Units Subcutaneous TID WC  . levothyroxine  88 mcg Oral Daily  . mouth rinse  15 mL Mouth Rinse BID  . multivitamin with minerals  1 tablet Oral Daily  . pantoprazole  40 mg Oral Daily  . sodium chloride flush  3 mL Intravenous Once    PRN meds: sodium chloride, acetaminophen **OR** acetaminophen, fentaNYL (SUBLIMAZE) injection, hydrALAZINE, ipratropium-albuterol, menthol-cetylpyridinium, metoprolol tartrate, morphine injection, ondansetron **OR** ondansetron (ZOFRAN) IV, phenol, Resource ThickenUp Clear, sodium chloride flush   Objective: Vitals:   03/07/19 0453 03/07/19 0933  BP: (!) 128/59 (!) 118/52  Pulse: 62 (!) 59  Resp: 18 18  Temp: 97.7 F (36.5 C) 98 F (36.7 C)  SpO2: 100% 99%    Intake/Output Summary (Last 24 hours) at 03/07/2019 1628 Last data filed at 03/07/2019 1500 Gross per 24 hour  Intake 1080 ml  Output 800 ml  Net 280 ml   Filed Weights   02/25/19 0300 03/03/19 2218 03/04/19 2020  Weight: 45 kg 44.3 kg 44.1 kg   Weight change:  Body mass index is 18.99 kg/m.   Physical Exam:  General: NAD   Cardiovascular: S1, S2 present  Respiratory: CTAB  Abdomen: Soft, nontender, +distended, bowel sounds present  Musculoskeletal: No bilateral pedal edema noted  Skin: Normal  Psychiatry: Normal mood   Data Review: I have personally reviewed the laboratory data and studies available.  Recent  Labs  Lab 03/03/19 0449 03/04/19 0320 03/05/19 0420 03/06/19 0409 03/07/19 0413  WBC 13.3* 13.1* 10.9* 11.1* 8.1  NEUTROABS 11.5* 10.9* 8.8* 8.9* 5.7  HGB 11.6* 9.9* 10.2* 10.3* 9.1*  HCT 36.2 30.2* 32.0* 31.9* 28.1*  MCV 102.5* 101.3* 102.6* 101.3* 100.4*  PLT 177 164 197 229 251   Recent Labs  Lab 03/01/19 0337 03/03/19 0449 03/05/19 0420 03/06/19 0409 03/07/19 0413  NA 141 144 141 140 138  K 4.8 4.0 3.7 3.5 3.5  CL 107 111 110 108 106  CO2 25 24 22  21* 23  GLUCOSE 133* 157* 128* 111* 90  BUN 35* 37* 38* 33* 26*  CREATININE 0.95 0.97 1.06* 1.02* 1.02*  CALCIUM 8.4* 8.2* 7.8* 8.0* 7.3*  MG  --  1.9  --   --   --   PHOS  --  3.2  --   --   --     Alma Friendly, MD  [facility] 03/07/2019

## 2019-03-07 NOTE — Plan of Care (Signed)
  Problem: Education: Goal: Knowledge of General Education information will improve Description Including pain rating scale, medication(s)/side effects and non-pharmacologic comfort measures Outcome: Progressing   

## 2019-03-07 NOTE — Progress Notes (Signed)
7 Days Post-Op   Subjective/Chief Complaint: Two bms per chart yesterday, eating breakfast   Objective: Vital signs in last 24 hours: Temp:  [97.7 F (36.5 C)-98.2 F (36.8 C)] 98 F (36.7 C) (08/10 0933) Pulse Rate:  [59-77] 59 (08/10 0933) Resp:  [18-30] 18 (08/10 0933) BP: (118-141)/(52-59) 118/52 (08/10 0933) SpO2:  [99 %-100 %] 99 % (08/10 0933) Last BM Date: 03/06/19  Intake/Output from previous day: 08/09 0701 - 08/10 0700 In: 640 [P.O.:420; I.V.:120; IV Piggyback:100] Out: 900 [Urine:900] Intake/Output this shift: No intake/output data recorded.  Mild distention (this may be baseline) but very soft, bs present, nontender  Lab Results:  Recent Labs    03/06/19 0409 03/07/19 0413  WBC 11.1* 8.1  HGB 10.3* 9.1*  HCT 31.9* 28.1*  PLT 229 251   BMET Recent Labs    03/06/19 0409 03/07/19 0413  NA 140 138  K 3.5 3.5  CL 108 106  CO2 21* 23  GLUCOSE 111* 90  BUN 33* 26*  CREATININE 1.02* 1.02*  CALCIUM 8.0* 7.3*   PT/INR No results for input(s): LABPROT, INR in the last 72 hours. ABG No results for input(s): PHART, HCO3 in the last 72 hours.  Invalid input(s): PCO2, PO2  Studies/Results: Dg Abd 2 Views  Result Date: 03/05/2019 CLINICAL DATA:  Recent small bowel obstruction. EXAM: ABDOMEN - 2 VIEW COMPARISON:  02/25/2019 FINDINGS: Barium is seen in the colon. No evidence of small bowel dilatation. No free air. IMPRESSION: Retained barium in the colon.  Resolved small bowel obstruction. Electronically Signed   By: Aletta Edouard M.D.   On: 03/05/2019 13:22    Anti-infectives: Anti-infectives (From admission, onward)   Start     Dose/Rate Route Frequency Ordered Stop   03/07/19 0150  ampicillin-sulbactam (UNASYN) 1.5 g in sodium chloride 0.9 % 100 mL IVPB     1.5 g 200 mL/hr over 30 Minutes Intravenous Every 12 hours 03/06/19 1524     03/03/19 1200  ampicillin-sulbactam (UNASYN) 1.5 g in sodium chloride 0.9 % 100 mL IVPB  Status:  Discontinued     1.5 g 200 mL/hr over 30 Minutes Intravenous Every 8 hours 03/03/19 1103 03/06/19 1524   02/28/19 0721  ceFAZolin (ANCEF) 2-4 GM/100ML-% IVPB    Note to Pharmacy: Laurita Quint   : cabinet override      02/28/19 0721 02/28/19 1929   02/27/19 1230  ceFAZolin (ANCEF) IVPB 2g/100 mL premix     2 g 200 mL/hr over 30 Minutes Intravenous  Once 02/27/19 1158 02/27/19 1456      Assessment/Plan: Hypernatremia/hyperchloremia/hypokalemia- resolved Hx hypertension Hx chronic diastolic congestive heart failure Hx stage III kidney disease Hypothyroid Urinary retention-appears foley catheter had to be replaced  SBO due to adhesions Diagnostic laparoscopy with lysis of adhesions 02/28/2019 Dr. Ralene Ok.- POD6 - WBC normal today -D3 diet and seems to be tolerating this. - Mobilize with PT/OT -surgically stable FEN:D3 diet FY:BOFBPZ (PNA - per primary) WCH:ENID,POEUMPN Follow-up: Dr. Rosendo Gros ,can dc whenever ready from our standpoint  Rolm Bookbinder 03/07/2019

## 2019-03-07 NOTE — Discharge Instructions (Signed)
YOUR CARDIOLOGY TEAM HAS ARRANGED FOR AN E-VISIT FOR YOUR APPOINTMENT - PLEASE REVIEW IMPORTANT INFORMATION BELOW SEVERAL DAYS PRIOR TO YOUR APPOINTMENT  Due to the recent COVID-19 pandemic, we are transitioning in-person office visits to tele-medicine visits in an effort to decrease unnecessary exposure to our patients, their families, and staff. These visits are billed to your insurance just like a normal visit is. We also encourage you to sign up for MyChart if you have not already done so. You will need a smartphone if possible. For patients that do not have this, we can still complete the visit using a regular telephone but do prefer a smartphone to enable video when possible. You may have a family member that lives with you that can help. If possible, we also ask that you have a blood pressure cuff and scale at home to measure your blood pressure, heart rate and weight prior to your scheduled appointment. Patients with clinical needs that need an in-person evaluation and testing will still be able to come to the office if absolutely necessary. If you have any questions, feel free to call our office.    THE DAY OF YOUR APPOINTMENT  Approximately 15 minutes prior to your scheduled appointment, you will receive a telephone call from one of [facility] team - your caller ID may say "Unknown caller."  Our staff will confirm medications, vital signs for the day and any symptoms you may be experiencing. Please have this information available prior to the time of visit start. It may also be helpful for you to have a pad of paper and pen handy for any instructions given during your visit. They will also walk you through joining the smartphone meeting if this is a video visit.    CONSENT FOR TELE-HEALTH VISIT - PLEASE REVIEW  I hereby voluntarily request, consent and authorize [facility] and its employed or contracted physicians, physician assistants, nurse practitioners or other licensed health care  professionals (the Practitioner), to provide me with telemedicine health care services (the Services") as deemed necessary by the treating Practitioner. I acknowledge and consent to receive the Services by the Practitioner via telemedicine. I understand that the telemedicine visit will involve communicating with the Practitioner through live audiovisual communication technology and the disclosure of certain medical information by electronic transmission. I acknowledge that I have been given the opportunity to request an in-person assessment or other available alternative prior to the telemedicine visit and am voluntarily participating in the telemedicine visit.  I understand that I have the right to withhold or withdraw my consent to the use of telemedicine in the course of my care at any time, without affecting my right to future care or treatment, and that the Practitioner or I may terminate the telemedicine visit at any time. I understand that I have the right to inspect all information obtained and/or recorded in the course of the telemedicine visit and may receive copies of available information for a reasonable fee.  I understand that some of the potential risks of receiving the Services via telemedicine include:   Delay or interruption in medical evaluation due to technological equipment failure or disruption;  Information transmitted may not be sufficient (e.g. poor resolution of images) to allow for appropriate medical decision making by the Practitioner; and/or   In rare instances, security protocols could fail, causing a breach of personal health information.  Furthermore, I acknowledge that it is my responsibility to provide information about my medical history, conditions and care that is complete  and accurate to the best of my ability. I acknowledge that Practitioner's advice, recommendations, and/or decision may be based on factors not within their control, such as incomplete or inaccurate  data provided by me or distortions of diagnostic images or specimens that may result from electronic transmissions. I understand that the practice of medicine is not an exact science and that Practitioner makes no warranties or guarantees regarding treatment outcomes. I acknowledge that I will receive a copy of this consent concurrently upon execution via email to the email address I last provided but may also request a printed copy by calling the office of [facility].    I understand that my insurance will be billed for this visit.   I have read or had this consent read to me.  I understand the contents of this consent, which adequately explains the benefits and risks of the Services being provided via telemedicine.   I have been provided ample opportunity to ask questions regarding this consent and the Services and have had my questions answered to my satisfaction.  I give my informed consent for the services to be provided through the use of telemedicine in my medical care  By participating in this telemedicine visit I agree to the above.   [facility], P.A.  Please arrive at least 30 min before your appointment to complete your check in paperwork.  If you are unable to arrive 30 min prior to your appointment time we may have to cancel or reschedule you. LAPAROSCOPIC SURGERY: POST OP INSTRUCTIONS Always review your discharge instruction sheet given to you by the facility where your surgery was performed. IF YOU HAVE DISABILITY OR FAMILY LEAVE FORMS, YOU MUST BRING THEM TO THE OFFICE FOR PROCESSING.   DO NOT GIVE THEM TO YOUR DOCTOR.  PAIN CONTROL  1. First take acetaminophen (Tylenol) AND/or ibuprofen (Advil) to control your pain after surgery.  Follow directions on package.  Taking acetaminophen (Tylenol) and/or ibuprofen (Advil) regularly after surgery will help to control your pain and lower the amount of prescription pain medication you may need.  You should not  take more than 4,000 mg (4 grams) of acetaminophen (Tylenol) in 24 hours.  You should not take ibuprofen (Advil), aleve, motrin, naprosyn or other NSAIDS if you have a history of stomach ulcers or chronic kidney disease.  2. A prescription for pain medication may be given to you upon discharge.  Take your pain medication as prescribed, if you still have uncontrolled pain after taking acetaminophen (Tylenol) or ibuprofen (Advil). 3. Use ice packs to help control pain. 4. If you need a refill on your pain medication, please contact your pharmacy.  They will contact our office to request authorization. Prescriptions will not be filled after 5pm or on week-ends.  HOME MEDICATIONS 5. Take your usually prescribed medications unless otherwise directed.  DIET 6. You should follow a light diet the first few days after arrival home.  Be sure to include lots of fluids daily. Avoid fatty, fried foods.   CONSTIPATION 7. It is common to experience some constipation after surgery and if you are taking pain medication.  Increasing fluid intake and taking a stool softener (such as Colace) will usually help or prevent this problem from occurring.  A mild laxative (Milk of Magnesia or Miralax) should be taken according to package instructions if there are no bowel movements after 48 hours.  WOUND/INCISION CARE 8. Most patients will experience some swelling and bruising in the area of the  incisions.  Ice packs will help.  Swelling and bruising can take several days to resolve.  9. Unless discharge instructions indicate otherwise, follow guidelines below  a. STERI-STRIPS - you may remove your outer bandages 48 hours after surgery, and you may shower at that time.  You have steri-strips (small skin tapes) in place directly over the incision.  These strips should be left on the skin for 7-10 days.   b. DERMABOND/SKIN GLUE - you may shower in 24 hours.  The glue will flake off over the next 2-3 weeks. 10. Any sutures or  staples will be removed at the office during your follow-up visit.  ACTIVITIES 11. You may resume regular (light) daily activities beginning the next day--such as daily self-care, walking, climbing stairs--gradually increasing activities as tolerated.  You may have sexual intercourse when it is comfortable.  Refrain from any heavy lifting or straining until approved by your doctor. a. You may drive when you are no longer taking prescription pain medication, you can comfortably wear a seatbelt, and you can safely maneuver your car and apply brakes.  FOLLOW-UP 12. You should see your doctor in the office for a follow-up appointment approximately 2-3 weeks after your surgery.  You should have been given your post-op/follow-up appointment when your surgery was scheduled.  If you did not receive a post-op/follow-up appointment, make sure that you call for this appointment within a day or two after you arrive home to insure a convenient appointment time.  OTHER INSTRUCTIONS  WHEN TO CALL YOUR DOCTOR: 1. Fever over 101.0 2. Inability to urinate 3. Continued bleeding from incision. 4. Increased pain, redness, or drainage from the incision. 5. Increasing abdominal pain  The clinic staff is available to answer your questions during regular business hours.  Please dont hesitate to call and ask to speak to one of the nurses for clinical concerns.  If you have a medical emergency, go to the nearest emergency room or call 911.  A surgeon from [facility] is always on call at the hospital. [address], [place], [place], [place]  [address] ? [address], [place], [place]   [address] [phone] ? [phone] ? FAX [phone]

## 2019-03-08 LAB — GLUCOSE, CAPILLARY
Glucose-Capillary: 178 mg/dL — ABNORMAL HIGH (ref 70–99)
Glucose-Capillary: 89 mg/dL (ref 70–99)
Glucose-Capillary: 126 mg/dL — ABNORMAL HIGH (ref 70–99)
Glucose-Capillary: 122 mg/dL — ABNORMAL HIGH (ref 70–99)

## 2019-03-08 NOTE — Progress Notes (Signed)
Physical Therapy Treatment Patient Details Name: Tina Weaver MRN: [id number] DOB: 31-Dec-1918 Today's Date: 03/08/2019    History of Present Illness Pt is a 85 y/o female admitted secondary to increased abdominal pain. Found to have SBO and had NG tube placed; now s/p laparoscopy and lysis of adhesions on 8/3; . PMH includes HTN, cardiomyopathy, d HF, and aortic stenosis.     PT Comments    Patient ambulating short distances today, more tachypneic with less distance, VSS on RA. Cont to rec HHPT if family support avail at d/c.    Follow Up Recommendations  Home health PT;Supervision/Assistance - 24 hour     Equipment Recommendations  Rolling walker with 5" wheels(youth sized RW)    Recommendations for Other Services       Precautions / Restrictions Precautions Precautions: Fall Precaution Comments: watch RR Restrictions Weight Bearing Restrictions: No    Mobility  Bed Mobility Overal bed mobility: Modified Independent                Transfers Overall transfer level: Needs assistance Equipment used: Rolling walker (2 wheeled) Transfers: Sit to/from Stand Sit to Stand: Min assist         General transfer comment: min A to stand EOB with RW. unable to do so on her own from neutral height  Ambulation/Gait Ambulation/Gait assistance: Min guard Gait Distance (Feet): 20 Feet Assistive device: Rolling walker (2 wheeled) Gait Pattern/deviations: Step-through pattern;Decreased stride length;Trunk flexed Gait velocity: decreased   General Gait Details: more tachypneic today with shorter bouts of walking. VSs   Stairs             Wheelchair Mobility    Modified Rankin (Stroke Patients Only)       Balance Overall balance assessment: Needs assistance Sitting-balance support: No upper extremity supported;Feet supported Sitting balance-Leahy Scale: Good     Standing balance support: Bilateral upper extremity supported;During functional  activity Standing balance-Leahy Scale: Poor Standing balance comment: Reliant on BUE support                             Cognition Arousal/Alertness: Awake/alert Behavior During Therapy: WFL for tasks assessed/performed;Flat affect Overall Cognitive Status: Difficult to assess                                 General Comments: overall appears appropriate and following commands, but pt not very talkative and soft spoken, so difficult to fully assess       Exercises      General Comments        Pertinent Vitals/Pain Pain Assessment: Faces Faces Pain Scale: Hurts a little bit Pain Location: stomach Pain Descriptors / Indicators: Aching    Home Living                      Prior Function            PT Goals (current goals can now be found in the care plan section) Acute Rehab PT Goals Patient Stated Goal: to go home PT Goal Formulation: With patient Time For Goal Achievement: 03/09/19 Potential to Achieve Goals: Good Progress towards PT goals: Progressing toward goals    Frequency    Min 3X/week      PT Plan Current plan remains appropriate    Co-evaluation  AM-PAC PT "6 Clicks" Mobility   Outcome Measure  Help needed turning from your back to your side while in a flat bed without using bedrails?: A Little Help needed moving from lying on your back to sitting on the side of a flat bed without using bedrails?: A Little Help needed moving to and from a bed to a chair (including a wheelchair)?: A Little Help needed standing up from a chair using your arms (e.g., wheelchair or bedside chair)?: A Lot Help needed to walk in hospital room?: A Little Help needed climbing 3-5 steps with a railing? : A Lot 6 Click Score: 16    End of Session Equipment Utilized During Treatment: Gait belt Activity Tolerance: Patient tolerated treatment well Patient left: in chair;with call bell/phone within reach Nurse  Communication: Mobility status PT Visit Diagnosis: Other abnormalities of gait and mobility (R26.89);Muscle weakness (generalized) (M62.81)     Time: 1020-1040 PT Time Calculation (min) (ACUTE ONLY): 20 min  Charges:  $Gait Training: 8-22 mins                     Reinaldo Berber, PT, DPT Acute Rehabilitation Services Pager: [phone] Office: [phone]     Reinaldo Berber 03/08/2019, 1:01 PM

## 2019-03-08 NOTE — Progress Notes (Signed)
PROGRESS NOTE  Tina Weaver RCV:[id number] DOB: Jul 14, 1919 DOA: 02/20/2019 PCP: Lajean Manes, MD   LOS: 15 days   Brief narrative: Tina Alverson Bookeris a 85 y.o.femalewith medical history significant ofHTN, chronic diastolic CHF with hypertensive cardiomyopathy, AS. Patient presentedto the ED on 7/27with c/o abd pain for 3 days associated with nausea, generalized weakness. CT of the abdomen and pelvis demonstrated a high grade SBO. Patient was admitted for small bowel obstruction. General surgery was consulted andpatient was started on conservative management with IV fluid, bowel rest, pain medicine and NG tube suction. Repeat abdominal x-ray 7/30 slight interval increase in the size of the small bowel loops. Despite multiple days of conservative methods,patient did not clinically or radiographically improve. General surgery had multiple discussions with patient and her son/POA. Patient ultimately underwent diagnostic laparoscopy with lysis of adhesions on 8/3.  Subjective: Patient denies any new complaints, denies any shortness of breath, chest pain, abdominal pain, nausea/vomiting.   Assessment/Plan:  Principal Problem:   SBO (small bowel obstruction) (HCC) Active Problems:   HTN (hypertension)   CKD (chronic kidney disease), stage III (HCC)   Chronic diastolic heart failure (HCC)   Malnutrition of moderate degree  High-grade small bowel obstruction status post laparoscopic adhesion lysis on 8/3 by general surgery CT scan finding as above. Failed conservative management. Had postoperative ileus, currently resolved S/P TPN, advanced to dysphagia diet, nectar thick General surgery on board, okay for discharge, signed off  ??Possible aspiration pneumonia/HCAP/reflux Noted to have sudden tachypnea, although saturating well on room air Afebrile, with resolved leukocytosis ABG with pH of 7.54, CO2 26, PO2 94.6, bicarb 22.4 Chest x-ray with right upper lobe infiltrate CTA chest  showed no PE, atelectasis in the right base, small amount of fluid in the esophagus likely due to reflux noted SLP consulted Nebulizers, supplemental oxygen PRN S/P IV Unasyn for total of 5 days Continue p.o. pantoprazole  Elevated d-dimer Likely due to recent surgery, not hypoxic or tachycardic Ruled out PE, CTA chest unremarkable Monitor closely  Dysphagia SLP on board Change diet to dysphagia 3, nectar thick liquid Aspiration precautions  Cardiovascular issues:HTN,HLD, chronic diastolic CHF,moderate aortic stenosis, coronary artery disease, HOCM, CKD stage III ??  Acute on chronic diastolic HF Prior to admission, patient was on Coreg 18.75 mg twice daily, Lasix 20 mg dailyandCrestor 5 mg daily. Restart Lasix, reduce Coreg to 6.25 twice daily Cardiology consult appreciated.  Hypothyroidism Continue Synthroid   Mobility:Encourage ambulation.  Was able to ambulate prior to hospitalization. Diet:Dysphagia diet, thick nectar liquid DVT prophylaxis:Lovenox Code Status:Code Status: DNR Family Communication:Spoke to son extensively on 03/07/2019 Expected [facility] placement  Consultants:  General surgery  Procedures:  Laparoscopic adhesional lysis on 8/3  Antimicrobials:  Anti-infectives (From admission, onward)   Start     Dose/Rate Route Frequency Ordered Stop   03/07/19 0150  ampicillin-sulbactam (UNASYN) 1.5 g in sodium chloride 0.9 % 100 mL IVPB  Status:  Discontinued     1.5 g 200 mL/hr over 30 Minutes Intravenous Every 12 hours 03/06/19 1524 03/08/19 0945   03/03/19 1200  ampicillin-sulbactam (UNASYN) 1.5 g in sodium chloride 0.9 % 100 mL IVPB  Status:  Discontinued     1.5 g 200 mL/hr over 30 Minutes Intravenous Every 8 hours 03/03/19 1103 03/06/19 1524   02/28/19 0721  ceFAZolin (ANCEF) 2-4 GM/100ML-% IVPB    Note to Pharmacy: Laurita Quint   : cabinet override      02/28/19 0721 02/28/19 1929   02/27/19 1230  ceFAZolin (ANCEF)  IVPB 2g/100 mL premix     2 g 200 mL/hr over 30 Minutes Intravenous  Once 02/27/19 1158 02/27/19 1456      Infusions:  . sodium chloride 1,000 mL (03/06/19 2137)    Scheduled Meds: . carvedilol  6.25 mg Oral BID WC  . enoxaparin (LOVENOX) injection  30 mg Subcutaneous Q24H  . feeding supplement (ENSURE ENLIVE)  237 mL Oral TID BM  . furosemide  20 mg Oral Daily  . insulin aspart  0-9 Units Subcutaneous TID WC  . levothyroxine  88 mcg Oral Daily  . mouth rinse  15 mL Mouth Rinse BID  . multivitamin with minerals  1 tablet Oral Daily  . pantoprazole  40 mg Oral Daily  . sodium chloride flush  3 mL Intravenous Once    PRN meds: sodium chloride, acetaminophen **OR** acetaminophen, fentaNYL (SUBLIMAZE) injection, hydrALAZINE, ipratropium-albuterol, menthol-cetylpyridinium, metoprolol tartrate, morphine injection, ondansetron **OR** ondansetron (ZOFRAN) IV, phenol, Resource ThickenUp Clear, sodium chloride flush   Objective: Vitals:   03/08/19 0600 03/08/19 0833  BP: (!) 140/55 (!) 167/60  Pulse:  74  Resp: 18 18  Temp: 98 F (36.7 C)   SpO2: 98% 97%    Intake/Output Summary (Last 24 hours) at 03/08/2019 1146 Last data filed at 03/08/2019 0945 Gross per 24 hour  Intake 838.17 ml  Output 1250 ml  Net -411.83 ml   Filed Weights   02/25/19 0300 03/03/19 2218 03/04/19 2020  Weight: 45 kg 44.3 kg 44.1 kg   Weight change:  Body mass index is 18.99 kg/m.   Physical Exam:  General: NAD   Cardiovascular: S1, S2 present  Respiratory: CTAB  Abdomen: Soft, nontender, +distended, bowel sounds present  Musculoskeletal: No bilateral pedal edema noted  Skin: Normal  Psychiatry: Normal mood  Data Review: I have personally reviewed the laboratory data and studies available.  Recent Labs  Lab 03/03/19 0449 03/04/19 0320 03/05/19 0420 03/06/19 0409 03/07/19 0413  WBC 13.3* 13.1* 10.9* 11.1* 8.1  NEUTROABS 11.5* 10.9* 8.8* 8.9* 5.7  HGB 11.6* 9.9* 10.2* 10.3* 9.1*   HCT 36.2 30.2* 32.0* 31.9* 28.1*  MCV 102.5* 101.3* 102.6* 101.3* 100.4*  PLT 177 164 197 229 251   Recent Labs  Lab 03/03/19 0449 03/05/19 0420 03/06/19 0409 03/07/19 0413  NA 144 141 140 138  K 4.0 3.7 3.5 3.5  CL 111 110 108 106  CO2 24 22 21* 23  GLUCOSE 157* 128* 111* 90  BUN 37* 38* 33* 26*  CREATININE 0.97 1.06* 1.02* 1.02*  CALCIUM 8.2* 7.8* 8.0* 7.3*  MG 1.9  --   --   --   PHOS 3.2  --   --   --     Alma Friendly, MD  [facility] 03/08/2019

## 2019-03-08 NOTE — TOC Progression Note (Signed)
Transition of Care [facility]) - Progression Note    Patient Details  Name: KEELIN NEVILLE MRN: [id number] Date of Birth: [birth year]  Transition of Care [facility]) CM/SW Contact  Bartholomew Crews, RN Phone Number: [phone] 03/08/2019, 11:54 AM  Clinical Narrative:    Received call from patient's son, Lawrencia Mauney, stating that he had spoken with MD about patient needing rehab vs home with [facility]. Agreed with MD decision. Discussed choice for rehab facilities - Cherlynn Kaiser advised that [place] was first choice d/t proximity and [facility] 2nd choice d/t having been there in 2016. Advised Mr. Coscia that information would be faxed throughout [facility] in search of rehab bed availability - Mr. Lair in agreement. FL2 completed and faxed out to [facility] facilities along with therapy notes. CM to follow for transition of care needs.    Expected Discharge Plan: [place] Barriers to Discharge: Continued Medical Work up  Expected Discharge Plan and Services Expected Discharge Plan: [place] In-house Referral: [facility] Discharge Planning Services: CM Consult Post Acute Care Choice: Durable Medical Equipment, Home Health Living arrangements for the past 2 months: Single Family Home                 DME Arranged: 3-N-1 DME Agency: [employer] Date DME Agency Contacted: 03/02/19 Time DME Agency Contacted: 1222 Representative spoke with at DME Agency: zach HH Arranged: RN, PT, Nurse's Aide [place] Agency: [place] ([place]) Date [place]: 03/02/19 Time [place]: 1223 Representative spoke with at [place]: [place] ([place]) Interventions    Readmission Risk Interventions No flowsheet data found.

## 2019-03-08 NOTE — Progress Notes (Signed)
  Speech Language Pathology Treatment: Dysphagia  Patient Details Name: Tina Weaver MRN: [id number] DOB: 20-Dec-1918 Today's Date: 03/08/2019 Time: 4967-5916 SLP Time Calculation (min) (ACUTE ONLY): 14 min  Assessment / Plan / Recommendation Clinical Impression  Pt asleep on SLP arrival but easily roused.  Pt exhibited good oral clearance of regular solids, soft solids, and puree.  Pt tolerated nectar thick liquid by cup with no clinical s/s of aspiration.  Pt stated that she was not bothered by texture of puree food, but was agreeable to advancing diet texture.  Son arrived and was agreeable as well.  He thinks that maybe Sunday was a bad day.  If pt has any difficulty with advanced diet texture, please change consistency back to puree. Recommend mechanical soft diet with nectar thick liquids.    HPI HPI: Tina Weaver is a 85 y.o. female with medical history significant of HTN, chronic diastolic CHF with hypertensive cardiomyopathy, AS. Patient presented to the ED on 7/27 with c/o abd pain for 3 days associated with nausea, generalized weakness. CT of the abdomen and pelvis demonstrated a high grade SBO. Patient ultimately underwent diagnostic laparoscopy with lysis of adhesions on 8/3. Noted to have sudden tachypnea on 8/6, although saturating well on room air Afebrile, with leukocytosis. MD concerned about aspiration. CXR shows possible RUL infiltrate.      SLP Plan  Continue with current plan of care       Recommendations  Diet recommendations: Dysphagia 3 (mechanical soft);Nectar-thick liquid Liquids provided via: Cup Medication Administration: Whole meds with puree Supervision: Staff to assist with self feeding Compensations: Slow rate;Small sips/bites;Follow solids with liquid Postural Changes and/or Swallow Maneuvers: Seated upright 90 degrees                Oral Care Recommendations: Oral care BID SLP Visit Diagnosis: Dysphagia, oropharyngeal phase (R13.12) Plan: Continue  with current plan of care       [place], [place], [place] Office: [phone]; Pager [phone]): [phone] 03/08/2019, 11:28 AM

## 2019-03-08 NOTE — Progress Notes (Signed)
[facility] Progress Note  8 Days Post-Op  Subjective: CC-  No complaints this morning. She denies abdominal pain or bloating. Denies n/v. States that she ate about 50% of her breakfast this morning. BM x2 recorded yesterday. No flatus today but thinks that she passed some gas yesterday. Denies CP or SOB.  Objective: Vital signs in last 24 hours: Temp:  [98 F (36.7 C)-98.3 F (36.8 C)] 98 F (36.7 C) (08/11 0600) Pulse Rate:  [59-74] 74 (08/11 0833) Resp:  [18] 18 (08/11 0833) BP: (140-167)/(50-60) 167/60 (08/11 0833) SpO2:  [96 %-98 %] 97 % (08/11 0833) Last BM Date: 03/07/19  Intake/Output from previous day: 08/10 0701 - 08/11 0700 In: 958.2 [P.O.:660; I.V.:148.2; IV Piggyback:150] Out: 1250 [Urine:1250] Intake/Output this shift: No intake/output data recorded.  PE: Gen:  Alert, NAD, pleasant Pulm:  Mild tachypnea, effort normal Abd: Soft, protuberant, nontender, +BS, lap incisions cdi without erythema or drainage Skin: warm and dry  Lab Results:  Recent Labs    03/06/19 0409 03/07/19 0413  WBC 11.1* 8.1  HGB 10.3* 9.1*  HCT 31.9* 28.1*  PLT 229 251   BMET Recent Labs    03/06/19 0409 03/07/19 0413  NA 140 138  K 3.5 3.5  CL 108 106  CO2 21* 23  GLUCOSE 111* 90  BUN 33* 26*  CREATININE 1.02* 1.02*  CALCIUM 8.0* 7.3*   PT/INR No results for input(s): LABPROT, INR in the last 72 hours. CMP     Component Value Date/Time   NA 138 03/07/2019 0413   K 3.5 03/07/2019 0413   CL 106 03/07/2019 0413   CO2 23 03/07/2019 0413   GLUCOSE 90 03/07/2019 0413   BUN 26 (H) 03/07/2019 0413   CREATININE 1.02 (H) 03/07/2019 0413   CREATININE 1.00 (H) 05/02/2016 0830   CALCIUM 7.3 (L) 03/07/2019 0413   PROT 5.7 (L) 03/03/2019 0449   ALBUMIN 2.1 (L) 03/03/2019 0449   AST 34 03/03/2019 0449   ALT 20 03/03/2019 0449   ALKPHOS 78 03/03/2019 0449   BILITOT 0.4 03/03/2019 0449   GFRNONAA 46 (L) 03/07/2019 0413   GFRAA 53 (L) 03/07/2019 0413    Lipase     Component Value Date/Time   LIPASE 34 02/20/2019 2253       Studies/Results: No results found.  Anti-infectives: Anti-infectives (From admission, onward)   Start     Dose/Rate Route Frequency Ordered Stop   03/07/19 0150  ampicillin-sulbactam (UNASYN) 1.5 g in sodium chloride 0.9 % 100 mL IVPB     1.5 g 200 mL/hr over 30 Minutes Intravenous Every 12 hours 03/06/19 1524     03/03/19 1200  ampicillin-sulbactam (UNASYN) 1.5 g in sodium chloride 0.9 % 100 mL IVPB  Status:  Discontinued     1.5 g 200 mL/hr over 30 Minutes Intravenous Every 8 hours 03/03/19 1103 03/06/19 1524   02/28/19 0721  ceFAZolin (ANCEF) 2-4 GM/100ML-% IVPB    Note to Pharmacy: Laurita Quint   : cabinet override      02/28/19 0721 02/28/19 1929   02/27/19 1230  ceFAZolin (ANCEF) IVPB 2g/100 mL premix     2 g 200 mL/hr over 30 Minutes Intravenous  Once 02/27/19 1158 02/27/19 1456       Assessment/Plan Hypernatremia/hyperchloremia/hypokalemia- resolved Hx hypertension Hx chronic diastolic congestive heart failure Hx stage III kidney disease Hypothyroid Urinary retention-appears foley catheter had to be replaced  SBO due to adhesions Diagnostic laparoscopy with lysis of adhesions 02/28/2019 Dr. Ralene Ok.- POD8 - tolerating  diet and having bowel function - Mobilize with PT/OT -surgically stable  FEN:D1 diet DI:XBOERQ 8/6>> (PNA - per primary) SXQ:KSKS,HNGITJL Foley: in place Follow-up: Dr. Rosendo Gros ,can dc whenever ready from our standpoint  Plan: Patient stable for discharge from surgical standpoint. Discharge instructions and follow up info on AVS. General surgery will sign off, please call us with any concerns.   LOS: 15 days    Wellington Hampshire , [facility] 03/08/2019, 9:37 AM Pager: [phone] Mon-Thurs 7:00 am-4:30 pm Fri 7:00 am -11:30 AM Sat-Sun 7:00 am-11:30 am

## 2019-03-08 NOTE — Plan of Care (Signed)
  Problem: Clinical Measurements: Goal: Ability to maintain clinical measurements within normal limits will improve Outcome: Progressing   

## 2019-03-08 NOTE — TOC Progression Note (Signed)
Transition of Care [facility]) - Progression Note    Patient Details  Name: Tina Weaver MRN: [id number] Date of Birth: 20-Oct-1918  [facility] CM/SW Contact  Bartholomew Crews, RN Phone Number: [phone] 03/08/2019, 4:17 PM  Clinical Narrative:    Family selected [facility]. Facility is initiating authorization. Patient will need repeat covid before discharging to SNF. Will continue to follow for transition of care needs.    Expected Discharge Plan: [place] Barriers to Discharge: Continued Medical Work up  Expected Discharge Plan and Services Expected Discharge Plan: [place] In-house Referral: [facility] Discharge Planning Services: CM Consult Post Acute Care Choice: Durable Medical Equipment, Home Health Living arrangements for the past 2 months: Single Family Home                 DME Arranged: 3-N-1 DME Agency: [employer] Date DME Agency Contacted: 03/02/19 Time DME Agency Contacted: 1222 Representative spoke with at DME Agency: zach HH Arranged: RN, PT, Nurse's Aide [place] Agency: [place] ([place]) Date [place]: 03/02/19 Time [place]: 1223 Representative spoke with at [place]: [place] ([place]) Interventions    Readmission Risk Interventions No flowsheet data found.

## 2019-03-08 NOTE — NC FL2 (Signed)
[place] MEDICAID FL2 LEVEL OF CARE SCREENING TOOL     IDENTIFICATION  Patient Name: Tina Weaver Birthdate: 04-17-1919 Sex: female Admission Date (Current Location): 02/20/2019  [facility] and [place] Number:  Herbalist and Address:  The . [facility] [place] [address], [place], [place] [address]      Provider Number: [id number]  Attending Physician Name and Address:  Alma Friendly, MD  Relative Name and Phone Number:  Bora Bost  [phone]    Current Level of Care: Hospital Recommended Level of Care: [place] Prior Approval Number:    Date Approved/Denied:   PASRR Number: [id number]  Discharge Plan: SNF    Current Diagnoses: Patient Active Problem List   Diagnosis Date Noted  . Malnutrition of moderate degree 02/25/2019  . SBO (small bowel obstruction) ([place]) 02/21/2019  . Hypertrophic cardiomyopathy ([place]) 01/04/2019  . Hypertensive heart disease 12/24/2016  . Chronic diastolic heart failure ([place]) 09/11/2015  . DOE (dyspnea on exertion) 09/11/2015  . Dyspnea   . Encounter for palliative care   . Acute diastolic heart failure secondary to hypertrophic cardiomyopathy ([place]) 03/06/2015  . Mild tricuspid regurgitation 03/06/2015  . Moderate mitral regurgitation 03/06/2015  . Acute on chronic hyponatremia 03/06/2015  . Hypothyroidism 03/06/2015  . HTN (hypertension) 03/06/2015  . Anemia of chronic disease 03/06/2015  . CKD (chronic kidney disease), stage III ([place]) 03/06/2015  . Moderate aortic stenosis 03/06/2015  . Acute respiratory failure with hypoxia ([place]) 03/06/2015    Orientation RESPIRATION BLADDER Height & Weight     Self, Time, Situation, Place  Normal Continent, Incontinent Weight: 44.1 kg Height:  5' (152.4 cm)  BEHAVIORAL SYMPTOMS/MOOD NEUROLOGICAL BOWEL NUTRITION STATUS      Continent, Incontinent Diet(dysphagia II - nectar thick liquids)  AMBULATORY STATUS COMMUNICATION OF NEEDS Skin   Limited  Assist Verbally Surgical wounds                       Personal Care Assistance Level of Assistance  Bathing, Feeding, Dressing Bathing Assistance: Limited assistance Feeding assistance: Limited assistance Dressing Assistance: Limited assistance     Functional Limitations Info  Sight, Hearing, Speech Sight Info: Adequate Hearing Info: Adequate Speech Info: Adequate    SPECIAL CARE FACTORS FREQUENCY                       Contractures      Additional Factors Info  Code Status, Allergies Code Status Info: DNR Allergies Info: norvasc, alendronate           Current Medications (03/08/2019):  This is the current hospital active medication list Current Facility-Administered Medications  Medication Dose Route Frequency Provider Last Rate Last Dose  . 0.9 %  sodium chloride infusion   Intravenous PRN Ralene Ok, MD 10 mL/hr at 03/06/19 2137 1,000 mL at 03/06/19 2137  . acetaminophen (TYLENOL) tablet 650 mg  650 mg Oral Q6H PRN Ralene Ok, MD       Or  . acetaminophen (TYLENOL) suppository 650 mg  650 mg Rectal Q6H PRN Ralene Ok, MD      . carvedilol (COREG) tablet 6.25 mg  6.25 mg Oral BID WC Alma Friendly, MD   6.25 mg at 03/08/19 0844  . enoxaparin (LOVENOX) injection 30 mg  30 mg Subcutaneous Q24H Ralene Ok, MD   30 mg at 03/07/19 1359  . feeding supplement (ENSURE ENLIVE) (ENSURE ENLIVE) liquid 237 mL  237 mL Oral TID BM  Alma Friendly, MD   237 mL at 03/08/19 0844  . fentaNYL (SUBLIMAZE) injection 25 mcg  25 mcg Intravenous Q2H PRN Ralene Ok, MD   25 mcg at 02/28/19 2310  . furosemide (LASIX) tablet 20 mg  20 mg Oral Daily Alma Friendly, MD   20 mg at 03/08/19 0844  . hydrALAZINE (APRESOLINE) injection 10-20 mg  10-20 mg Intravenous Q4H PRN Ralene Ok, MD   20 mg at 03/03/19 1026  . insulin aspart (novoLOG) injection 0-9 Units  0-9 Units Subcutaneous TID WC Romona Curls, [facility]   2 Units at 03/08/19 4128  .  ipratropium-albuterol (DUONEB) 0.5-2.5 (3) MG/3ML nebulizer solution 3 mL  3 mL Nebulization Q6H PRN Ralene Ok, MD   3 mL at 02/26/19 1426  . levothyroxine (SYNTHROID) tablet 88 mcg  88 mcg Oral Daily Ralene Ok, MD   88 mcg at 03/08/19 0554  . MEDLINE mouth rinse  15 mL Mouth Rinse BID Ralene Ok, MD   15 mL at 03/08/19 0845  . menthol-cetylpyridinium (CEPACOL) lozenge 3 mg  1 lozenge Oral PRN Ralene Ok, MD   3 mg at 02/23/19 1139  . metoprolol tartrate (LOPRESSOR) injection 2.5 mg  2.5 mg Intravenous Q6H PRN Ralene Ok, MD      . morphine 2 MG/ML injection 1 mg  1 mg Intravenous Q3H PRN Ralene Ok, MD   1 mg at 03/03/19 2118  . multivitamin with minerals tablet 1 tablet  1 tablet Oral Daily Alma Friendly, MD   1 tablet at 03/08/19 0844  . ondansetron (ZOFRAN) tablet 4 mg  4 mg Oral Q6H PRN Ralene Ok, MD       Or  . ondansetron [facility]) injection 4 mg  4 mg Intravenous Q6H PRN Ralene Ok, MD   4 mg at 02/28/19 2310  . pantoprazole (PROTONIX) EC tablet 40 mg  40 mg Oral Daily Alma Friendly, MD   40 mg at 03/08/19 0844  . phenol (CHLORASEPTIC) mouth spray 1 spray  1 spray Mouth/Throat PRN Ralene Ok, MD   1 spray at 02/23/19 1138  . Resource ThickenUp Clear   Oral PRN Alma Friendly, MD      . sodium chloride flush (NS) 0.9 % injection 10-40 mL  10-40 mL Intracatheter PRN Ralene Ok, MD      . sodium chloride flush (NS) 0.9 % injection 3 mL  3 mL Intravenous Once Ralene Ok, MD         Discharge Medications: Please see discharge summary for a list of discharge medications.  Relevant Imaging Results:  Relevant Lab Results:   Additional Information SSN [id number]  Bartholomew Crews, RN

## 2019-03-09 ENCOUNTER — Inpatient Hospital Stay (HOSPITAL_COMMUNITY): Payer: Medicare HMO

## 2019-03-09 DIAGNOSIS — E440 Moderate protein-calorie malnutrition: Secondary | ICD-10-CM

## 2019-03-09 LAB — BASIC METABOLIC PANEL
Anion gap: 8 (ref 5–15)
BUN: 25 mg/dL — ABNORMAL HIGH (ref 8–23)
CO2: 27 mmol/L (ref 22–32)
Calcium: 7.9 mg/dL — ABNORMAL LOW (ref 8.9–10.3)
Chloride: 107 mmol/L (ref 98–111)
Creatinine, Ser: 1.08 mg/dL — ABNORMAL HIGH (ref 0.44–1.00)
GFR calc Af Amer: 49 mL/min — ABNORMAL LOW (ref >60)
GFR calc non Af Amer: 43 mL/min — ABNORMAL LOW (ref >60)
Glucose, Bld: 131 mg/dL — ABNORMAL HIGH (ref 70–99)
Potassium: 3.7 mmol/L (ref 3.5–5.1)
Sodium: 142 mmol/L (ref 135–145)

## 2019-03-09 LAB — GLUCOSE, CAPILLARY
Glucose-Capillary: 114 mg/dL — ABNORMAL HIGH (ref 70–99)
Glucose-Capillary: 153 mg/dL — ABNORMAL HIGH (ref 70–99)
Glucose-Capillary: 125 mg/dL — ABNORMAL HIGH (ref 70–99)
Glucose-Capillary: 96 mg/dL (ref 70–99)

## 2019-03-09 LAB — CBC WITH DIFFERENTIAL/PLATELET
Abs Immature Granulocytes: 0.06 10*3/uL (ref 0.00–0.07)
Basophils Absolute: 0 10*3/uL (ref 0.0–0.1)
Basophils Relative: 0 %
Eosinophils Absolute: 0.4 10*3/uL (ref 0.0–0.5)
Eosinophils Relative: 3 %
HCT: 34.6 % — ABNORMAL LOW (ref 36.0–46.0)
Hemoglobin: 11 g/dL — ABNORMAL LOW (ref 12.0–15.0)
Immature Granulocytes: 1 %
Lymphocytes Relative: 12 %
Lymphs Abs: 1.6 10*3/uL (ref 0.7–4.0)
MCH: 32.2 pg (ref 26.0–34.0)
MCHC: 31.8 g/dL (ref 30.0–36.0)
MCV: 101.2 fL — ABNORMAL HIGH (ref 80.0–100.0)
Monocytes Absolute: 0.6 10*3/uL (ref 0.1–1.0)
Monocytes Relative: 5 %
Neutro Abs: 10.3 10*3/uL — ABNORMAL HIGH (ref 1.7–7.7)
Neutrophils Relative %: 79 %
Platelets: 417 10*3/uL — ABNORMAL HIGH (ref 150–400)
RBC: 3.42 MIL/uL — ABNORMAL LOW (ref 3.87–5.11)
RDW: 14.3 % (ref 11.5–15.5)
WBC: 12.9 10*3/uL — ABNORMAL HIGH (ref 4.0–10.5)
nRBC: 0 % (ref 0.0–0.2)

## 2019-03-09 MED ORDER — PHENYLEPHRINE HCL-NACL 10-0.9 MG/250ML-% IV SOLN
INTRAVENOUS | Status: AC
  Filled 2019-03-09: qty 750

## 2019-03-09 NOTE — Progress Notes (Signed)
SLP Cancellation Note  Patient Details Name: BRONDA ALFRED MRN: [id number] DOB: 05/13/1919   Cancelled treatment:       Reason Eval/Treat Not Completed: Patient at procedure or test/unavailable(Having foley care. Per CNA, patient SOB this am impacting po). Did not consume much of am meal.   Gabriel Rainwater MA, CCC-SLP     Nychelle Cassata Meryl 03/09/2019, 9:46 AM

## 2019-03-09 NOTE — Progress Notes (Signed)
PT Cancellation Note  Patient Details Name: Tina Weaver MRN: [id number] DOB: [birth year]   Cancelled Treatment:    Reason Eval/Treat Not Completed: Fatigue/lethargy limiting ability to participate. On O2 and limited responsiveness.  Will reattempt at another time.   Ramond Dial 03/09/2019, 11:58 AM   Mee Hives, PT MS Acute Rehab Dept. Number: [place] and [place]

## 2019-03-09 NOTE — Progress Notes (Signed)
PROGRESS NOTE    Tina Weaver  QPY:[id number] DOB: 23-Dec-1918 DOA: 02/20/2019 PCP: Lajean Manes, MD    Brief Narrative:  85 year old female who presented with abdominal pain.  She does have significant past medical history for hypertension, chronic diastolic heart failure and aortic stenosis.  Patient reported 3 days of abdominal pain, associated with nausea and generalized weakness.  On her initial physical examination blood pressure 189/66, pulse rate 71, respiratory rate 22, oxygen saturation 96%, she had moist mucous membranes, her lungs are clear to auscultation bilaterally, heart S1-S2 present rhythmic, her abdomen was diffusely tender to palpation, moderate distention, no rebound no guarding, no lower extremity edema. CT of the abdomen showed a high-grade partial or complete small bowel obstruction with transition point in the posterior mid abdomen.  Patient was admitted to the hospital with the working diagnosis of small bowel obstruction.  Patient was initially medically treated, with intravenous fluids, bowel rest, analgesics and bowel decompression per nasogastric tube.  Repeat imaging showed worsening small bowel loops distention, decision was made to proceed with surgical intervention.  Patient underwent diagnostic laparoscopy with lysis of adhesions August 3.  Postoperatively she developed an ileus, diastolic heart failure decompensation and possible aspiration pneumonia.  Assessment & Plan:   Principal Problem:   SBO (small bowel obstruction) (HCC) Active Problems:   HTN (hypertension)   CKD (chronic kidney disease), stage III (HCC)   Chronic diastolic heart failure (HCC)   Malnutrition of moderate degree   1. Small bowel obstruction sp laparoscopic diagnostic lysis of adhesions 08/03. Patient is tolerating po well, but continue to be very weak and deconditioned. Will continue supportive medical therapy, add incentive spirometer as tolerated. Chest film personally  reviewed noted atelectasis at bases.   2. Post operative ileus. Slowly recovering, no nausea or vomiting.   3. Acute on chronic diastolic heart failure. Now patient is more euvolemic, will continue close volume monitoring.; Continue with carvedilol. Will hold on furosemide for now.   4. Aspiration pneumonia not present on admission. Clinically resolved, chest film today with no pneumonic infiltrates.   5. CKD stage 3. Stable renal function as per 08/10, will check renal function in am, patient very weak and deconditioned.   6. Moderate malnutrition. Continue with nutritional supplements.   7. T2Dm. Continue glucose cover and monitoring with insulin sliding scale. Fasting glucose is 90 today.   8. Hypothyroid. Continue levothyroxine.   DVT prophylaxis: enoxaparin   Code Status: full Family Communication: no family at the bedside  Disposition Plan/ discharge barriers: pending snf placement.   Body mass index is 18.99 kg/m. Malnutrition Type:  Nutrition Problem: Moderate Malnutrition Etiology: chronic illness(CHF)   Malnutrition Characteristics:  Signs/Symptoms: mild fat depletion, moderate fat depletion, mild muscle depletion, moderate muscle depletion   Nutrition Interventions:  Interventions: Ensure Enlive (each supplement provides 350kcal and 20 grams of protein), Magic cup, MVI, Hormel Shake  RN Pressure Injury Documentation:     Consultants:   Surgery   Procedures:   Laparoscopy with lysis of adhesions.   Antimicrobials:       Subjective: Patient this am with positive dyspnea and less reactive, increase work of breathing, positive abdominal distention, most information from nursing.   Objective: Vitals:   03/08/19 0833 03/08/19 1642 03/08/19 2056 03/09/19 0442  BP: (!) 167/60 (!) 149/71 (!) 152/56 (!) 152/46  Pulse: 74 79 69 62  Resp: 18 18 18 18   Temp:  98.6 F (37 C) 98.2 F (36.8 C) 98 F (36.7 C)  TempSrc:  Oral Oral   SpO2: 97% 95% 96% 94%   Weight:      Height:        Intake/Output Summary (Last 24 hours) at 03/09/2019 0929 Last data filed at 03/09/2019 0601 Gross per 24 hour  Intake 488.47 ml  Output 950 ml  Net -461.53 ml   Filed Weights   02/25/19 0300 03/03/19 2218 03/04/19 2020  Weight: 45 kg 44.3 kg 44.1 kg    Examination:   General: deconditioned, ill looking appearing.  Neurology: somnolent but responds to voice and touch, follows commands,  E ENT: positive pallor, no icterus, oral mucosa moist Cardiovascular: No JVD. S1-S2 present, rhythmic, no gallops, rubs, 3/6 systolic 3/6 systolic murmur at the base. No lower extremity edema. Pulmonary: positive breath sounds bilaterally, adequate air movement, no wheezing, rhonchi or rales. Gastrointestinal. Abdomen distended with no organomegaly, non tender, no rebound or guarding Skin. No rashes Musculoskeletal: no joint deformities     Data Reviewed: I have personally reviewed following labs and imaging studies  CBC: Recent Labs  Lab 03/03/19 0449 03/04/19 0320 03/05/19 0420 03/06/19 0409 03/07/19 0413  WBC 13.3* 13.1* 10.9* 11.1* 8.1  NEUTROABS 11.5* 10.9* 8.8* 8.9* 5.7  HGB 11.6* 9.9* 10.2* 10.3* 9.1*  HCT 36.2 30.2* 32.0* 31.9* 28.1*  MCV 102.5* 101.3* 102.6* 101.3* 100.4*  PLT 177 164 197 229 175   Basic Metabolic Panel: Recent Labs  Lab 03/03/19 0449 03/05/19 0420 03/06/19 0409 03/07/19 0413  NA 144 141 140 138  K 4.0 3.7 3.5 3.5  CL 111 110 108 106  CO2 24 22 21* 23  GLUCOSE 157* 128* 111* 90  BUN 37* 38* 33* 26*  CREATININE 0.97 1.06* 1.02* 1.02*  CALCIUM 8.2* 7.8* 8.0* 7.3*  MG 1.9  --   --   --   PHOS 3.2  --   --   --    GFR: Estimated Creatinine Clearance: 21.4 mL/min (A) (by C-G formula based on SCr of 1.02 mg/dL (H)). Liver Function Tests: Recent Labs  Lab 03/03/19 0449  AST 34  ALT 20  ALKPHOS 78  BILITOT 0.4  PROT 5.7*  ALBUMIN 2.1*   No results for input(s): LIPASE, AMYLASE in the last 168 hours. No results  for input(s): AMMONIA in the last 168 hours. Coagulation Profile: No results for input(s): INR, PROTIME in the last 168 hours. Cardiac Enzymes: No results for input(s): CKTOTAL, CKMB, CKMBINDEX, TROPONINI in the last 168 hours. BNP (last 3 results) No results for input(s): PROBNP in the last 8760 hours. HbA1C: No results for input(s): HGBA1C in the last 72 hours. CBG: Recent Labs  Lab 03/08/19 0702 03/08/19 1121 03/08/19 1640 03/08/19 2055 03/09/19 0659  GLUCAP 178* 89 126* 122* 114*   Lipid Profile: No results for input(s): CHOL, HDL, LDLCALC, TRIG, CHOLHDL, LDLDIRECT in the last 72 hours. Thyroid Function Tests: No results for input(s): TSH, T4TOTAL, FREET4, T3FREE, THYROIDAB in the last 72 hours. Anemia Panel: No results for input(s): VITAMINB12, FOLATE, FERRITIN, TIBC, IRON, RETICCTPCT in the last 72 hours.    Radiology Studies: I have reviewed all of the imaging during this hospital visit personally     Scheduled Meds: . carvedilol  6.25 mg Oral BID WC  . enoxaparin (LOVENOX) injection  30 mg Subcutaneous Q24H  . feeding supplement (ENSURE ENLIVE)  237 mL Oral TID BM  . furosemide  20 mg Oral Daily  . insulin aspart  0-9 Units Subcutaneous TID WC  . levothyroxine  88 mcg Oral Daily  .  mouth rinse  15 mL Mouth Rinse BID  . multivitamin with minerals  1 tablet Oral Daily  . pantoprazole  40 mg Oral Daily  . sodium chloride flush  3 mL Intravenous Once   Continuous Infusions: . sodium chloride 1,000 mL (03/06/19 2137)     LOS: 16 days        Athanasia Stanwood Gerome Apley, MD

## 2019-03-09 NOTE — Clinical Social Work Note (Signed)
CSW followed up with admissions director Bryson Ha at [facility]. She has not received auth on patient yet, and will contact MD if she does hear something today. CSW will continue to follow and facilitate discharge to SNF once auth received.  Bijal Siglin Givens, MSW, LCSW Licensed Clinical Social Worker [place] [phone]

## 2019-03-09 NOTE — Plan of Care (Signed)
?  Problem: Clinical Measurements: ?Goal: Will remain free from infection ?Outcome: Progressing ?  ?

## 2019-03-09 NOTE — Progress Notes (Signed)
Occupational Therapy Treatment Patient Details Name: Tina Weaver MRN: [id number] DOB: 1918-12-17 Today's Date: 03/09/2019    History of present illness Pt is a 85 y/o female admitted secondary to increased abdominal pain. Found to have SBO and had NG tube placed; now s/p laparoscopy and lysis of adhesions on 8/3; . PMH includes HTN, cardiomyopathy, d HF, and aortic stenosis.    OT comments  Pt overall progressing somewhat towards acute OT goals. Limited by lethargy this session. Min - mod A to complete simulated toilet transfer (EOB to recliner). Eyes mostly closed this session. A bit more alert and minimally conversing once up in chair. D/c plan to SNF remains appropriate.    Follow Up Recommendations  SNF;Supervision/Assistance - 24 hour   Equipment Recommendations  3 in 1 bedside commode;Other (comment)    Recommendations for Other Services      Precautions / Restrictions Precautions Precautions: Fall Precaution Comments: watch RR Restrictions Weight Bearing Restrictions: No       Mobility Bed Mobility Overal bed mobility: Needs Assistance Bed Mobility: Supine to Sit     Supine to sit: Min assist     General bed mobility comments: assisit to powerup, cues for initiation and sequencing  Transfers Overall transfer level: Needs assistance Equipment used: Rolling walker (2 wheeled) Transfers: Sit to/from [facility] Sit to Stand: Min assist Stand pivot transfers: Min assist       General transfer comment: min - mod A to power up, steady and control descent. Pt sitting down before positioned fully in front of chair (fatigue?).    Balance Overall balance assessment: Needs assistance Sitting-balance support: Bilateral upper extremity supported;Feet supported Sitting balance-Leahy Scale: Fair Sitting balance - Comments: sitting in signifianct trunk flexed position. elbows resting on knees, face in hands. Poor trunk control.   Standing balance  support: Bilateral upper extremity supported;During functional activity Standing balance-Leahy Scale: Poor Standing balance comment: Reliant on BUE support                            ADL either performed or assessed with clinical judgement   ADL Overall ADL's : Needs assistance/impaired                         Toilet Transfer: Minimal assistance;Moderate assistance;Stand-pivot Toilet Transfer Details (indicate cue type and reason): simulated with EOB>recliner Toileting- Clothing Manipulation and Hygiene: Moderate assistance;Sit to/from stand Toileting - Clothing Manipulation Details (indicate cue type and reason): stool noted on bed pad, pt seemed unaware. therapist completed pericare with pt in standing position with rw     Functional mobility during ADLs: Minimal assistance;Rolling walker(pivotal steps) General ADL Comments: Lethargy limiting session.       Vision       Perception     Praxis      Cognition Arousal/Alertness: Lethargic Behavior During Therapy: WFL for tasks assessed/performed;Flat affect Overall Cognitive Status: Difficult to assess                                 General Comments: Lethargic with eyes closed more often than open. Minimal verbalizations. Following one step commands.        Exercises     Shoulder Instructions       General Comments      Pertinent Vitals/ Pain       Pain Assessment: Faces Faces Pain  Scale: Hurts a little bit Pain Location: likely abdomen Pain Intervention(s): Monitored during session  Home Living                                          Prior Functioning/Environment              Frequency  Min 2X/week        Progress Toward Goals  OT Goals(current goals can now be found in the care plan section)  Progress towards OT goals: Progressing toward goals  Acute Rehab OT Goals Patient Stated Goal: to go home OT Goal Formulation: With patient Time  For Goal Achievement: 03/10/19 Potential to Achieve Goals: Good ADL Goals Pt Will Perform Grooming: with modified independence;standing Pt Will Perform Upper Body Bathing: with modified independence;sitting;standing Pt Will Perform Lower Body Bathing: with modified independence;sitting/lateral leans;sit to/from stand Pt Will Perform Upper Body Dressing: with modified independence;sitting;standing Pt Will Perform Lower Body Dressing: with modified independence;sitting/lateral leans;sit to/from stand Pt Will Transfer to Toilet: with modified independence;ambulating;bedside commode Pt Will Perform Tub/Shower Transfer: with modified independence;3 in 1;rolling walker;ambulating Additional ADL Goal #1: Pt will recall and/or apply 1-3 ECS strategies to ADL/IADL tasks as necessary for successful completion  Plan Discharge plan remains appropriate    Co-evaluation                 AM-PAC OT "6 Clicks" Daily Activity     Outcome Measure   Help from another person eating meals?: A Lot Help from another person taking care of personal grooming?: A Little Help from another person toileting, which includes using toliet, bedpan, or urinal?: A Little Help from another person bathing (including washing, rinsing, drying)?: A Lot Help from another person to put on and taking off regular upper body clothing?: A Little Help from another person to put on and taking off regular lower body clothing?: A Little 6 Click Score: 16    End of Session Equipment Utilized During Treatment: Gait belt;Rolling walker  OT Visit Diagnosis: Muscle weakness (generalized) (M62.81);Other abnormalities of gait and mobility (R26.89)   Activity Tolerance Patient limited by lethargy   Patient Left in chair;with call bell/phone within reach;with chair alarm set   Nurse Communication          Time: 1173-5670 OT Time Calculation (min): 22 min  Charges: OT General Charges $OT Visit: 1 Visit OT Treatments $Self  Care/Home Management : 8-22 mins  Tyrone Schimke, OT Acute Rehabilitation Services Pager: [phone] Office: [phone]    Hortencia Pilar 03/09/2019, 12:22 PM

## 2019-03-10 LAB — GLUCOSE, CAPILLARY
Glucose-Capillary: 110 mg/dL — ABNORMAL HIGH (ref 70–99)
Glucose-Capillary: 133 mg/dL — ABNORMAL HIGH (ref 70–99)
Glucose-Capillary: 146 mg/dL — ABNORMAL HIGH (ref 70–99)
Glucose-Capillary: 154 mg/dL — ABNORMAL HIGH (ref 70–99)

## 2019-03-10 LAB — SARS CORONAVIRUS 2 (TAT 6-24 HRS): SARS Coronavirus 2: NEGATIVE

## 2019-03-10 MED ORDER — FUROSEMIDE 20 MG PO TABS: ORAL_TABLET | ORAL | 0 refills | Status: AC

## 2019-03-10 MED ORDER — ENSURE ENLIVE PO LIQD: 237.0000 mL | Freq: Three times a day (TID) | ORAL | 0 refills | Status: AC

## 2019-03-10 MED ORDER — ACETAMINOPHEN 325 MG PO TABS: 650.0000 mg | ORAL_TABLET | Freq: Four times a day (QID) | ORAL | 0 refills | Status: AC | PRN

## 2019-03-10 MED ORDER — ADULT MULTIVITAMIN W/MINERALS CH: 1.0000 | ORAL_TABLET | Freq: Every day | ORAL | 0 refills | Status: AC

## 2019-03-10 MED ORDER — CARVEDILOL 6.25 MG PO TABS: 6.2500 mg | ORAL_TABLET | Freq: Two times a day (BID) | ORAL | 0 refills | Status: AC

## 2019-03-10 NOTE — Progress Notes (Signed)
Patient didn't not urinate after removal of foley, bladder scan done which showed 184 ml.  Notified Dr. Cathlean Sauer.

## 2019-03-10 NOTE — Progress Notes (Signed)
PT Cancellation Note  Patient Details Name: Tina Weaver MRN: [id number] DOB: [birth year]   Cancelled Treatment:    Reason Eval/Treat Not Completed: Fatigue/lethargy limiting ability to participate.  Declined PT again and will reattempt tomorrow.   Ramond Dial 03/10/2019, 3:15 PM   Mee Hives, PT MS Acute Rehab Dept. Number: [place] and [place]

## 2019-03-10 NOTE — Progress Notes (Signed)
  Speech Language Pathology Treatment: Dysphagia  Patient Details Name: Tina Weaver MRN: [id number] DOB: 04/11/1919 Today's Date: 03/10/2019 Time: 9038-3338 SLP Time Calculation (min) (ACUTE ONLY): 23 min  Assessment / Plan / Recommendation Clinical Impression  Patient competing lunch when entering room. CNA reports pt tolerating well with no clearing of throat or coughing. Patient's voice has a clear quality. She reports tolerating mechanical soft consistencies well. Plan is for pt to discharge today.     HPI HPI: Tina Weaver is a 85 y.o. female with medical history significant of HTN, chronic diastolic CHF with hypertensive cardiomyopathy, AS. Patient presented to the ED on 7/27 with c/o abd pain for 3 days associated with nausea, generalized weakness. CT of the abdomen and pelvis demonstrated a high grade SBO. Patient ultimately underwent diagnostic laparoscopy with lysis of adhesions on 8/3. Noted to have sudden tachypnea on 8/6, although saturating well on room air Afebrile, with leukocytosis. MD concerned about aspiration. CXR shows possible RUL infiltrate.      SLP Plan  Continue with current plan of care       Recommendations  Diet recommendations: Dysphagia 3 (mechanical soft);Nectar-thick liquid Liquids provided via: Cup Medication Administration: Whole meds with puree Supervision: Staff to assist with self feeding Compensations: Slow rate;Small sips/bites;Follow solids with liquid Postural Changes and/or Swallow Maneuvers: Seated upright 90 degrees                Oral Care Recommendations: Oral care BID Follow up Recommendations: Skilled Nursing facility SLP Visit Diagnosis: Dysphagia, oropharyngeal phase (R13.12) Plan: Continue with current plan of care       [place], MA, CCC-SLP 03/10/2019 2:20 PM

## 2019-03-10 NOTE — Discharge Summary (Addendum)
Physician Discharge Summary  Tina Weaver VHQ:[id number] DOB: 04/18/1919 DOA: 02/20/2019  PCP: Tina Manes, MD  Admit date: 02/20/2019 Discharge date: 03/12/2019  Admitted From: Home  Disposition:  Residential hospice  Recommendations for Outpatient Follow-up and new medication changes:  1. Follow up with Tina Weaver in 7 days.  2. Continue with furosemide only as needed.   Late entry: Patient has remained very weak and deconditioned, very poor oral intake, she is in very high risk of continue physical deterioration.  I discussed this with her son over the phone, and he agrees to continue Tina Weaver's care with a focus of comfort and avoid suffering.  Will transfer her to a hospice facility with the instructions to continue her medical care with the main focus of avoiding suffering and improving quality of life, palliative care.  Home Health: na   Equipment/Devices: na    Discharge Condition: stable  CODE STATUS: dnr   Diet recommendation: dysphagia 3 diet with aspiration precautions.   Diet recommendations: Dysphagia 3 (mechanical soft);Nectar-thick liquid Liquids provided via: Cup Medication Administration: Whole meds with puree Supervision: Staff to assist with self feeding Compensations: Slow rate;Small sips/bites;Follow solids with liquid Postural Changes and/or Swallow Maneuvers: Seated upright 90 degrees       Brief/Interim Summary: 85 year old female who presented with abdominal pain.  She does have significant past medical history for hypertension, chronic diastolic heart failure and aortic stenosis.  Patient reported 3 days of abdominal pain, associated with nausea and generalized weakness.  On her initial physical examination her blood pressure was 189/66, pulse rate 71, respiratory rate 22, oxygen saturation 96%, she had moist mucous membranes, her lungs were clear to auscultation bilaterally, heart S1-S2 present and rhythmic, her abdomen was diffusely tender to  palpation, moderate distention, no rebound no guarding, no lower extremity edema. Sodium 135, potassium 4.5, chloride 101, bicarb 22, glucose 152, BUN 24, creatinine 1.0, white count 13.9, hemoglobin 15.0, hematocrit 44.0, platelets 253.  SARS COVID 19 was negative. CT of the abdomen showed a high-grade partial or complete small bowel obstruction with transition point in the posterior mid abdomen.  Patient was admitted to the hospital with the working diagnosis of small bowel obstruction.  Patient was initially medically treated, with intravenous fluids, bowel rest, analgesics and bowel decompression per nasogastric tube.  Repeat imaging showed worsening small bowel loops distention, decision was made to proceed with surgical intervention.  Patient underwent diagnostic laparoscopy with lysis of adhesions August 3.  Postoperatively she developed an ileus, diastolic heart failure decompensation and possible aspiration pneumonia/ pneumonitis.  1.  Small bowel obstruction status post laparoscopic diagnostic lysis of adhesions August 3.  Patient had a prolonged hospitalization, initially failed conservative management with bowel decompression with nasogastric tube, intravenous fluids, intravenous as needed analgesics and antiemetics.  Patient underwent surgery August 3 with lysis of adhesions laparoscopically.  She developed a postoperative ileus that was treated conservatively.  Her diet has been advanced progressively with good toleration.  Patient is very weak and deconditioned, patient was seen by physical therapy recommendations to continue care at a skilled nursing facility.  2.  Acute on chronic diastolic heart failure/ aortic stenosis with hypertrophic obstructive cardiomyopathy.  Patient received diuretic therapy with furosemide, beta blockade with carvedilol.  Her net fluid balance at discharge is -3918 mL. Currently she is euvolemic. Continue diuretic therapy with only as needed for: edema,  dyspnea or weight gain, 3 pounds in 24 hours or 5 pounds in 7 days.  3.  Aspiration pneumonia//pneumonitis,  not present on admission.  Patient was noted to have respiratory distress, her chest radiograph from August 6 showed a interstitial infiltrate at the right upper lobe, patient received a short course of antibiotic therapy with intravenous Unasyn, bronchodilator therapy and aspiration precautions.  She was seen by speech therapy with recommendations to dysphagia 3 diet.  4.  Chronic kidney disease stage III.  Kidney function has remained stable, with serum creatinie at 1.0, sodium 142, potassium 3.7, bicarbonate 27.  Continue to encourage p.o. intake.  5.  Moderate calorie protein malnutrition.  Patient received nutritional supplements.  6. Hypothyroid.  Continue with levothyroxine.   Discharge Diagnoses:  Principal Problem:   SBO (small bowel obstruction) (HCC) Active Problems:   HTN (hypertension)   CKD (chronic kidney disease), stage III (HCC)   Chronic diastolic heart failure (HCC)   Malnutrition of moderate degree    Discharge Instructions   Allergies as of 03/10/2019      Reactions   Norvasc [amlodipine] Swelling   Lower extremity edema with 10 mg dose, tolerates 5mg  without problems. Lower extremity edema with 10 mg dose, tolerates 5mg  without problems.   Alendronate Other (See Comments)   Pain to ext's   Fosamax [alendronate Sodium] Other (See Comments)   "Pain to legs" per pt's family member      Medication List    STOP taking these medications   potassium chloride SA 20 MEQ tablet Commonly known as: K-DUR     TAKE these medications   acetaminophen 325 MG tablet Commonly known as: TYLENOL Take 2 tablets (650 mg total) by mouth every 6 (six) hours as needed for mild pain (or Fever >/= 101).   aspirin 81 MG chewable tablet Chew 81 mg by mouth daily.   carvedilol 6.25 MG tablet Commonly known as: COREG Take 1 tablet (6.25 mg total) by mouth 2 (two) times  daily with a meal. What changed:   medication strength  how much to take  when to take this   feeding supplement (ENSURE ENLIVE) Liqd Take 237 mLs by mouth 3 (three) times daily between meals.   furosemide 20 MG tablet Commonly known as: Lasix Take one tablet daily in case of edema, shortness of breath or weight gain three lbs in 24 hours or five lbs in 7 days. What changed:   how much to take  how to take this  when to take this  additional instructions   levothyroxine 88 MCG tablet Commonly known as: SYNTHROID Take 88 mcg by mouth daily.   multivitamin with minerals Tabs tablet Take 1 tablet by mouth daily. Start taking on: March 11, 2019   rosuvastatin 5 MG tablet Commonly known as: CRESTOR Take 1 tablet (5 mg total) by mouth daily at 6 PM. Please keep upcoming appt for future refills. Thank you            Durable Medical Equipment  (From admission, onward)         Start     Ordered   03/04/19 1229  For home use only DME 3 n 1  Once     03/04/19 1229          Contact information for follow-up providers    Imogene Burn, PA-C Follow up.   Specialty: Cardiology Why: [facility] - you will have a virtual visit on 03/23/19 at 1pm via phone with Ermalinda Barrios on this day. Please be available to receive call 15 minutes beforehand. See end of AVS for instructions. Contact information:  [place] [address] [phone]        [facility], [place]. Go on 03/22/2019.   Specialty: General Surgery Why: Your appointment is 8/25 @ 9:45am. Please arrive 30 minutes prior to your appointment to check in and fill out paperwork. Bring photo ID and insurance information. Contact information: [address] [place] [phone]           Contact information for after-discharge care    Destination    [facility] .   Service: Skilled  Nursing Contact information: [address] [address] [phone]                 Allergies  Allergen Reactions  . Norvasc [Amlodipine] Swelling    Lower extremity edema with 10 mg dose, tolerates 5mg  without problems. Lower extremity edema with 10 mg dose, tolerates 5mg  without problems.  . Alendronate Other (See Comments)    Pain to ext's  . Fosamax [Alendronate Sodium] Other (See Comments)    "Pain to legs" per pt's family member    Consultations:  Surgery  Cardiology     Procedures/Studies: Dg Chest 1 View  Result Date: 03/09/2019 CLINICAL DATA:  Dyspnea EXAM: CHEST  1 VIEW COMPARISON:  03/03/2019 FINDINGS: Cardiac shadow is stable. Left-sided PICC line is again noted and stable. Gastric catheter has been removed. Lungs are well aerated bilaterally. Improved aeration in the right upper lobe is noted when compared with the prior study. No bony abnormality is seen. IMPRESSION: Improved aeration when compare with the prior exam. Electronically Signed   By: Inez Catalina M.D.   On: 03/09/2019 12:36   Dg Abd 1 View  Result Date: 02/21/2019 CLINICAL DATA:  10 FR NASO GASTRIC SUMP TUBE PLACED UNDER FLUORO AFTER MULTIPLE ATTEMPTS ON FLOOR(NO RAD) 3 MIN 36 SEC FLUORO EXAM: ABDOMEN - 1 VIEW COMPARISON:  CT of the abdomen and pelvis on 02/21/2019 FINDINGS: Nasogastric tube has been placed, tip overlying the level of the distal stomach. Persistent dilatation of small bowel loops. No free intraperitoneal air. IMPRESSION: Nasogastric tube tip to level of distal stomach. Persistent dilatation of small bowel loops. Electronically Signed   By: Nolon Nations M.D.   On: 02/21/2019 09:35   Ct Angio Chest Pe W Or Wo Contrast  Result Date: 03/04/2019 CLINICAL DATA:  Intermediate probability for pulmonary embolus. PE suspected. Shortness of breath. Positive D-dimer. EXAM: CT ANGIOGRAPHY CHEST WITH CONTRAST TECHNIQUE: Multidetector CT imaging of the chest was  performed using the standard protocol during bolus administration of intravenous contrast. Multiplanar CT image reconstructions and MIPs were obtained to evaluate the vascular anatomy. CONTRAST:  26mL OMNIPAQUE IOHEXOL 350 MG/ML SOLN COMPARISON:  Chest x-ray March 03, 2019 FINDINGS: Cardiovascular: Cardiomegaly is identified. Coronary artery calcifications are noted. Atherosclerotic change is seen throughout the thoracic aorta. No dissection or aneurysm is identified. Evaluation of peripheral pulmonary arteries in the bases is limited due to respiratory motion. Within this limitation, no pulmonary emboli identified. Mediastinum/Nodes: There appears to be a small amount of fluid along the length of the esophagus. The esophagus is otherwise normal. No adenopathy. No effusions. Lungs/Pleura: Central airways are normal. No pneumothorax. Platelike opacity in the right base is consistent with atelectasis. No suspicious infiltrates. A calcified granuloma is seen in the superior segment of the left lower lobe. No suspicious nodules or masses are seen in either lung. Upper Abdomen: No acute abnormality. Musculoskeletal: A compression  fracture of a midthoracic vertebral body is unchanged compared to the chest x-ray from May 15, 2015. Review of the MIP images confirms the above findings. IMPRESSION: 1. No pulmonary emboli. 2. Cardiomegaly. 3. Coronary artery calcifications. 4. Atherosclerotic change in the thoracic aorta without aneurysm. 5. There appears to be a small amount of fluid in the esophagus, possibly due to reflux. 6. Atelectasis in the right base. 7. Chronic compression fracture of a midthoracic vertebral body. Aortic Atherosclerosis (ICD10-I70.0). Electronically Signed   By: Dorise Bullion III M.D   On: 03/04/2019 20:24   Ct Abdomen Pelvis W Contrast  Result Date: 02/21/2019 CLINICAL DATA:  Nausea, abdominal pain, weakness EXAM: CT ABDOMEN AND PELVIS WITH CONTRAST TECHNIQUE: Multidetector CT imaging of  the abdomen and pelvis was performed using the standard protocol following bolus administration of intravenous contrast. CONTRAST:  42mL OMNIPAQUE IOHEXOL 300 MG/ML  SOLN COMPARISON:  01/30/2016 FINDINGS: Lower chest: Trace right pleural effusion. Hepatobiliary: Liver is within normal limits. Gallbladder is unremarkable. No intrahepatic or extrahepatic ductal dilatation. Pancreas: Within normal limits. Spleen: Within normal limits. Adrenals/Urinary Tract: Adrenal glands are within normal limits. Kidneys are within normal limits.  No hydronephrosis. Bladder is within normal limits. Stomach/Bowel: Stomach is within normal limits. Multiple dilated loops of small bowel in the lower abdomen, with abrupt transition point in the posterior mid abdomen (series 3/image 45; coronal image 54). Decompressed loops of ileum in the right mid/lower abdomen (series 3/image 38). This appearance is compatible with high-grade partial or complete small bowel obstruction. Appendix is not discretely visualized. Left colon is decompressed. Vascular/Lymphatic: No evidence of abdominal aortic aneurysm. Atherosclerotic calcifications of the abdominal aorta and branch vessels. No suspicious abdominopelvic lymphadenopathy. Reproductive: Status post hysterectomy. Bilateral ovaries are unremarkable. Other: Small volume abdominopelvic ascites. No free air. Musculoskeletal: Degenerative changes of the visualized thoracolumbar spine. IMPRESSION: High-grade partial or complete small bowel obstruction, with transition point in the posterior mid abdomen, as above. Small volume abdominopelvic ascites.  No free air. Trace right pleural effusion. Electronically Signed   By: Julian Hy M.D.   On: 02/21/2019 02:03   Dg Chest Port 1 View  Result Date: 03/03/2019 CLINICAL DATA:  Leukocytosis and short of breath EXAM: PORTABLE CHEST 1 VIEW COMPARISON:  05/15/2015 FINDINGS: NG tube in the stomach. Left arm PICC tip in the SVC at the cavoatrial  junction. Elevated right hemidiaphragm with decreased lung volume compared to the prior study. Negative for edema or effusion. Possible right upper lobe airspace disease.  Follow-up recommended. IMPRESSION: Possible right upper lobe infiltrate. Follow-up chest x-ray recommended. Electronically Signed   By: Franchot Gallo M.D.   On: 03/03/2019 08:39   Dg Abd 2 Views  Result Date: 03/05/2019 CLINICAL DATA:  Recent small bowel obstruction. EXAM: ABDOMEN - 2 VIEW COMPARISON:  02/25/2019 FINDINGS: Barium is seen in the colon. No evidence of small bowel dilatation. No free air. IMPRESSION: Retained barium in the colon.  Resolved small bowel obstruction. Electronically Signed   By: Aletta Edouard M.D.   On: 03/05/2019 13:22   Dg Abd Portable 1v  Result Date: 02/25/2019 CLINICAL DATA:  Small-bowel obstruction. EXAM: PORTABLE ABDOMEN - 1 VIEW COMPARISON:  02/24/2019. CT 02/21/2019. FINDINGS: NG tube stable position. Persistent distention of small bowel loops without significant interim change. Findings consistent small bowel obstruction. No free air. Stable calcific density in the left lower pelvis consistent. No acute bony abnormality. IMPRESSION: 1.  NG tube stable position. 2. Persistent positions small-bowel loops without significant interim change. Findings consistent small  bowel obstruction. No free air. Electronically Signed   By: Marcello Moores  Register   On: 02/25/2019 05:56   Dg Abd Portable 1v  Result Date: 02/24/2019 CLINICAL DATA:  Small-bowel obstruction EXAM: PORTABLE ABDOMEN - 1 VIEW COMPARISON:  02/23/2019, 02/22/2019 FINDINGS: Enteric tube remains appropriately positioned within the stomach. Multiple dilated loops of small bowel persist within the abdomen measuring up to 4.7 cm in diameter, slightly increased in caliber compared to 02/23/2019, although not significantly changed compared to 02/22/2019. No gross free intraperitoneal air. Lung bases clear. IMPRESSION: Small-bowel obstruction with slight  interval increase in the caliber of dilated small bowel loops compared to most recent prior, although remains similar to appearance 02/22/2019. Electronically Signed   By: Davina Poke M.D.   On: 02/24/2019 08:50   Dg Abd Portable 1v  Result Date: 02/23/2019 CLINICAL DATA:  Small-bowel obstruction EXAM: PORTABLE ABDOMEN - 1 VIEW COMPARISON:  02/22/2019 FINDINGS: Gastric tube remains in the region of the gastric antrum unchanged. Dilated small bowel loops with mild interval improvement. Colon decompressed. IMPRESSION: Mild improvement in dilated small bowel loops compatible with improving small-bowel obstruction. Electronically Signed   By: Franchot Gallo M.D.   On: 02/23/2019 09:22   Dg Abd Portable 1v-small Bowel Obstruction Protocol-24 Hr Delay  Result Date: 02/22/2019 CLINICAL DATA:  Small bowel obstruction EXAM: PORTABLE ABDOMEN - 1 VIEW COMPARISON:  02/21/2019 FINDINGS: There is persistent dilated loops of small bowel in the abdomen to 4.6 cm. There is faint enteric contrast in the lower abdomen which may be within a dilated small bowel loop. There is a nasogastric tube with the tip projecting over the antrum of the stomach. There is no evidence of pneumoperitoneum, portal venous gas or pneumatosis. There are no pathologic calcifications along the expected course of the ureters. The osseous structures are unremarkable. IMPRESSION: 1. Persistent dilated loops of small bowel in the abdomen consistent with small bowel obstruction. Electronically Signed   By: Kathreen Devoid   On: 02/22/2019 14:20   Dg Abd Portable 1v-small Bowel Obstruction Protocol-initial, 8 Hr Delay  Result Date: 02/21/2019 CLINICAL DATA:  Small-bowel obstruction, 8 hour delay EXAM: PORTABLE ABDOMEN - 1 VIEW COMPARISON:  02/21/2019 FINDINGS: Esophageal tube tip overlies the gastroduodenal junction. Dilute contrast present within persistently dilated small bowel. Small bowel distension up to 4.2 cm. No definite contrast within the  colon. Excreted contrast within the urinary bladder. IMPRESSION: Dilute contrast present within persistently dilated small bowel, consistent with bowel obstruction. No definitive contrast observed within the colon. Electronically Signed   By: Donavan Foil M.D.   On: 02/21/2019 21:07   [place] Ekg Site Rite  Result Date: 02/25/2019 If Site Rite image not attached, placement could not be confirmed due to current cardiac rhythm.     Procedures: Laparoscopic lysis of adhesions.   Subjective: Patient is very weak and deconditioned, poor oral intake, no chest pain, no nausea or vomiting.   Discharge Exam: Vitals:   03/10/19 0355 03/10/19 0843  BP: (!) 152/53 (!) 145/41  Pulse: (!) 58 65  Resp: 14 (!) 26  Temp: (!) 97.5 F (36.4 C) 97.8 F (36.6 C)  SpO2: 100% 97%   Vitals:   03/09/19 1700 03/09/19 2045 03/10/19 0355 03/10/19 0843  BP: (!) 151/56 (!) 151/52 (!) 152/53 (!) 145/41  Pulse: 71 64 (!) 58 65  Resp: 20 16 14  (!) 26  Temp: 97.8 F (36.6 C) 97.7 F (36.5 C) (!) 97.5 F (36.4 C) 97.8 F (36.6 C)  TempSrc: Oral Oral Oral  Oral  SpO2: 100% 99% 100% 97%  Weight:   56 kg   Height:        General: deconditioned.  Neurology: Awake and alert, non focal  E ENT: mild pallor, no icterus, oral mucosa moist Cardiovascular: No JVD. S1-S2 present, rhythmic, no gallops, rubs, or murmurs. No lower extremity edema. Pulmonary: positive breath sounds bilaterally, decreased air movement, no wheezing, rhonchi or rales. Gastrointestinal. Abdomen with no organomegaly, non tender, no rebound or guarding Skin. No rashes Musculoskeletal: no joint deformities   The results of significant diagnostics from this hospitalization (including imaging, microbiology, ancillary and laboratory) are listed below for reference.     Microbiology: No results found for this or any previous visit (from the past 240 hour(s)).   Labs: BNP (last 3 results) No results for input(s): BNP in the last 8760  hours. Basic Metabolic Panel: Recent Labs  Lab 03/05/19 0420 03/06/19 0409 03/07/19 0413 03/09/19 1830  NA 141 140 138 142  K 3.7 3.5 3.5 3.7  CL 110 108 106 107  CO2 22 21* 23 27  GLUCOSE 128* 111* 90 131*  BUN 38* 33* 26* 25*  CREATININE 1.06* 1.02* 1.02* 1.08*  CALCIUM 7.8* 8.0* 7.3* 7.9*   Liver Function Tests: No results for input(s): AST, ALT, ALKPHOS, BILITOT, PROT, ALBUMIN in the last 168 hours. No results for input(s): LIPASE, AMYLASE in the last 168 hours. No results for input(s): AMMONIA in the last 168 hours. CBC: Recent Labs  Lab 03/04/19 0320 03/05/19 0420 03/06/19 0409 03/07/19 0413 03/09/19 1830  WBC 13.1* 10.9* 11.1* 8.1 12.9*  NEUTROABS 10.9* 8.8* 8.9* 5.7 10.3*  HGB 9.9* 10.2* 10.3* 9.1* 11.0*  HCT 30.2* 32.0* 31.9* 28.1* 34.6*  MCV 101.3* 102.6* 101.3* 100.4* 101.2*  PLT 164 197 229 251 417*   Cardiac Enzymes: No results for input(s): CKTOTAL, CKMB, CKMBINDEX, TROPONINI in the last 168 hours. BNP: Invalid input(s): POCBNP CBG: Recent Labs  Lab 03/09/19 0659 03/09/19 1121 03/09/19 1650 03/09/19 2046 03/10/19 0657  GLUCAP 114* 153* 125* 96 110*   D-Dimer No results for input(s): DDIMER in the last 72 hours. Hgb A1c No results for input(s): HGBA1C in the last 72 hours. Lipid Profile No results for input(s): CHOL, HDL, LDLCALC, TRIG, CHOLHDL, LDLDIRECT in the last 72 hours. Thyroid function studies No results for input(s): TSH, T4TOTAL, T3FREE, THYROIDAB in the last 72 hours.  Invalid input(s): FREET3 Anemia work up No results for input(s): VITAMINB12, FOLATE, FERRITIN, TIBC, IRON, RETICCTPCT in the last 72 hours. Urinalysis    Component Value Date/Time   COLORURINE YELLOW 05/15/2015 1745   APPEARANCEUR CLEAR 05/15/2015 1745   LABSPEC 1.011 05/15/2015 1745   PHURINE 6.5 05/15/2015 1745   GLUCOSEU NEGATIVE 05/15/2015 1745   HGBUR NEGATIVE 05/15/2015 1745   BILIRUBINUR NEGATIVE 05/15/2015 1745   KETONESUR NEGATIVE 05/15/2015 1745    PROTEINUR NEGATIVE 05/15/2015 1745   UROBILINOGEN 0.2 05/15/2015 1745   NITRITE NEGATIVE 05/15/2015 1745   LEUKOCYTESUR NEGATIVE 05/15/2015 1745   Sepsis Labs Invalid input(s): PROCALCITONIN,  WBC,  LACTICIDVEN Microbiology No results found for this or any previous visit (from the past 240 hour(s)).   Time coordinating discharge: 45 minutes  SIGNED:   Tawni Millers, MD  [facility] 03/10/2019, 10:35 AM

## 2019-03-10 NOTE — TOC Progression Note (Signed)
Transition of Care [facility]) - Progression Note    Patient Details  Name: Tina Weaver MRN: [id number] Date of Birth: January 19, 1919  Transition of [facility]) CM/SW Contact  Bartholomew Crews, RN Phone Number: [phone] 03/10/2019, 5:05 PM  Clinical Narrative:    Spoke with MD about patient readiness for transition d/t increase respirations and work of breathing. Patient denying pain and shortness of breath. Increased lethargy, but arouses easily to voice. MD will hold transition tonight. [employer] notified. CM to follow for transition of care needs.    Expected Discharge Plan: [place] Barriers to Discharge: Continued Medical Work up  Expected Discharge Plan and Services Expected Discharge Plan: [place] In-house Referral: [facility] Discharge Planning Services: CM Consult Post Acute Care Choice: Durable Medical Equipment, [place] arrangements for the past 2 months: Single Family Home Expected Discharge Date: 03/10/19               DME Arranged: 3-N-1 DME Agency: [employer] Date DME Agency Contacted: 03/02/19 Time DME Agency Contacted: 1222 Representative spoke with at DME Agency: [facility] HH Arranged: RN, PT, Nurse's Aide [place] Agency: [place] ([place]) Date [place]: 03/02/19 Time [place]: 1223 Representative spoke with at [place]: [place] ([place]) Interventions    Readmission Risk Interventions No flowsheet data found.

## 2019-03-10 NOTE — Plan of Care (Signed)
  Problem: Education: Goal: Knowledge of General Education information will improve Description: Including pain rating scale, medication(s)/side effects and non-pharmacologic comfort measures Outcome: Progressing   Problem: Clinical Measurements: Goal: Respiratory complications will improve Outcome: Progressing   

## 2019-03-11 LAB — GLUCOSE, CAPILLARY
Glucose-Capillary: 122 mg/dL — ABNORMAL HIGH (ref 70–99)
Glucose-Capillary: 144 mg/dL — ABNORMAL HIGH (ref 70–99)
Glucose-Capillary: 158 mg/dL — ABNORMAL HIGH (ref 70–99)
Glucose-Capillary: 172 mg/dL — ABNORMAL HIGH (ref 70–99)

## 2019-03-11 NOTE — TOC Progression Note (Signed)
Transition of Care [facility]) - Progression Note    Patient Details  Name: Tina Weaver MRN: [id number] Date of Birth: Jul 19, 1919  Transition of Care [facility]) CM/SW Contact  Bartholomew Crews, RN Phone Number: 03/11/2019, 3:04 PM  Clinical Narrative:    Received call from MD - DC order canceled pending hospice referral - spoke with Anderson Malta at [facility] - will f/u for hospice eligiblity. CM to follow for transition of care needs.    Expected Discharge Plan: [place] Barriers to Discharge: Continued Medical Work up  Expected Discharge Plan and Services Expected Discharge Plan: [place] In-house Referral: [facility] Discharge Planning Services: CM Consult Post Acute Care Choice: Durable Medical Equipment, [place] arrangements for the past 2 months: Single Family Home Expected Discharge Date: 03/11/19               DME Arranged: 3-N-1 DME Agency: [employer] Date DME Agency Contacted: 03/02/19 Time DME Agency Contacted: 1222 Representative spoke with at DME Agency: zach HH Arranged: RN, PT, Nurse's Aide [place] Agency: [place] ([place]) Date [place]: 03/02/19 Time [place]: 1223 Representative spoke with at [place]: [place]) Interventions    Readmission Risk Interventions No flowsheet data found.

## 2019-03-11 NOTE — Plan of Care (Signed)
  Problem: Nutrition: Goal: Adequate nutrition will be maintained Outcome: Progressing   Problem: Elimination: Goal: Will not experience complications related to bowel motility Outcome: Progressing   

## 2019-03-11 NOTE — Progress Notes (Signed)
Designer, jewellery Based Palliative Care  Referral received to follow Ms. Estala outpatient at [facility].    ACC will reach out to facility to make aware of referral for palliative.  Thank you, Venia Carbon RN, BSN, [place] Liaison (in [place]) [phone]

## 2019-03-11 NOTE — Progress Notes (Signed)
Manufacturing engineer [facility]) Hospital Liaison note.   Received request from Manya Silvas, [facility] for family interest in [facility] with request for transfer on 03/12/2019. Chart reviewed and eligibility confirmed. Spoke with son, Dorothyann Peng to confirm interest and explain services. Case manager aware. Registration paper work to be completed prior to transfer on 03/12/2019. Liaison will notify hospital with this is complete so transport can be arranged.  After notification of completed paperwork, please fax discharge summary to [phone]. RN please call report to [phone]. Please arrange transport for patient.    Thank you,      Farrel Gordon, RN, [facility]   [place]     [place] are on AMION

## 2019-03-11 NOTE — Progress Notes (Signed)
  Speech Language Pathology Treatment: Dysphagia  Patient Details Name: Tina Weaver MRN: [id number] DOB: August 05, 1918 Today's Date: 03/11/2019 Time: 7517-0017 SLP Time Calculation (min) (ACUTE ONLY): 37 min  Assessment / Plan / Recommendation Clinical Impression  Pt sleeping upon SLP arrival but woke to SLP stimulation.  Upon examination of oral cavity, she has copious food residuals with some appearing dry and adhering to her partials.  SLP requested pt remove her partial for cleaning but she was unable to - recommend continue efforts especially as oral care improves.  Per notes, pt's intake with breakfast was 100% and her voice is clear.    SLP provided oral care including use of toothbrush and oral suction, she also has torus palatanus and has dried secretions under this region.  Pt brushed her gums/teeth and then SLP followed up later as she has copious secretions and residual food.  No oral candidiasis apparent.  Pt reports she does have to be careful about cleaning around her tortus palatanus.    After oral care provided, pt was provided with single ice chip.  Delayed swallow noted - severely- with pt requiring verbal cue to swallow. No indication of aspiration.   She then winced and stated she is having pain at her abdomen.  Note WBC increasing to 12.9 = ? Source.  Recommend follow very strict aspiration precautions and oral care.      HPI HPI: Tina Weaver is a 85 y.o. female with medical history significant of HTN, chronic diastolic CHF with hypertensive cardiomyopathy, AS. Patient presented to the ED on 7/27 with c/o abd pain for 3 days associated with nausea, generalized weakness. CT of the abdomen and pelvis demonstrated a high grade SBO. Patient ultimately underwent diagnostic laparoscopy with lysis of adhesions on 8/3. Noted to have sudden tachypnea on 8/6, although saturating well on room air Afebrile, with leukocytosis. MD concerned about aspiration. CXR shows possible RUL infiltrate.      SLP Plan  Continue with current plan of care       Recommendations  Diet recommendations: Thin liquid Liquids provided via: Cup Medication Administration: Whole meds with puree Supervision: Staff to assist with self feeding Compensations: Slow rate;Small sips/bites;Follow solids with liquid Postural Changes and/or Swallow Maneuvers: Seated upright 90 degrees                Oral Care Recommendations: Oral care BID Follow up Recommendations: Skilled Nursing facility SLP Visit Diagnosis: Dysphagia, oropharyngeal phase (R13.12) Plan: Continue with current plan of care       GO                Macario Golds 03/11/2019, 11:09 AM  Luanna Salk, MS [place] SLP Acute Rehab Services Pager [phone] Office [phone]

## 2019-03-11 NOTE — Progress Notes (Signed)
Patient has remained stable but very weak and deconditioned, continue to have very poor oral intake, this morning she was noted to have urinary retention and Foley catheter had to be replaced.  I spoke with her son over the phone, patient has very poor prognosis, will continue her care at the skilled nursing facility with the focus of avoiding suffering and improving quality of life, palliative care.

## 2019-03-11 NOTE — Progress Notes (Addendum)
Palliative:  Consult received and chart reviewed. Patient discharging today. Will request palliative care see patient at SNF. Will place social work consult - discussed with [place] manager.  Thank you for this consult.  Juel Burrow, DNP, AGNP-C Palliative Medicine Team Team Phone # [phone]  Pager # [phone]  NO CHARGE

## 2019-03-11 NOTE — Progress Notes (Signed)
PT Cancellation Note  Patient Details Name: Tina Weaver MRN: [id number] DOB: Sep 04, 1918   Cancelled Treatment:    Reason Eval/Treat Not Completed: Other (comment).  Pt was with another staff member when PT came by and will reattempt at another time.   Ramond Dial 03/11/2019, 1:06 PM   Mee Hives, PT MS Acute Rehab Dept. Number: [place] and [place]

## 2019-03-12 LAB — GLUCOSE, CAPILLARY: Glucose-Capillary: 113 mg/dL — ABNORMAL HIGH (ref 70–99)

## 2019-03-12 NOTE — Progress Notes (Signed)
Ms. Sedivy has remained hemodynamically stable, she continues to be very weak and deconditioned, decision has been made to transfer patient to residential hospice, to continue her medical care.  Patient is medically stable for transfer today.

## 2019-03-12 NOTE — Progress Notes (Signed)
Patients MEWS score is 2. Patient is being D/C'd to hospice. MD aware.

## 2019-03-12 NOTE — Plan of Care (Signed)
  Problem: Nutrition: Goal: Adequate nutrition will be maintained Outcome: Progressing   Problem: Elimination: Goal: Will not experience complications related to bowel motility Outcome: Progressing   

## 2019-03-12 NOTE — Plan of Care (Signed)
  Problem: Education: Goal: Knowledge of General Education information will improve Description: Including pain rating scale, medication(s)/side effects and non-pharmacologic comfort measures Outcome: Not Progressing   

## 2019-03-12 NOTE — TOC Transition Note (Signed)
Transition of Care [facility]) - CM/SW Discharge Note   Patient Details  Name: Tina Weaver MRN: [id number] Date of Birth: Feb 16, 1919  Transition of Care [facility]) CM/SW Contact:  Gelene Mink, [place] Phone Number: 03/12/2019, 9:46 AM   Clinical Narrative:     Patient will DC to: [facility]  Anticipated DC date: 03/12/2019 Family notified: Yes Transport by: Corey Harold   Per MD patient ready for DC to . RN, patient, patient's family, and facility notified of DC. Discharge Summary sent to facility. RN to call report prior to discharge [phone]). DC packet on chart. Ambulance transport requested for patient.   CSW will sign off for now as social work intervention is no longer needed. Please consult [place] again if new needs arise.  Yaniv Lage, LCSW-A [place]/Clinical Social Work Department Cell: [phone]    Final next level of care: [place] Barriers to Discharge: No Barriers Identified   Patient Goals and CMS Choice Patient states their goals for this hospitalization and ongoing recovery are:: Pt is transitioning to comfort care CMS Medicare.gov Compare Post Acute Care list provided to:: Patient Represenative (must comment)(stan [place]) Choice offered to / list presented to : Adult Children  Discharge Placement              Patient chooses bed at: Other - please specify in the comment section below:([facility]) Patient to be transferred to facility by: [place] Name of family member notified: Bobbye [place] Patient and family notified of of transfer: 03/12/19  Discharge Plan and Services In-house Referral: [facility] Discharge Planning Services: CM Consult Post Acute Care Choice: Durable Medical Equipment, Home Health          DME Arranged: N/A DME Agency: NA Date DME Agency Contacted: 03/02/19 Time DME Agency Contacted: 1222 Representative spoke with at DME Agency: zach HH Arranged: NA [place] Agency: NA Date [place]: 03/02/19 Time [place]: 1223 Representative spoke with at [place]: [place] Determinants of Health ([place]) Interventions     Readmission Risk Interventions No flowsheet data found.

## 2019-03-12 NOTE — Progress Notes (Signed)
Report called and given to RN at [facility].

## 2019-03-14 ENCOUNTER — Telehealth: Payer: Medicare HMO | Admitting: Cardiology

## 2019-03-23 ENCOUNTER — Encounter: Payer: Self-pay | Admitting: Physician Assistant

## 2019-03-23 ENCOUNTER — Other Ambulatory Visit: Payer: Self-pay

## 2019-03-23 ENCOUNTER — Encounter: Payer: Medicare HMO | Admitting: Physician Assistant

## 2019-03-23 NOTE — Progress Notes (Signed)
Cardiology Office Note    Date:  03/23/2019   ID:  Tina Weaver, DOB 07-04-1919, MRN [id number]  PCP:  Lajean Manes, MD  Cardiologist: Larae Grooms, MD EPS: None  No chief complaint on file.   History of Present Illness:  Tina Weaver is a 85 y.o. femaleDate:  03/23/2019   ID:  Tina Weaver, DOB March 27, 1919, MRN [id number]    PCP:  Lajean Manes, MD  Cardiologist:  Larae Grooms, MD  Electrophysiologist:  None   Evaluation Performed:    Chief Complaint:    History of Present Illness:    Tina Weaver is a 85 y.o. female with    85 y.o. female with past medical history of hypertension, chronic diastolic heart failure, hypertensive cardiomyopathy, carotid artery disease and aortic stenosis.  She does not have any prior diagnosis of CAD.  Previous echocardiogram obtained on 03/08/2015 showed EF 65 to 70%, grade 1 DD, severe LVH, moderate aortic stenosis.  She has known prior history of occluded right ICA.  Previous carotid Doppler obtained in June 2017 showed left ICA 40 to 59% disease.  Unchanged when compared to the previous study in 2015.  Admitted with SBO.    She was cleared for surgery by cardiology.  Postop she developed an ileus, diastolic CHF and possible pneumonia.  She remained very weak and deconditioned with very poor oral intake and was transitioned to a hospice facility.   The patient  have symptoms concerning for COVID-19 infection (fever, chills, cough, or new shortness of breath).    Past Medical History:  Diagnosis Date  . CHF (congestive heart failure) ([place])   . DOE (dyspnea on exertion) 09/11/2015  . Hypertension    Past Surgical History:  Procedure Laterality Date  . ABDOMINAL HYSTERECTOMY    . APPENDECTOMY    . LAPAROSCOPY N/A 02/28/2019   Procedure: LAPAROSCOPY DIAGNOSTIC;  Surgeon: Ralene Ok, MD;  Location: [place];  Service: General;  Laterality: N/A;  . LYSIS OF ADHESION N/A 02/28/2019   Procedure: LYSIS OF ADHESION;  Surgeon: Ralene Ok, MD;  Location: [place];  Service: General;  Laterality: N/A;     No outpatient medications have been marked as taking for the 03/23/19 encounter (Appointment) with Imogene Burn, PA-C.     Allergies:   Norvasc [amlodipine], Alendronate, and Fosamax [alendronate sodium]   Social History   Tobacco Use  . Smoking status: Never Smoker  . Smokeless tobacco: Never Used  Substance Use Topics  . Alcohol use: No  . Drug use: Never     Family Hx: The patient's family history includes Cancer (age of onset: 34) in her mother; Cirrhosis in her sister; Heart attack in her brother; Heart attack (age of onset: 69) in her father; Hyperlipidemia in her son and son; Hypertension in her sister; Other in her sister and sister; Stroke in her father.  ROS:   Please see the history of present illness.     All other systems reviewed and are negative.   Prior CV studies:   The following studies were reviewed today:   Echo 03/08/15 Study Conclusions   - Left ventricle: The cavity size was normal. Wall thickness was   increased in a pattern of severe LVH. Systolic function was   vigorous. The estimated ejection fraction was in the range of 65%   to 70%. Wall motion was normal; there were no regional wall   motion abnormalities. Doppler parameters are consistent with   abnormal left ventricular  relaxation (grade 1 diastolic   dysfunction). - Aortic valve: There was moderate stenosis. Valve area (VTI): 1.28   cm^2. Valve area (Vmax): 0.87 cm^2. Valve area (Vmean): 1.33   cm^2. - Mitral valve: Mildly calcified annulus.   Transthoracic echocardiography.  M-mode, complete 2D, spectral Doppler, and color Doppler.  Birthdate:  Patient birthdate: 09-24-1918.  Age:  Patient is 84 yr old.  Sex:  Gender: female. BMI: 26.5 kg/m^2.  Blood pressure:     142/42  Patient status: Inpatient.  Study date:  Study date: 03/08/2015. Study time: 11:42 AM.  Location:  Bedside.     Labs/Other Tests and Data  Reviewed:    EKG: Recent Labs: 03/03/2019: ALT 20; Magnesium 1.9 03/09/2019: BUN 25; Creatinine, Ser 1.08; Hemoglobin 11.0; Platelets 417; Potassium 3.7; Sodium 142   Recent Lipid Panel Lab Results  Component Value Date/Time   TRIG 75 02/28/2019 05:02 AM    Wt Readings from Last 3 Encounters:  03/11/19 111 lb 12.4 oz (50.7 kg)  01/05/19 110 lb 6.4 oz (50.1 kg)  01/05/18 113 lb (51.3 kg)     Objective:    Vital Signs:  There were no vitals taken for this visit.     ASSESSMENT & PLAN:    1.  HOCM    2.  Hypertension    3.  Carotid artery disease Known occluded right internal carotid artery, 40 to 59% disease on left on the previous carotid Doppler in 2017.    4.  Moderate AS Seen on previous echocardiogram in 2016.     5.  Chronic diastolic CHF         [id number] Education: The signs and symptoms of COVID-19 were discussed with the patient and how to seek care for testing (follow up with PCP or arrange E-visit).  The importance of social distancing was discussed today.  Time:   Today, I have spent  minutes with the patient with telehealth technology discussing the above problems.     Medication Adjustments/Labs and Tests Ordered: Current medicines are reviewed at length with the patient today.  Concerns regarding medicines are outlined above.   Tests Ordered: No orders of the defined types were placed in this encounter.   Medication Changes: No orders of the defined types were placed in this encounter.   Follow Up: Sumner Boast, PA-C  03/23/2019 9:28 AM    [facility]   This encounter was created in error - please disregard.

## 2019-03-29 DEATH — deceased

## 2019-03-30 ENCOUNTER — Ambulatory Visit: Payer: Medicare HMO | Admitting: Cardiology
